# Patient Record
Sex: Female | Born: 1990
Health system: Southern US, Community
[De-identification: ages and names within clinical notes are randomized; demographics above are authoritative.]

## PROBLEM LIST (undated history)

## (undated) ENCOUNTER — Inpatient Hospital Stay (HOSPITAL_COMMUNITY): Payer: Self-pay

## (undated) DIAGNOSIS — J302 Other seasonal allergic rhinitis: Secondary | ICD-10-CM

## (undated) DIAGNOSIS — D573 Sickle-cell trait: Secondary | ICD-10-CM

## (undated) DIAGNOSIS — E669 Obesity, unspecified: Secondary | ICD-10-CM

## (undated) DIAGNOSIS — C801 Malignant (primary) neoplasm, unspecified: Secondary | ICD-10-CM

## (undated) DIAGNOSIS — E739 Lactose intolerance, unspecified: Secondary | ICD-10-CM

## (undated) DIAGNOSIS — B009 Herpesviral infection, unspecified: Secondary | ICD-10-CM

## (undated) HISTORY — DX: Lactose intolerance, unspecified: E73.9

## (undated) HISTORY — DX: Sickle-cell trait: D57.3

## (undated) HISTORY — PX: THYROIDECTOMY: SHX17

## (undated) HISTORY — PX: NO PAST SURGERIES: SHX2092

## (undated) HISTORY — DX: Herpesviral infection, unspecified: B00.9

---

## 2012-06-28 ENCOUNTER — Encounter (HOSPITAL_COMMUNITY): Payer: Self-pay | Admitting: *Deleted

## 2012-06-28 ENCOUNTER — Emergency Department (HOSPITAL_COMMUNITY)
Admission: EM | Admit: 2012-06-28 | Discharge: 2012-06-28 | Disposition: A | Discharge status: Home / Self Care | Payer: BC Managed Care – PPO | Attending: Emergency Medicine | Admitting: Emergency Medicine

## 2012-06-28 DIAGNOSIS — L509 Urticaria, unspecified: Secondary | ICD-10-CM

## 2012-06-28 HISTORY — DX: Other seasonal allergic rhinitis: J30.2

## 2012-06-28 MED ORDER — HYDROXYZINE HCL 25 MG PO TABS: 25.0000 mg | ORAL_TABLET | Freq: Three times a day (TID) | ORAL | Status: DC | PRN

## 2012-06-28 MED ORDER — HYDROXYZINE HCL 25 MG PO TABS
25.0000 mg | ORAL_TABLET | Freq: Once | ORAL | Status: AC
  Administered 2012-06-28: 25 mg via ORAL
  Filled 2012-06-28: qty 1

## 2012-06-28 NOTE — ED Provider Notes (Signed)
History   This chart was scribed for Cyndra Numbers, MD by Melba Coon. The patient was seen in room TR11C/TR11C and the patient's care was started at 4:21PM.    CSN: [id number]  Arrival date & time 06/28/12  1518   First MD Initiated Contact with Patient 06/28/12 1541      Chief Complaint  Patient presents with  . Rash    (Consider location/radiation/quality/duration/timing/severity/associated sxs/prior treatment) The history is provided by the patient. No language interpreter was used.   Barbara Moore is a 21 y.o. female who presents to the Emergency Department complaining of persistent, moderate to severe rash to the face, bilateral feet, legs, and abdomen with an onset 2 days ago. Barbara Moore states that the rash started in her face but radiated to the rest of her body. Rash is pruritic but pt has tried not to scratch the affected areas. Pt took OTC antihistamines at home which did not alleviate the symptoms. Barbara Moore states that, recently, school and work related stress have heightened around the time of onset. Reports sore throat; thought that, along her the present hives, it has been seasonal allergies that have been triggering the present symptoms. Denies nausea, vomit, SOB. Hx of chronic hives since childhood; pt states that usually stress or high emotional states will trigger the onset of hives and that usually her hives are not present in the facial area and just goes away. No known allergies to medications. No other pertinent medical symptoms.  Past Medical History  Diagnosis Date  . Seasonal allergies     History reviewed. No pertinent past surgical history.  No family history on file.  History  Substance Use Topics  . Smoking status: Never Smoker   . Smokeless tobacco: Not on file  . Alcohol Use: No    OB History    Grav Para Term Preterm Abortions TAB SAB Ect Mult Living                  Review of Systems  Skin: Positive for rash.    Allergies  Review of  patient's allergies indicates no known allergies.  Home Medications   Current Outpatient Rx  Name Route Sig Dispense Refill  . VITAMIN C PO Oral Take 1 tablet by mouth daily.    Marland Kitchen OVER THE COUNTER MEDICATION Oral Take 1 tablet by mouth every 4 (four) hours as needed. For allergies   otc allergy relief    . OVER THE COUNTER MEDICATION Oral Take 2 tablets by mouth every 6 (six) hours as needed. For allergy or sinus headache   Tylenol sinus      BP 132/77  Pulse 91  Temp 99.2 F (37.3 C) (Oral)  Resp 16  SpO2 98%  Physical Exam GEN: Well-developed, well-nourished female in no distress HEENT: Atraumatic, normocephalic. Oropharynx with erythema but no tonsillar swelling or exudates. EYES: PERRLA BL, no scleral icterus. NECK: Trachea midline, no meningismus CV: regular rate and rhythm. No murmurs, rubs, or gallops PULM: No respiratory distress.  No crackles, wheezes, or rales. GI: soft, non-tender. No guarding, rebound, or tenderness. + bowel sounds  GU: deferred Neuro: cranial nerves grossly 2-12 intact, no abnormalities of strength or sensation, A and O x 3 MSK: Patient moves all 4 extremities symmetrically, no deformity, edema, or injury noted Skin: Few scattered raised erythematous wheals at most 5 mm in diameter over the right cheek.  Patient indicates she has rash elsewhere but legs mainly appear to have evidence of excoriation Psych: no  abnormality of mood  ED Course  Procedures (including critical care time)  DIAGNOSTIC STUDIES: Oxygen Saturation is 98% on room air, normal by my interpretation.    COORDINATION OF CARE:  4:25PM - atarax will be Rx for the pt. Pt is advised to use benadryl at home for any future hive outbreaks. Barbara Moore is ready for d/c.    Labs Reviewed - No data to display No results found.   1. Urticaria       MDM  Patient with a few urticarial lesions and no findings raising concern for emergent rashes such as RMSF, SJS, TEN, or severe  allergic reaction.  Patient felt symptoms likely related to stress.  Treated with hydroxyzine and discharged with Rx for same in good condition.  I personally performed the services described in this documentation, which was scribed in my presence. The recorded information has been reviewed and considered.          Cyndra Numbers, MD 06/29/12 [phone]

## 2012-06-28 NOTE — ED Notes (Signed)
Patient states she has an outbreak of hives that started on Friday.  She has noted areas all over.  She reports she has hx of hives since childhood.

## 2013-02-16 ENCOUNTER — Emergency Department (HOSPITAL_COMMUNITY)
Admission: EM | Admit: 2013-02-16 | Discharge: 2013-02-16 | Disposition: A | Discharge status: Home / Self Care | Payer: BC Managed Care – PPO | Attending: Emergency Medicine | Admitting: Emergency Medicine

## 2013-02-16 DIAGNOSIS — I889 Nonspecific lymphadenitis, unspecified: Secondary | ICD-10-CM

## 2013-02-16 DIAGNOSIS — J302 Other seasonal allergic rhinitis: Secondary | ICD-10-CM

## 2013-02-16 DIAGNOSIS — J309 Allergic rhinitis, unspecified: Secondary | ICD-10-CM | POA: Insufficient documentation

## 2013-02-16 DIAGNOSIS — L049 Acute lymphadenitis, unspecified: Secondary | ICD-10-CM | POA: Insufficient documentation

## 2013-02-16 DIAGNOSIS — J3489 Other specified disorders of nose and nasal sinuses: Secondary | ICD-10-CM | POA: Insufficient documentation

## 2013-02-16 NOTE — ED Notes (Signed)
Pt states she noticed a bump on the R lower side of the back of her head today. States it has happened before in the same spot but has not bothered her.

## 2013-02-16 NOTE — ED Provider Notes (Signed)
History    This chart was scribed for non-physician practitioner Johnnette Gourd working with Celene Kras, MD by Quintella Reichert, ED Scribe. This patient was seen in room WTR9/WTR9 and the patient's care was started at 8:53 PM .   CSN: [id number]  Arrival date & time 02/16/13  2027      Chief Complaint  Patient presents with  . Lump on Head      The history is provided by the patient. No language interpreter was used.    HPI Comments: Barbara Moore is a 22 y.o. female who presents to the Emergency Department complaining of a bump on the right side of her neck that she noticed 7 hours ago.  Pt states that touching the bump causes pain in the area, and also causes a sensation of pressure in the right side of her face and right ear, and a feeling "like someone is pressing something through my throat."  She states that turning her head to the left also causes pain in the area of the bump.  Pt denies drainage, ear pain, fever, chills, neck stiffness, sore throat or any other associated symptoms.  Pt also notes a bump on the left side of her neck that is not painful.  She also reports rhinorrhea, eye itchiness, and mild throat itching.  Pt has seasonal allergies.  She denies h/o any other medical problems.   Past Medical History  Diagnosis Date  . Seasonal allergies     No past surgical history on file.  No family history on file.  History  Substance Use Topics  . Smoking status: Never Smoker   . Smokeless tobacco: Not on file  . Alcohol Use: No    OB History   Grav Para Term Preterm Abortions TAB SAB Ect Mult Living                  Review of Systems  Constitutional: Negative for fever and chills.  HENT: Positive for rhinorrhea. Negative for ear pain and neck stiffness.        Bump on side of neck bilaterally  Eyes: Positive for itching.  All other systems reviewed and are negative.    Allergies  Review of patient's allergies indicates no known allergies.  Home  Medications   Current Outpatient Rx  Name  Route  Sig  Dispense  Refill  . Ascorbic Acid (VITAMIN C PO)   Oral   Take 1 tablet by mouth daily.         . hydrOXYzine (ATARAX/VISTARIL) 25 MG tablet   Oral   Take 1 tablet (25 mg total) by mouth every 8 (eight) hours as needed for itching.   30 tablet   0   . OVER THE COUNTER MEDICATION   Oral   Take 1 tablet by mouth every 4 (four) hours as needed. For allergies   otc allergy relief         . OVER THE COUNTER MEDICATION   Oral   Take 2 tablets by mouth every 6 (six) hours as needed. For allergy or sinus headache   Tylenol sinus           BP 128/74  Pulse 72  Temp(Src) 98.5 F (36.9 C) (Oral)  SpO2 100%  LMP 02/14/2013  Physical Exam  Nursing note and vitals reviewed. Constitutional: She is oriented to person, place, and time. She appears well-developed and well-nourished. No distress.  HENT:  Head: Normocephalic and atraumatic.  Right Ear: External ear normal.  Left Ear: External ear normal.  Nose: Mucosal edema present.  Mouth/Throat: Oropharynx is clear and moist.  Eyes: Conjunctivae and EOM are normal.  Neck: Normal range of motion. Neck supple.  Right side: 7-mm well-circumscribed enlarged lymphnode on posterior cervical chain, tender to palpation.  Firm and mobile. Left side: 3-mm well-circumscribed lymphnode on posterior cervical chain, tender to palpation.  Firm and mobile.  Cardiovascular: Normal rate, regular rhythm and normal heart sounds.   Pulmonary/Chest: Effort normal and breath sounds normal. No respiratory distress. She has no wheezes. She has no rales.  Musculoskeletal: Normal range of motion. She exhibits no edema.  Lymphadenopathy:    She has cervical adenopathy.  Neurological: She is alert and oriented to person, place, and time. No sensory deficit.  Skin: Skin is warm and dry.  Psychiatric: She has a normal mood and affect. Her behavior is normal.    ED Course  Procedures (including  critical care time)  DIAGNOSTIC STUDIES: Oxygen Saturation is 100% on room air, normal by my interpretation.    COORDINATION OF CARE: 9:00 PM-Discussed treatment plan which includes consult with PA with pt at bedside and pt agreed to plan.      Labs Reviewed - No data to display No results found.   1. Lymphadenitis   2. Seasonal allergies       MDM  Allergies, lymphadenitis. Advised allergy pill daily, warm compresses. Afebrile, NAD. Return precautions discussed. Patient states understanding of plan and is agreeable.     I personally performed the services described in this documentation, which was scribed in my presence. The recorded information has been reviewed and is accurate.   Trevor Mace, PA-C 02/16/13 2118

## 2013-02-17 NOTE — ED Provider Notes (Signed)
Medical screening examination/treatment/procedure(s) were performed by non-physician practitioner and as supervising physician I was immediately available for consultation/collaboration.    Celene Kras, MD 02/17/13 [phone]

## 2013-10-24 ENCOUNTER — Emergency Department (HOSPITAL_COMMUNITY)
Admission: EM | Admit: 2013-10-24 | Discharge: 2013-10-24 | Disposition: A | Discharge status: Home / Self Care | Payer: BC Managed Care – PPO | Attending: Emergency Medicine | Admitting: Emergency Medicine

## 2013-10-24 ENCOUNTER — Encounter (HOSPITAL_COMMUNITY): Payer: Self-pay | Admitting: Emergency Medicine

## 2013-10-24 DIAGNOSIS — R5383 Other fatigue: Secondary | ICD-10-CM

## 2013-10-24 DIAGNOSIS — E860 Dehydration: Secondary | ICD-10-CM | POA: Insufficient documentation

## 2013-10-24 DIAGNOSIS — R5381 Other malaise: Secondary | ICD-10-CM | POA: Insufficient documentation

## 2013-10-24 DIAGNOSIS — Z8709 Personal history of other diseases of the respiratory system: Secondary | ICD-10-CM | POA: Insufficient documentation

## 2013-10-24 DIAGNOSIS — R61 Generalized hyperhidrosis: Secondary | ICD-10-CM | POA: Insufficient documentation

## 2013-10-24 DIAGNOSIS — F329 Major depressive disorder, single episode, unspecified: Secondary | ICD-10-CM | POA: Insufficient documentation

## 2013-10-24 DIAGNOSIS — R197 Diarrhea, unspecified: Secondary | ICD-10-CM | POA: Insufficient documentation

## 2013-10-24 DIAGNOSIS — Z79899 Other long term (current) drug therapy: Secondary | ICD-10-CM | POA: Insufficient documentation

## 2013-10-24 DIAGNOSIS — R109 Unspecified abdominal pain: Secondary | ICD-10-CM | POA: Insufficient documentation

## 2013-10-24 DIAGNOSIS — R002 Palpitations: Secondary | ICD-10-CM | POA: Insufficient documentation

## 2013-10-24 DIAGNOSIS — R110 Nausea: Secondary | ICD-10-CM | POA: Insufficient documentation

## 2013-10-24 DIAGNOSIS — F3289 Other specified depressive episodes: Secondary | ICD-10-CM | POA: Insufficient documentation

## 2013-10-24 DIAGNOSIS — R0602 Shortness of breath: Secondary | ICD-10-CM | POA: Insufficient documentation

## 2013-10-24 LAB — CBC WITH DIFFERENTIAL/PLATELET
BASOS ABS: 0 10*3/uL (ref 0.0–0.1)
Basophils Relative: 0 % (ref 0–1)
EOS PCT: 1 % (ref 0–5)
Eosinophils Absolute: 0.1 10*3/uL (ref 0.0–0.7)
HEMATOCRIT: 35.3 % — AB (ref 36.0–46.0)
HEMOGLOBIN: 12.2 g/dL (ref 12.0–15.0)
LYMPHS PCT: 31 % (ref 12–46)
Lymphs Abs: 2.2 10*3/uL (ref 0.7–4.0)
MCH: 27.9 pg (ref 26.0–34.0)
MCHC: 34.6 g/dL (ref 30.0–36.0)
MCV: 80.6 fL (ref 78.0–100.0)
MONO ABS: 0.3 10*3/uL (ref 0.1–1.0)
MONOS PCT: 5 % (ref 3–12)
Neutro Abs: 4.6 10*3/uL (ref 1.7–7.7)
Neutrophils Relative %: 63 % (ref 43–77)
Platelets: 363 10*3/uL (ref 150–400)
RBC: 4.38 MIL/uL (ref 3.87–5.11)
RDW: 14.7 % (ref 11.5–15.5)
WBC: 7.2 10*3/uL (ref 4.0–10.5)

## 2013-10-24 LAB — URINALYSIS, ROUTINE W REFLEX MICROSCOPIC
Bilirubin Urine: NEGATIVE
Glucose, UA: NEGATIVE mg/dL
Hgb urine dipstick: NEGATIVE
Ketones, ur: NEGATIVE mg/dL
Leukocytes, UA: NEGATIVE
NITRITE: NEGATIVE
PROTEIN: NEGATIVE mg/dL
SPECIFIC GRAVITY, URINE: 1.026 (ref 1.005–1.030)
UROBILINOGEN UA: 0.2 mg/dL (ref 0.0–1.0)
pH: 6 (ref 5.0–8.0)

## 2013-10-24 LAB — COMPREHENSIVE METABOLIC PANEL
ALT: 16 U/L (ref 0–35)
AST: 12 U/L (ref 0–37)
Albumin: 4 g/dL (ref 3.5–5.2)
Alkaline Phosphatase: 99 U/L (ref 39–117)
BILIRUBIN TOTAL: 0.3 mg/dL (ref 0.3–1.2)
BUN: 11 mg/dL (ref 6–23)
CALCIUM: 9.5 mg/dL (ref 8.4–10.5)
CO2: 26 meq/L (ref 19–32)
CREATININE: 0.71 mg/dL (ref 0.50–1.10)
Chloride: 100 mEq/L (ref 96–112)
GLUCOSE: 100 mg/dL — AB (ref 70–99)
Potassium: 3.7 mEq/L (ref 3.7–5.3)
Sodium: 140 mEq/L (ref 137–147)
Total Protein: 7.8 g/dL (ref 6.0–8.3)

## 2013-10-24 LAB — TROPONIN I

## 2013-10-24 MED ORDER — SODIUM CHLORIDE 0.9 % IV SOLN
INTRAVENOUS | Status: DC
  Administered 2013-10-24: 09:00:00 via INTRAVENOUS

## 2013-10-24 MED ORDER — SODIUM CHLORIDE 0.9 % IV BOLUS (SEPSIS)
1000.0000 mL | Freq: Once | INTRAVENOUS | Status: AC
  Administered 2013-10-24: 1000 mL via INTRAVENOUS

## 2013-10-24 MED ORDER — ONDANSETRON HCL 4 MG/2ML IJ SOLN
4.0000 mg | Freq: Once | INTRAMUSCULAR | Status: AC
  Administered 2013-10-24: 4 mg via INTRAVENOUS
  Filled 2013-10-24: qty 2

## 2013-10-24 MED ORDER — ONDANSETRON HCL 4 MG PO TABS: 4.0000 mg | ORAL_TABLET | Freq: Three times a day (TID) | ORAL | Status: DC | PRN

## 2013-10-24 NOTE — ED Provider Notes (Signed)
CSN: [id number]     Arrival date & time 10/24/13  0736 History   First MD Initiated Contact with Patient 10/24/13 0750     Chief Complaint  Patient presents with  . possible med reaction from SSRI   . Nausea  . Diarrhea   (Consider location/radiation/quality/duration/timing/severity/associated sxs/prior Treatment) HPI Patient reports she started being treated for depression at her school. She states she was started on Prozac in November. She was taking two 20 mg tablets daily in the afternoon. She reports an hour after she takes them she starts feeling better with improved mood. It does not change her physical feelings such as nausea, vomiting, dizziness. She states she stopped for about 4 weeks in December. She restarted about 10 days ago but instead of starting it to 10 mg once a day like she did when she first started in November she started taking 2 of the 20 mg tablets and yesterday took her first 20 mg tablet at about 3:30 PM. She reports an hour later she started breaking out in a sweat, she felt like her heart was racing and she felt faint. She had nausea without vomiting. She states she felt weak and tense. She states she was hallucinating however she was not hallucinating. She states she felt disoriented meaning that when she was doing her orders at work she forgot what she had done or what she did not do. She states she felt like she was going to pass out. She states that bad  feeling lasted about 2 hours. She drank espresso hoping that would help. She states she's been taking care of her mother who had surgery recently. She has not been sleeping well. She states she usually drinks one 16 ounce coffee a day that she makes herself in the morning. Yesterday however she drank her coffee and had 4 shots of espresso and drank a 16 ounce [employer]. She states she has had diarrhea about 3-4 times a day described as watery for the past 3 days. She has some lower abdominal cramping before she has  diarrhea. She states she did have some shortness of breath with palpitations. She denies cough, sore throat, fever, or chest pain. She also states her legs and arms have been itching with some small red areas on her skin. She denies feeling this way before. She denies being around anybody else that she knows is ill. She states she still has some nausea and feels weak.  Patient reports the Prozac has helped with her depression. She does not feel like she needs to talk to anybody today. She denies suicidal or homicidal ideation.   PCP none [facility]iat[facility]  Past Medical History  Diagnosis Date  . Seasonal allergies    History reviewed. No pertinent past surgical history. History reviewed. No pertinent family history. History  Substance Use Topics  . Smoking status: Never Smoker   . Smokeless tobacco: Not on file  . Alcohol Use: No  [employer] [place] [employer]  OB History   Grav Para Term Preterm Abortions TAB SAB Ect Mult Living                 Review of Systems  All other systems reviewed and are negative.    Allergies  Lactose intolerance (gi)  Home Medications   Current Outpatient Rx  Name  Route  Sig  Dispense  Refill  . FLUoxetine (PROZAC) 10 MG capsule   Oral   Take  10 mg by mouth 2 (two) times daily.          Marland Kitchen ibuprofen (ADVIL,MOTRIN) 200 MG tablet   Oral   Take 200 mg by mouth every 6 (six) hours as needed for pain.         Marland Kitchen ondansetron (ZOFRAN) 4 MG tablet   Oral   Take 1 tablet (4 mg total) by mouth every 8 (eight) hours as needed for nausea or vomiting.   4 tablet   0      ED Triage Vitals  Enc Vitals Group     BP 10/24/13 0743 116/86 mmHg     Pulse Rate 10/24/13 0743 71     Resp 10/24/13 0743 16     Temp 10/24/13 0743 98.1 F (36.7 C)     Temp src --      SpO2 10/24/13 0743 95 %     Weight 10/24/13 0743 285 lb (129.275 kg)     Height 10/24/13 0743 5\' 5"  (1.651 m)     Head Cir --      Peak Flow --       Pain Score --      Pain Loc --      Pain Edu? --      Excl. in GC? --      Vital signs normal   Physical Exam  Nursing note and vitals reviewed. Constitutional: She is oriented to person, place, and time. She appears well-developed and well-nourished.  Non-toxic appearance. She does not appear ill. No distress.  HENT:  Head: Normocephalic and atraumatic.  Right Ear: External ear normal.  Left Ear: External ear normal.  Nose: Nose normal. No mucosal edema or rhinorrhea.  Mouth/Throat: Oropharynx is clear and moist and mucous membranes are normal. No dental abscesses or uvula swelling.  Eyes: Conjunctivae and EOM are normal. Pupils are equal, round, and reactive to light.  Neck: Normal range of motion and full passive range of motion without pain. Neck supple.  Cardiovascular: Normal rate, regular rhythm and normal heart sounds.  Exam reveals no gallop and no friction rub.   No murmur heard. Pulmonary/Chest: Effort normal and breath sounds normal. No respiratory distress. She has no wheezes. She has no rhonchi. She has no rales. She exhibits no tenderness and no crepitus.  Abdominal: Soft. Normal appearance and bowel sounds are normal. She exhibits no distension. There is no tenderness. There is no rebound and no guarding.  Musculoskeletal: Normal range of motion. She exhibits no edema and no tenderness.  Moves all extremities well.   Neurological: She is alert and oriented to person, place, and time. She has normal strength. No cranial nerve deficit.  Skin: Skin is warm, dry and intact. No rash noted. No erythema. No pallor.  Psychiatric: She has a normal mood and affect. Her speech is normal and behavior is normal. Her mood appears not anxious.  Pt seems tense    ED Course  Procedures (including critical care time)  Medications  0.9 %  sodium chloride infusion ( Intravenous New Bag/Given 10/24/13 0842)  ondansetron (ZOFRAN) injection 4 mg (4 mg Intravenous Given 10/24/13 0846)   sodium chloride 0.9 % bolus 1,000 mL (1,000 mLs Intravenous New Bag/Given 10/24/13 0945)    I looked at her pill bottles. Both bottles are the original manufacturer's bottles, one with 10 mg tablets, one with 20 mg tablets. I looked on finder and the 20 mg tablets are Prozac. The instructions on the 10 mg bottles state to take  10 mg once a day for 7 days then increase to 20 mg daily. Patient did not do that taper when she restarted taking her Prozac after being off for 4 weeks. We discussed she needs to not take it today and then when she restarts [the 10 mg dose for a week. She still has nine of the 10 mg tablets in her bottle.  11:30 pt is feeling well, has had her bolus of fluids. Feels ready to go home. We discussed her Prozac. She is not to take it today and start the 10 mg dose tomorrow for a week then increase to the 20 mg dose. However if she feels bad at any point on restarting the Prozac she is to stop taking it. She should let her psychiatrist know if she's unable to tolerate the Prozac. It is unclear whether she had serotonin syndrome because she did have diarrhea for a couple days before the acute symptoms started yesterday. However a lot of her symptoms are suggestive of serotonin syndrome. She however had the symptoms yesterday. Today she does not have tachycardia, fever, dilated pupils, or diaphoresis. She denies taking any over-the-counter medications.  Labs Review Results for orders placed during the hospital encounter of 10/24/13  CBC WITH DIFFERENTIAL      Result Value Range   WBC 7.2  4.0 - 10.5 K/uL   RBC 4.38  3.87 - 5.11 MIL/uL   Hemoglobin 12.2  12.0 - 15.0 g/dL   HCT 38.7 (*) 56.4 - 33.2 %   MCV 80.6  78.0 - 100.0 fL   MCH 27.9  26.0 - 34.0 pg   MCHC 34.6  30.0 - 36.0 g/dL   RDW 95.1  88.4 - 16.6 %   Platelets 363  150 - 400 K/uL   Neutrophils Relative % 63  43 - 77 %   Neutro Abs 4.6  1.7 - 7.7 K/uL   Lymphocytes Relative 31  12 - 46 %   Lymphs Abs 2.2  0.7 - 4.0  K/uL   Monocytes Relative 5  3 - 12 %   Monocytes Absolute 0.3  0.1 - 1.0 K/uL   Eosinophils Relative 1  0 - 5 %   Eosinophils Absolute 0.1  0.0 - 0.7 K/uL   Basophils Relative 0  0 - 1 %   Basophils Absolute 0.0  0.0 - 0.1 K/uL  COMPREHENSIVE METABOLIC PANEL      Result Value Range   Sodium 140  137 - 147 mEq/L   Potassium 3.7  3.7 - 5.3 mEq/L   Chloride 100  96 - 112 mEq/L   CO2 26  19 - 32 mEq/L   Glucose, Bld 100 (*) 70 - 99 mg/dL   BUN 11  6 - 23 mg/dL   Creatinine, Ser 0.63  0.50 - 1.10 mg/dL   Calcium 9.5  8.4 - 01.6 mg/dL   Total Protein 7.8  6.0 - 8.3 g/dL   Albumin 4.0  3.5 - 5.2 g/dL   AST 12  0 - 37 U/L   ALT 16  0 - 35 U/L   Alkaline Phosphatase 99  39 - 117 U/L   Total Bilirubin 0.3  0.3 - 1.2 mg/dL   GFR calc non Af Amer >90  >90 mL/min   GFR calc Af Amer >90  >90 mL/min  URINALYSIS, ROUTINE W REFLEX MICROSCOPIC      Result Value Range   Color, Urine YELLOW  YELLOW   APPearance CLEAR  CLEAR   Specific  Gravity, Urine 1.026  1.005 - 1.030   pH 6.0  5.0 - 8.0   Glucose, UA NEGATIVE  NEGATIVE mg/dL   Hgb urine dipstick NEGATIVE  NEGATIVE   Bilirubin Urine NEGATIVE  NEGATIVE   Ketones, ur NEGATIVE  NEGATIVE mg/dL   Protein, ur NEGATIVE  NEGATIVE mg/dL   Urobilinogen, UA 0.2  0.0 - 1.0 mg/dL   Nitrite NEGATIVE  NEGATIVE   Leukocytes, UA NEGATIVE  NEGATIVE  TROPONIN I      Result Value Range   Troponin I <0.30  <0.30 ng/mL   Laboratory interpretation all normal    Imaging Review No results found.  EKG Interpretation    Date/Time:  Sunday October 24 2013 08:09:00 EST Ventricular Rate:  53 PR Interval:  145 QRS Duration: 86 QT Interval:  404 QTC Calculation: 379 R Axis:   56 Text Interpretation:  Sinus bradycardia Otherwise within normal limits No old tracing to compare Confirmed by Yadriel Kerrigan  MD-I, Tamela Elsayed ([id number]) on 10/24/2013 8:30:39 AM            MDM   1. Nausea   2. Diarrhea   3. Dehydration     New Prescriptions   ONDANSETRON (ZOFRAN) 4 MG  TABLET    Take 1 tablet (4 mg total) by mouth every 8 (eight) hours as needed for nausea or vomiting.    Plan discharge   Devoria Albe, MD, Franz Dell, MD 10/24/13 [phone]

## 2013-10-24 NOTE — ED Notes (Signed)
Pt calm and cooperative, denies SI. Pt reports she took Prozac for 1 month from Nov 2014- December 2014. Pt stopped on her own after 1 month. Pt went back to her pcp and started on prozac 10/14/13. Pt reports for last two days she has had nausea, diarrhea, upset stomach. Pt unsure if she is having side effects of medicine. Reports yesterday she had an episode of heart racing, sweating, feeling faint with nausea.

## 2013-10-24 NOTE — Discharge Instructions (Signed)
Don't take the Prozac today. Tomorrow start back at the 10 mg tab a day for 7 days then go to the 20 mg tab a day. If you feel worse at any time, stop taking the Prozac and let your psychiatrist know you can't tolerate it.  Return to the ED if you feel worse. Avoid milk products until the diarrhea is gone, you can take imodium OTC for diarrhea if needed. Use the zofran for any lingering nausea.

## 2014-02-22 ENCOUNTER — Encounter (HOSPITAL_COMMUNITY): Payer: Self-pay | Admitting: Emergency Medicine

## 2014-02-22 ENCOUNTER — Emergency Department (INDEPENDENT_AMBULATORY_CARE_PROVIDER_SITE_OTHER)
Admission: EM | Admit: 2014-02-22 | Discharge: 2014-02-22 | Disposition: A | Discharge status: Home / Self Care | Payer: 59 | Source: Home / Self Care | Attending: Emergency Medicine | Admitting: Emergency Medicine

## 2014-02-22 DIAGNOSIS — R109 Unspecified abdominal pain: Secondary | ICD-10-CM | POA: Diagnosis present

## 2014-02-22 DIAGNOSIS — R10A0 Flank pain, unspecified side: Secondary | ICD-10-CM | POA: Diagnosis present

## 2014-02-22 LAB — POCT URINALYSIS DIP (DEVICE)
Bilirubin Urine: NEGATIVE
GLUCOSE, UA: NEGATIVE mg/dL
Hgb urine dipstick: NEGATIVE
Ketones, ur: NEGATIVE mg/dL
Leukocytes, UA: NEGATIVE
NITRITE: NEGATIVE
PROTEIN: NEGATIVE mg/dL
Specific Gravity, Urine: 1.025 (ref 1.005–1.030)
UROBILINOGEN UA: 0.2 mg/dL (ref 0.0–1.0)
pH: 6.5 (ref 5.0–8.0)

## 2014-02-22 LAB — POCT PREGNANCY, URINE: PREG TEST UR: NEGATIVE

## 2014-02-22 NOTE — Discharge Instructions (Signed)
Dorene GrebeNatalie,   I think that you are doing well overall. The left sided pain is due to bruising of the fatty tissue, but I feel no other abnormalities. I did not find anything concerning on your abdominal or pelvic exam. Therefore we should continue to monitor this pain. It will likely resolve on its own. If it worsens, becomes associated with fever, or nausea and vomiting, then please return.  Sincerely,  Dr. Clinton SawyerWilliamson

## 2014-02-22 NOTE — ED Notes (Signed)
C/o Right side of back pain radiating to abd area States it hurts to move or use right side  Ice pack, ibuprofen was used as tx

## 2014-02-22 NOTE — ED Provider Notes (Signed)
CSN: [id number]     Arrival date & time 02/22/14  1713 History   First MD Initiated Contact with Patient 02/22/14 1735     Chief Complaint  Patient presents with  . Back Pain   (Consider location/radiation/quality/duration/timing/severity/associated sxs/prior Treatment) HPI  Abdominal and flank pain:  Left sided flank pain, 2 days duration, associated with nausea and indigestion, no vomiting, fever, or dysuria; radiates around right lower back; worsened with moving to the left; alleviated with rest and ibuprofen (took 600 mg in last 24 hours); no recent trauma, no history of kidney stones or pyelonephritis  Not sexually active. No vaginal discharge.   Past Medical History  Diagnosis Date  . Seasonal allergies    History reviewed. No pertinent past surgical history. History reviewed. No pertinent family history. History  Substance Use Topics  . Smoking status: Never Smoker   . Smokeless tobacco: Not on file  . Alcohol Use: No   OB History   Grav Para Term Preterm Abortions TAB SAB Ect Mult Living                 Review of Systems Constipation or diarrhea; otherwise see history of present illness Allergies  Lactose intolerance (gi)  Home Medications   Prior to Admission medications   Medication Sig Start Date End Date Taking? Authorizing Provider  FLUoxetine (PROZAC) 10 MG capsule Take 10 mg by mouth 2 (two) times daily.     Historical Provider, MD  ibuprofen (ADVIL,MOTRIN) 200 MG tablet Take 200 mg by mouth every 6 (six) hours as needed for pain.    Historical Provider, MD  ondansetron (ZOFRAN) 4 MG tablet Take 1 tablet (4 mg total) by mouth every 8 (eight) hours as needed for nausea or vomiting. 10/24/13   Ward GivensIva L Knapp, MD   BP 127/83  Pulse 81  Temp(Src) 98.1 F (36.7 C) (Oral)  Resp 14  SpO2 97%  LMP 02/08/2014 Physical Exam Gen: Obese female, not appearing, pleasant and conversant Back: no CVA tenderness Flank: tenderness of left sided panus without palpable  abnormality of adipose tissue Abd: tenderness to palpation of RLQ and LLQ; abdomen too obese to appreciated masses or liver edge  GU: > External: no lesions > Vagina: no blood in vault > Cervix: no lesion; no mucopurulent d/c; no motion tenderness > Uterus: small, mobile > Adnexa: no masses; non tender    ED Course  Procedures (including critical care time) Labs Review Labs Reviewed  POCT URINALYSIS DIP (DEVICE)  POCT PREGNANCY, URINE    Imaging Review No results found.   MDM   1. Flank pain   2. Abdominal pain    1. Flank pain - bruising of adipose tissue of unclear etiology, no indication for further eval 2. Abdominal pain - no acute abdomen, no evidence of PID, no need for further eval, cont to monitor    Garnetta BuddyEdward V Arcenio Mullaly, MD 02/22/14 2133

## 2014-02-24 NOTE — ED Provider Notes (Signed)
Medical screening examination/treatment/procedure(s) were performed by a resident physician and as supervising physician I was immediately available for consultation/collaboration.  Leslee Homeavid Arwin Bisceglia, M.D.   Reuben Likesavid C Travin Marik, MD 02/24/14 2236

## 2014-09-09 ENCOUNTER — Emergency Department (HOSPITAL_COMMUNITY)
Admission: EM | Admit: 2014-09-09 | Discharge: 2014-09-09 | Disposition: A | Discharge status: Home / Self Care | Payer: 59 | Attending: Emergency Medicine | Admitting: Emergency Medicine

## 2014-09-09 ENCOUNTER — Encounter (HOSPITAL_COMMUNITY): Payer: Self-pay | Admitting: Emergency Medicine

## 2014-09-09 DIAGNOSIS — R197 Diarrhea, unspecified: Secondary | ICD-10-CM | POA: Insufficient documentation

## 2014-09-09 DIAGNOSIS — Z79899 Other long term (current) drug therapy: Secondary | ICD-10-CM | POA: Diagnosis not present

## 2014-09-09 DIAGNOSIS — R350 Frequency of micturition: Secondary | ICD-10-CM | POA: Diagnosis not present

## 2014-09-09 DIAGNOSIS — R609 Edema, unspecified: Secondary | ICD-10-CM | POA: Diagnosis not present

## 2014-09-09 DIAGNOSIS — M7989 Other specified soft tissue disorders: Secondary | ICD-10-CM | POA: Diagnosis present

## 2014-09-09 DIAGNOSIS — Z3202 Encounter for pregnancy test, result negative: Secondary | ICD-10-CM | POA: Diagnosis not present

## 2014-09-09 DIAGNOSIS — R5381 Other malaise: Secondary | ICD-10-CM | POA: Diagnosis not present

## 2014-09-09 DIAGNOSIS — K5900 Constipation, unspecified: Secondary | ICD-10-CM | POA: Insufficient documentation

## 2014-09-09 DIAGNOSIS — L219 Seborrheic dermatitis, unspecified: Secondary | ICD-10-CM | POA: Insufficient documentation

## 2014-09-09 DIAGNOSIS — R5383 Other fatigue: Secondary | ICD-10-CM | POA: Insufficient documentation

## 2014-09-09 LAB — URINALYSIS, ROUTINE W REFLEX MICROSCOPIC
BILIRUBIN URINE: NEGATIVE
Glucose, UA: NEGATIVE mg/dL
HGB URINE DIPSTICK: NEGATIVE
KETONES UR: NEGATIVE mg/dL
Leukocytes, UA: NEGATIVE
NITRITE: NEGATIVE
PH: 7 (ref 5.0–8.0)
Protein, ur: NEGATIVE mg/dL
SPECIFIC GRAVITY, URINE: 1.027 (ref 1.005–1.030)
UROBILINOGEN UA: 1 mg/dL (ref 0.0–1.0)

## 2014-09-09 LAB — TSH: TSH: 0.751 u[IU]/mL (ref 0.350–4.500)

## 2014-09-09 LAB — BASIC METABOLIC PANEL
Anion gap: 12 (ref 5–15)
BUN: 10 mg/dL (ref 6–23)
CALCIUM: 9.7 mg/dL (ref 8.4–10.5)
CO2: 26 mEq/L (ref 19–32)
CREATININE: 0.63 mg/dL (ref 0.50–1.10)
Chloride: 103 mEq/L (ref 96–112)
GLUCOSE: 90 mg/dL (ref 70–99)
Potassium: 4.2 mEq/L (ref 3.7–5.3)
Sodium: 141 mEq/L (ref 137–147)

## 2014-09-09 LAB — CBC WITH DIFFERENTIAL/PLATELET
BASOS PCT: 0 % (ref 0–1)
Basophils Absolute: 0 10*3/uL (ref 0.0–0.1)
EOS ABS: 0.1 10*3/uL (ref 0.0–0.7)
EOS PCT: 1 % (ref 0–5)
HCT: 36 % (ref 36.0–46.0)
HEMOGLOBIN: 12.2 g/dL (ref 12.0–15.0)
LYMPHS ABS: 1.8 10*3/uL (ref 0.7–4.0)
Lymphocytes Relative: 27 % (ref 12–46)
MCH: 27.5 pg (ref 26.0–34.0)
MCHC: 33.9 g/dL (ref 30.0–36.0)
MCV: 81.1 fL (ref 78.0–100.0)
MONOS PCT: 4 % (ref 3–12)
Monocytes Absolute: 0.3 10*3/uL (ref 0.1–1.0)
NEUTROS PCT: 68 % (ref 43–77)
Neutro Abs: 4.5 10*3/uL (ref 1.7–7.7)
Platelets: 332 10*3/uL (ref 150–400)
RBC: 4.44 MIL/uL (ref 3.87–5.11)
RDW: 14.8 % (ref 11.5–15.5)
WBC: 6.7 10*3/uL (ref 4.0–10.5)

## 2014-09-09 LAB — PREGNANCY, URINE: PREG TEST UR: NEGATIVE

## 2014-09-09 NOTE — ED Notes (Addendum)
Pt c/o bilat lower extremity swelling that has been going on for 3-4 months. Pt also c/o weight gain over the past several months.  Pt states that she hasnt had a period in 2 months, "but i know im not pregnant".  Pt also c/o mood swings that have been going on for past 2 months.   Pt's other c/o is dry scalp that has been going on for about 3 months.  Pt states that she has tried different shampoos but havent helped.

## 2014-09-09 NOTE — Discharge Instructions (Signed)
The cause of your symptoms was not identified today in the Emergency Department.  Your thyroid studies were ordered but will not be available right away.  You can follow up with your family doctor, or request your medical records for official results.    Edema Edema is an abnormal buildup of fluids in your bodytissues. Edema is somewhatdependent on gravity to pull the fluid to the lowest place in your body. That makes the condition more common in the legs and thighs (lower extremities). Painless swelling of the feet and ankles is common and becomes more likely as you get older. It is also common in looser tissues, like around your eyes.  When the affected area is squeezed, the fluid may move out of that spot and leave a dent for a few moments. This dent is called pitting.  CAUSES  There are many possible causes of edema. Eating too much salt and being on your feet or sitting for a long time can cause edema in your legs and ankles. Hot weather may make edema worse. Common medical causes of edema include:  Heart failure.  Liver disease.  Kidney disease.  Weak blood vessels in your legs.  Cancer.  An injury.  Pregnancy.  Some medications.  Obesity. SYMPTOMS  Edema is usually painless.Your skin may look swollen or shiny.  DIAGNOSIS  Your health care provider may be able to diagnose edema by asking about your medical history and doing a physical exam. You may need to have tests such as X-rays, an electrocardiogram, or blood tests to check for medical conditions that may cause edema.  TREATMENT  Edema treatment depends on the cause. If you have heart, liver, or kidney disease, you need the treatment appropriate for these conditions. General treatment may include:  Elevation of the affected body part above the level of your heart.  Compression of the affected body part. Pressure from elastic bandages or support stockings squeezes the tissues and forces fluid back into the blood  vessels. This keeps fluid from entering the tissues.  Restriction of fluid and salt intake.  Use of a water pill (diuretic). These medications are appropriate only for some types of edema. They pull fluid out of your body and make you urinate more often. This gets rid of fluid and reduces swelling, but diuretics can have side effects. Only use diuretics as directed by your health care provider. HOME CARE INSTRUCTIONS   Keep the affected body part above the level of your heart when you are lying down.   Do not sit still or stand for prolonged periods.   Do not put anything directly under your knees when lying down.  Do not wear constricting clothing or garters on your upper legs.   Exercise your legs to work the fluid back into your blood vessels. This may help the swelling go down.   Wear elastic bandages or support stockings to reduce ankle swelling as directed by your health care provider.   Eat a low-salt diet to reduce fluid if your health care provider recommends it.   Only take medicines as directed by your health care provider. SEEK MEDICAL CARE IF:   Your edema is not responding to treatment.  You have heart, liver, or kidney disease and notice symptoms of edema.  You have edema in your legs that does not improve after elevating them.   You have sudden and unexplained weight gain. SEEK IMMEDIATE MEDICAL CARE IF:   You develop shortness of breath or chest pain.  You cannot breathe when you lie down.  You develop pain, redness, or warmth in the swollen areas.   You have heart, liver, or kidney disease and suddenly get edema.  You have a fever and your symptoms suddenly get worse. MAKE SURE YOU:   Understand these instructions.  Will watch your condition.  Will get help right away if you are not doing well or get worse. Document Released: 09/23/2005 Document Revised: 02/07/2014 Document Reviewed: 07/16/2013 [facility] Patient Information 2015  [employer] [place]. This information is not intended to replace advice given to you by your health care provider. Make sure you discuss any questions you have with your health care provider. .res

## 2014-09-09 NOTE — ED Notes (Signed)
Pt alert, nad, resp even unlabored, skin pwd, denies needs, ambulates to discharge 

## 2014-09-09 NOTE — ED Provider Notes (Signed)
CSN: [id number]     Arrival date & time 09/09/14  1230 History   First MD Initiated Contact with Patient 09/09/14 1417     Chief Complaint  Patient presents with  . mood swings   . Leg Swelling  . dry scalp      The history is provided by the patient.   [name] presents for 2 months of progressive weight gain, fatigue, swelling in her legs at the end of the day, and dry scalp and dandruff. She reports she often feels bloated. She denies chance of pregnancy as she is not sexually active. She has not had her cycle for last 2 months. She reports intermittent diarrhea and constipation. She reports urinary frequency. Her symptoms are mild to moderate, progressive and worsening. He denies any chest pain, shortness of breath, fevers, vomiting, abdominal pain. She has no known medical problems.  Past Medical History  Diagnosis Date  . Seasonal allergies    History reviewed. No pertinent past surgical history. No family history on file. History  Substance Use Topics  . Smoking status: Never Smoker   . Smokeless tobacco: Not on file  . Alcohol Use: Yes     Comment: social    OB History    No data available     Review of Systems  All other systems reviewed and are negative.     Allergies  Lactose intolerance (gi)  Home Medications   Prior to Admission medications   Medication Sig Start Date End Date Taking? Authorizing Provider  ibuprofen (ADVIL,MOTRIN) 200 MG tablet Take 600 mg by mouth every 6 (six) hours as needed for moderate pain (pain).    Yes Historical Provider, MD  FLUoxetine (PROZAC) 10 MG capsule Take 10 mg by mouth 2 (two) times daily.     Historical Provider, MD  ondansetron (ZOFRAN) 4 MG tablet Take 1 tablet (4 mg total) by mouth every 8 (eight) hours as needed for nausea or vomiting. 10/24/13   Ward GivensIva L Knapp, MD   BP 111/67 mmHg  Pulse 81  Temp(Src) 98.2 F (36.8 C) (Oral)  Resp 19  Wt 318 lb 8 oz (144.471 kg)  SpO2 96%  LMP 07/10/2014 Physical Exam   Constitutional: She is oriented to person, place, and time. She appears well-developed and well-nourished.  HENT:  Head: Normocephalic and atraumatic.  Seborrhea of scalp  Cardiovascular: Normal rate and regular rhythm.   No murmur heard. Pulmonary/Chest: Effort normal and breath sounds normal. No respiratory distress.  Abdominal: Soft. There is no tenderness. There is no rebound and no guarding.  Musculoskeletal: She exhibits no tenderness.  Trace nonpitting edema in BLE  Neurological: She is alert and oriented to person, place, and time.  Skin: Skin is warm and dry.  Psychiatric: She has a normal mood and affect. Her behavior is normal.  Nursing note and vitals reviewed.   ED Course  Procedures (including critical care time) Labs Review Labs Reviewed  PREGNANCY, URINE  URINALYSIS, ROUTINE W REFLEX MICROSCOPIC  CBC WITH DIFFERENTIAL  BASIC METABOLIC PANEL  TSH    Imaging Review No results found.   EKG Interpretation None      MDM   Final diagnoses:  Peripheral edema  Malaise and fatigue    Patient here with fatigue and peripheral edema. Patient is nontoxic on exam. Clinical picture is not consistent with myxedema coma, acute renal failure, congestive heart failure, DVT. Discussed with patient that she needs to follow up with her primary care physician, providing resources for  outpatient follow-up.    Tilden FossaElizabeth Corsica Franson, MD 09/09/14 1556

## 2014-10-18 ENCOUNTER — Emergency Department (HOSPITAL_COMMUNITY)
Admission: EM | Admit: 2014-10-18 | Discharge: 2014-10-19 | Disposition: A | Discharge status: Home / Self Care | Payer: 59 | Attending: Emergency Medicine | Admitting: Emergency Medicine

## 2014-10-18 ENCOUNTER — Encounter (HOSPITAL_COMMUNITY): Payer: Self-pay | Admitting: *Deleted

## 2014-10-18 ENCOUNTER — Emergency Department (HOSPITAL_COMMUNITY): Payer: 59

## 2014-10-18 DIAGNOSIS — Z3202 Encounter for pregnancy test, result negative: Secondary | ICD-10-CM | POA: Insufficient documentation

## 2014-10-18 DIAGNOSIS — R52 Pain, unspecified: Secondary | ICD-10-CM

## 2014-10-18 DIAGNOSIS — Z79899 Other long term (current) drug therapy: Secondary | ICD-10-CM | POA: Insufficient documentation

## 2014-10-18 DIAGNOSIS — M545 Low back pain: Secondary | ICD-10-CM | POA: Insufficient documentation

## 2014-10-18 NOTE — ED Provider Notes (Signed)
CSN: [id number]     Arrival date & time 10/18/14  2100 History  This chart was scribed for non-physician practitioner, Arman FilterGail K Everado Pillsbury, NP, working with Purvis SheffieldForrest Harrison, MD, by [name], ED Scribe. This patient was seen in room WTR7/WTR7 and the patient's care was started at 10:44 PM.   Chief Complaint  Patient presents with  . Leg Pain   The history is provided by the patient. No language interpreter was used.   HPI Comments: Barbara Lazaratalie Darko is a 24 y.o. female who presents to the Emergency Department complaining of constant moderate left sided groin pain that started last night. She states that her pain started last night while she was going to bed and worsened today at work. She denies any falls or trauma. She reports that the twisting pain radiates to her left buttock and lower back. She denies any possible pregnancy.   Past Medical History  Diagnosis Date  . Seasonal allergies    History reviewed. No pertinent past surgical history. No family history on file. History  Substance Use Topics  . Smoking status: Never Smoker   . Smokeless tobacco: Not on file  . Alcohol Use: Yes     Comment: social    OB History    No data available     Review of Systems  Musculoskeletal: Positive for myalgias and back pain. Negative for joint swelling.  Neurological: Negative for weakness and numbness.  All other systems reviewed and are negative.   Allergies  Lactose intolerance (gi)  Home Medications   Prior to Admission medications   Medication Sig Start Date End Date Taking? Authorizing Provider  acetaminophen (TYLENOL) 500 MG tablet Take 1,000 mg by mouth every 6 (six) hours as needed for moderate pain.   Yes Historical Provider, MD  FLUoxetine (PROZAC) 20 MG capsule Take 20 mg by mouth daily.   Yes Historical Provider, MD  ibuprofen (ADVIL,MOTRIN) 200 MG tablet Take 600 mg by mouth every 6 (six) hours as needed for moderate pain (pain).    Yes Historical Provider, MD  Multiple  Vitamin (MULTIVITAMIN WITH MINERALS) TABS tablet Take 1 tablet by mouth daily.   Yes Historical Provider, MD  ondansetron (ZOFRAN) 4 MG tablet Take 1 tablet (4 mg total) by mouth every 8 (eight) hours as needed for nausea or vomiting. Patient not taking: Reported on 10/18/2014 10/24/13   Ward GivensIva L Knapp, MD   LMP 07/18/2014 Physical Exam  Constitutional: She is oriented to person, place, and time. She appears well-developed and well-nourished. No distress.  Morbidly obese  HENT:  Head: Normocephalic and atraumatic.  Neck: Neck supple. No tracheal deviation present.  Cardiovascular: Normal rate.   Pulmonary/Chest: Effort normal. No respiratory distress.  Musculoskeletal: Normal range of motion. She exhibits tenderness.  TTP from theLumbar spine to groin. TTP in the left mid gluteal area.  Exam limited by body habitus  Neurological: She is alert and oriented to person, place, and time.  Skin: Skin is warm and dry.  Psychiatric: She has a normal mood and affect. Her behavior is normal.  Nursing note and vitals reviewed.   ED Course  Procedures (including critical care time) DIAGNOSTIC STUDIES:   COORDINATION OF CARE: 10:48 PM- Pt advised of plan for treatment and pt agrees.  Labs Review Labs Reviewed - No data to display  Imaging Review No results found.   EKG Interpretation None      MDM   Final diagnoses:  None    I personally performed the services described  in this documentation, which was scribed in my presence. The recorded information has been reviewed and is accurate.    Arman Filter, NP 10/18/14 2350  Purvis Sheffield, MD 10/19/14 1134

## 2014-10-18 NOTE — ED Notes (Signed)
Pt reports L thigh and hip pain since last night.  Denies injury at this time.  Pt reports pain is worse with weight bearing and sitting down.  Pt states when she stands up her leg feel like it's "twisted in."

## 2014-10-19 LAB — POC URINE PREG, ED: Preg Test, Ur: NEGATIVE

## 2014-10-19 MED ORDER — OXYCODONE-ACETAMINOPHEN 5-325 MG PO TABS
2.0000 | ORAL_TABLET | Freq: Once | ORAL | Status: AC
  Administered 2014-10-19: 2 via ORAL
  Filled 2014-10-19: qty 2

## 2014-10-19 MED ORDER — KETOROLAC TROMETHAMINE 30 MG/ML IJ SOLN
30.0000 mg | Freq: Once | INTRAMUSCULAR | Status: AC
  Administered 2014-10-19: 30 mg via INTRAMUSCULAR
  Filled 2014-10-19: qty 1

## 2014-10-20 ENCOUNTER — Emergency Department (HOSPITAL_COMMUNITY): Payer: 59

## 2014-10-20 ENCOUNTER — Emergency Department (HOSPITAL_COMMUNITY)
Admission: EM | Admit: 2014-10-20 | Discharge: 2014-10-20 | Disposition: A | Discharge status: Home / Self Care | Payer: 59 | Attending: Emergency Medicine | Admitting: Emergency Medicine

## 2014-10-20 ENCOUNTER — Encounter (HOSPITAL_COMMUNITY): Payer: Self-pay | Admitting: Emergency Medicine

## 2014-10-20 DIAGNOSIS — M1990 Unspecified osteoarthritis, unspecified site: Secondary | ICD-10-CM | POA: Insufficient documentation

## 2014-10-20 DIAGNOSIS — B349 Viral infection, unspecified: Secondary | ICD-10-CM | POA: Diagnosis not present

## 2014-10-20 DIAGNOSIS — Z79899 Other long term (current) drug therapy: Secondary | ICD-10-CM | POA: Diagnosis not present

## 2014-10-20 DIAGNOSIS — M25552 Pain in left hip: Secondary | ICD-10-CM

## 2014-10-20 DIAGNOSIS — M79605 Pain in left leg: Secondary | ICD-10-CM

## 2014-10-20 DIAGNOSIS — R109 Unspecified abdominal pain: Secondary | ICD-10-CM

## 2014-10-20 LAB — I-STAT CG4 LACTIC ACID, ED: LACTIC ACID, VENOUS: 0.69 mmol/L (ref 0.5–2.2)

## 2014-10-20 LAB — URINALYSIS, ROUTINE W REFLEX MICROSCOPIC
BILIRUBIN URINE: NEGATIVE
Glucose, UA: NEGATIVE mg/dL
Hgb urine dipstick: NEGATIVE
Ketones, ur: NEGATIVE mg/dL
Leukocytes, UA: NEGATIVE
Nitrite: NEGATIVE
PH: 6 (ref 5.0–8.0)
Protein, ur: NEGATIVE mg/dL
Specific Gravity, Urine: 1.022 (ref 1.005–1.030)
Urobilinogen, UA: 1 mg/dL (ref 0.0–1.0)

## 2014-10-20 LAB — COMPREHENSIVE METABOLIC PANEL
ALBUMIN: 3.7 g/dL (ref 3.5–5.2)
ALT: 18 U/L (ref 0–35)
AST: 15 U/L (ref 0–37)
Alkaline Phosphatase: 101 U/L (ref 39–117)
Anion gap: 11 (ref 5–15)
BUN: 11 mg/dL (ref 6–23)
CALCIUM: 9.4 mg/dL (ref 8.4–10.5)
CHLORIDE: 105 meq/L (ref 96–112)
CO2: 23 mmol/L (ref 19–32)
CREATININE: 0.75 mg/dL (ref 0.50–1.10)
GFR calc Af Amer: >90 mL/min (ref >90)
GLUCOSE: 89 mg/dL (ref 70–99)
POTASSIUM: 4 mmol/L (ref 3.5–5.1)
Sodium: 139 mmol/L (ref 135–145)
TOTAL PROTEIN: 7.4 g/dL (ref 6.0–8.3)
Total Bilirubin: 0.4 mg/dL (ref 0.3–1.2)

## 2014-10-20 LAB — CBC WITH DIFFERENTIAL/PLATELET
BASOS PCT: 0 % (ref 0–1)
Basophils Absolute: 0 10*3/uL (ref 0.0–0.1)
Eosinophils Absolute: 0.1 10*3/uL (ref 0.0–0.7)
Eosinophils Relative: 1 % (ref 0–5)
HEMATOCRIT: 35.2 % — AB (ref 36.0–46.0)
Hemoglobin: 12.1 g/dL (ref 12.0–15.0)
LYMPHS ABS: 1.8 10*3/uL (ref 0.7–4.0)
Lymphocytes Relative: 25 % (ref 12–46)
MCH: 27.8 pg (ref 26.0–34.0)
MCHC: 34.4 g/dL (ref 30.0–36.0)
MCV: 80.7 fL (ref 78.0–100.0)
MONO ABS: 0.3 10*3/uL (ref 0.1–1.0)
MONOS PCT: 4 % (ref 3–12)
Neutro Abs: 5.2 10*3/uL (ref 1.7–7.7)
Neutrophils Relative %: 70 % (ref 43–77)
PLATELETS: 373 10*3/uL (ref 150–400)
RBC: 4.36 MIL/uL (ref 3.87–5.11)
RDW: 15.1 % (ref 11.5–15.5)
WBC: 7.4 10*3/uL (ref 4.0–10.5)

## 2014-10-20 LAB — LIPASE, BLOOD: Lipase: 22 U/L (ref 11–59)

## 2014-10-20 LAB — RAPID STREP SCREEN (MED CTR MEBANE ONLY): Streptococcus, Group A Screen (Direct): NEGATIVE

## 2014-10-20 MED ORDER — TRAMADOL HCL 50 MG PO TABS
50.0000 mg | ORAL_TABLET | Freq: Once | ORAL | Status: AC
  Administered 2014-10-20: 50 mg via ORAL
  Filled 2014-10-20: qty 1

## 2014-10-20 MED ORDER — ACETAMINOPHEN 325 MG PO TABS
650.0000 mg | ORAL_TABLET | Freq: Once | ORAL | Status: AC
  Administered 2014-10-20: 650 mg via ORAL
  Filled 2014-10-20: qty 2

## 2014-10-20 MED ORDER — ACETAMINOPHEN 325 MG PO TABS
650.0000 mg | ORAL_TABLET | Freq: Once | ORAL | Status: DC
  Filled 2014-10-20: qty 2

## 2014-10-20 MED ORDER — TRAMADOL HCL 50 MG PO TABS: 50.0000 mg | ORAL_TABLET | Freq: Two times a day (BID) | ORAL | Status: DC | PRN

## 2014-10-20 MED ORDER — SODIUM CHLORIDE 0.9 % IV BOLUS (SEPSIS)
500.0000 mL | Freq: Once | INTRAVENOUS | Status: AC
  Administered 2014-10-20: 500 mL via INTRAVENOUS

## 2014-10-20 NOTE — Progress Notes (Signed)
*  PRELIMINARY RESULTS* Vascular Ultrasound Left lower extremity venous duplex has been completed.  Preliminary findings: no evidence of DVT in visualized veins.  Farrel DemarkJill Eunice, RDMS, RVT  10/20/2014, 5:27 PM

## 2014-10-20 NOTE — ED Provider Notes (Signed)
CSN: [id number]     Arrival date & time 10/20/14  1331 History  This chart was scribed for non-physician practitioner, Fayrene HelperBowie Lyana Asbill, PA-C, working with Vida RollerBrian D Miller, MD by Charline BillsEssence Howell, ED Scribe. This patient was seen in room TR05C/TR05C and the patient's care was started at 4:00 PM.   Chief Complaint  Patient presents with  . Hip Pain   The history is provided by the patient. No language interpreter was used.   HPI Comments: Barbara Moore is a 24 y.o. female who presents to the Emergency Department complaining of constant L hip pain that radiates down L leg onset 3 days ago. Pt denies injury. [facility]. XRs were obtained that were normal; pt was discharged with Percocet and Flexeril without relief. She denies SOB, fever. Pt denies recent travel or surgeries. No oral BC. No prior hx of PE/DVT, hemoptysis, calf pain or leg swelling.  Pt does not know why her pain persisted.  No numbness.  Does admit to having a job which requires prolong sitting.  No worsening back pain aside from her usual back pain.  No rash.  Past Medical History  Diagnosis Date  . Seasonal allergies    History reviewed. No pertinent past surgical history. History reviewed. No pertinent family history. History  Substance Use Topics  . Smoking status: Never Smoker   . Smokeless tobacco: Not on file  . Alcohol Use: Yes     Comment: social    OB History    No data available     Review of Systems  Constitutional: Negative for fever.  Respiratory: Negative for shortness of breath.   Musculoskeletal: Positive for arthralgias. Negative for joint swelling.   Allergies  Lactose intolerance (gi)  Home Medications   Prior to Admission medications   Medication Sig Start Date End Date Taking? Authorizing Provider  acetaminophen (TYLENOL) 500 MG tablet Take 1,000 mg by mouth every 6 (six) hours as needed for moderate pain.    Historical Provider, MD  FLUoxetine (PROZAC) 20 MG  capsule Take 20 mg by mouth daily.    Historical Provider, MD  ibuprofen (ADVIL,MOTRIN) 200 MG tablet Take 600 mg by mouth every 6 (six) hours as needed for moderate pain (pain).     Historical Provider, MD  Multiple Vitamin (MULTIVITAMIN WITH MINERALS) TABS tablet Take 1 tablet by mouth daily.    Historical Provider, MD  ondansetron (ZOFRAN) 4 MG tablet Take 1 tablet (4 mg total) by mouth every 8 (eight) hours as needed for nausea or vomiting. Patient not taking: Reported on 10/18/2014 10/24/13   Ward GivensIva L Knapp, MD   Triage Vitals: BP 131/69 mmHg  Pulse 115  Temp(Src) 99 F (37.2 C) (Oral)  Resp 18  Ht 5\' 6"  (1.676 m)  Wt 300 lb (136.079 kg)  BMI 48.44 kg/m2  SpO2 100%  LMP 07/18/2014 Physical Exam  Constitutional: She is oriented to person, place, and time. She appears well-developed and well-nourished. No distress.  HENT:  Head: Normocephalic and atraumatic.  Eyes: Conjunctivae and EOM are normal.  Neck: Neck supple.  Cardiovascular: Normal rate.   Pulmonary/Chest: Effort normal.  Musculoskeletal: Normal range of motion.  Intact patellar reflexes. No foot drop. L knee pain with ambulation. Tenderness to palpation down whole L leg. Full ROM on L knee and L ankle. L leg is not warm to touch. No redness noted.  Neurological: She is alert and oriented to person, place, and time.  Skin: Skin  is warm and dry.  Psychiatric: She has a normal mood and affect. Her behavior is normal.  Nursing note and vitals reviewed.  ED Course  Procedures (including critical care time) DIAGNOSTIC STUDIES: Oxygen Saturation is 100% on RA, normal by my interpretation.    COORDINATION OF CARE: 4:04 PM-pt with persistent L leg/thigh pain without specific injury.  She is NVI.  She is morbidly obese, thus difficult to fully evaluate.  She does not have any risk factors for DVT, however with the persistent sxs, not resolved despite pain medication and a prior negative xray i discussed performing DVT study and pt  agrees.  Discussed treatment plan with pt at bedside and pt agreed to plan.   4:31 PM Care discussed with oncoming provider who will f/u on DVT study.  Pt is tachycardic, however no signs of infection and no c/o cp or sob.  Will monitor.   Labs Review Labs Reviewed - No data to display  Imaging Review Dg Lumbar Spine Complete  10/19/2014   CLINICAL DATA:  Low back pain. Pain radiating down left hip and buttock. No known injury.  EXAM: Lumbar complete.  4+ views  COMPARISON:  None.  FINDINGS: The alignment is maintained. Vertebral body heights are normal. There is no listhesis. The posterior elements are intact. Disc spaces are preserved. No fracture. Sacroiliac joints are symmetric and normal.  IMPRESSION: Unremarkable radiographs of the lumbar spine. No acute bony abnormality.   Electronically Signed   By: Rubye Oaks M.D.   On: 10/19/2014 01:07     EKG Interpretation None      MDM   Final diagnoses:  None    BP 131/69 mmHg  Pulse 115  Temp(Src) 99 F (37.2 C) (Oral)  Resp 18  Ht  (1.676 m)  Wt 300 lb (136.079 kg)  BMI 48.44 kg/m2  SpO2 100%  LMP 07/18/2014   I personally performed the services described in this documentation, which was scribed in my presence. The recorded information has been reviewed and is accurate.    Fayrene Helper, PA-C 10/20/14 1610  Vida Roller, MD 10/21/14 [phone]

## 2014-10-20 NOTE — Discharge Instructions (Signed)
Please call your doctor for a followup appointment within 24-48 hours. When you talk to your doctor please let them know that you were seen in the emergency department and have them acquire all of your records so that they can discuss the findings with you and formulate a treatment plan to fully care for your new and ongoing problems. Please follow-up with orthopedics Please take medications as prescribed and on a full stomach Please discontinue Percocet dispose of them in a proper manner This take medications as prescribed, tramadol-while taking medications there is to be no drinking alcohol, driving, operating any heavy machinery for this is a controlled substance. Please massage as a hot ointment apply warm compressions Please continue to monitor symptoms closely and if symptoms are to worsen or change (fever greater than 101, chills, sweating, nausea, vomiting, chest pain, shortness of breathe, difficulty breathing, weakness, numbness, tingling, worsening or changes to pain pattern, fall, injury, loss of sensation, weakness, redness to the joint, red streaks, skin changes, hot to touch) please report back to the Emergency Department immediately.   Arthritis, Nonspecific Arthritis is inflammation of a joint. This usually means pain, redness, warmth or swelling are present. One or more joints may be involved. There are a number of types of arthritis. Your caregiver may not be able to tell what type of arthritis you have right away. CAUSES  The most common cause of arthritis is the wear and tear on the joint (osteoarthritis). This causes damage to the cartilage, which can break down over time. The knees, hips, back and neck are most often affected by this type of arthritis. Other types of arthritis and common causes of joint pain include:  Sprains and other injuries near the joint. Sometimes minor sprains and injuries cause pain and swelling that develop hours later.  Rheumatoid arthritis. This  affects hands, feet and knees. It usually affects both sides of your body at the same time. It is often associated with chronic ailments, fever, weight loss and general weakness.  Crystal arthritis. Gout and pseudo gout can cause occasional acute severe pain, redness and swelling in the foot, ankle, or knee.  Infectious arthritis. Bacteria can get into a joint through a break in overlying skin. This can cause infection of the joint. Bacteria and viruses can also spread through the blood and affect your joints.  Drug, infectious and allergy reactions. Sometimes joints can become mildly painful and slightly swollen with these types of illnesses. SYMPTOMS   Pain is the main symptom.  Your joint or joints can also be red, swollen and warm or hot to the touch.  You may have a fever with certain types of arthritis, or even feel overall ill.  The joint with arthritis will hurt with movement. Stiffness is present with some types of arthritis. DIAGNOSIS  Your caregiver will suspect arthritis based on your description of your symptoms and on your exam. Testing may be needed to find the type of arthritis:  Blood and sometimes urine tests.  X-ray tests and sometimes CT or MRI scans.  Removal of fluid from the joint (arthrocentesis) is done to check for bacteria, crystals or other causes. Your caregiver (or a specialist) will numb the area over the joint with a local anesthetic, and use a needle to remove joint fluid for examination. This procedure is only minimally uncomfortable.  Even with these tests, your caregiver may not be able to tell what kind of arthritis you have. Consultation with a specialist (rheumatologist) may be helpful. TREATMENT  Your caregiver will discuss with you treatment specific to your type of arthritis. If the specific type cannot be determined, then the following general recommendations may apply. Treatment of severe joint pain  includes:  Rest.  Elevation.  Anti-inflammatory medication (for example, ibuprofen) may be prescribed. Avoiding activities that cause increased pain.  Only take over-the-counter or prescription medicines for pain and discomfort as recommended by your caregiver.  Cold packs over an inflamed joint may be used for 10 to 15 minutes every hour. Hot packs sometimes feel better, but do not use overnight. Do not use hot packs if you are diabetic without your caregiver's permission.  A cortisone shot into arthritic joints may help reduce pain and swelling.  Any acute arthritis that gets worse over the next 1 to 2 days needs to be looked at to be sure there is no joint infection. Long-term arthritis treatment involves modifying activities and lifestyle to reduce joint stress jarring. This can include weight loss. Also, exercise is needed to nourish the joint cartilage and remove waste. This helps keep the muscles around the joint strong. HOME CARE INSTRUCTIONS   Do not take aspirin to relieve pain if gout is suspected. This elevates uric acid levels.  Only take over-the-counter or prescription medicines for pain, discomfort or fever as directed by your caregiver.  Rest the joint as much as possible.  If your joint is swollen, keep it elevated.  Use crutches if the painful joint is in your leg.  Drinking plenty of fluids may help for certain types of arthritis.  Follow your caregiver's dietary instructions.  Try low-impact exercise such as:  Swimming.  Water aerobics.  Biking.  Walking.  Morning stiffness is often relieved by a warm shower.  Put your joints through regular range-of-motion. SEEK MEDICAL CARE IF:   You do not feel better in 24 hours or are getting worse.  You have side effects to medications, or are not getting better with treatment. SEEK IMMEDIATE MEDICAL CARE IF:   You have a fever.  You develop severe joint pain, swelling or redness.  Many joints are  involved and become painful and swollen.  There is severe back pain and/or leg weakness.  You have loss of bowel or bladder control. Document Released: 10/31/2004 Document Revised: 12/16/2011 Document Reviewed: 11/16/2008 [facility] Patient Information 2015 [place], [place]. This information is not intended to replace advice given to you by your health care provider. Make sure you discuss any questions you have with your health care provider.   Emergency Department Resource Guide 1) Find a Doctor and Pay Out of Pocket Although you won't have to find out who is covered by your insurance plan, it is a good idea to ask around and get recommendations. You will then need to call the office and see if the doctor you have chosen will accept you as a new patient and what types of options they offer for patients who are self-pay. Some doctors offer discounts or will set up payment plans for their patients who do not have insurance, but you will need to ask so you aren't surprised when you get to your appointment.  2) Contact Your Local Health Department Not all health departments have doctors that can see patients for sick visits, but many do, so it is worth a call to see if yours does. If you don't know where your local health department is, you can check in your phone book. The CDC also has a tool to help you locate your  state's health department, and many state websites also have listings of all of their local health departments.  3) Find a Walk-in Clinic If your illness is not likely to be very severe or complicated, you may want to try a walk in clinic. These are popping up all over the country in pharmacies, drugstores, and shopping centers. They're usually staffed by nurse practitioners or physician assistants that have been trained to treat common illnesses and complaints. They're usually fairly quick and inexpensive. However, if you have serious medical issues or chronic medical problems, these are  probably not your best option.  No Primary Care Doctor: - Call Health Connect at  [phone] - they can help you locate a primary care doctor that  accepts your insurance, provides certain services, etc. - Physician Referral Service- [phone]  Chronic Pain Problems: Organization         Address  Phone   Notes  [facility]  [phone] Patients need to be referred by their primary care doctor.   Medication Assistance: Organization         Address  Phone   Notes  [facility] [place] Medication [facility] [address] [place], [place] [address] [phone] --Must be a resident of [facility] [place] -- Must have NO insurance coverage whatsoever (no Medicaid/ Medicare, etc.) -- The pt. MUST have a primary care doctor that directs their care regularly and follows them in the community   MedAssist  [phone]   [employer]  [phone]) [phone]    Agencies that provide inexpensive medical care: Organization         Address  Phone   Notes  [facility]  [phone]   [facility]    [phone]   [facility] [address] [place], [place] [address] [phone]   [facility] [address], [place] [phone]   Planned Parenthood    [phone]   [facility]    [phone]   [facility]  [address], [place] Phone:  [phone], Fax:  [phone]) [phone] Hours of Operation:  9 am - 6 pm, M-F.  Also accepts Medicaid/Medicare and self-pay.  [facility]  [address], [place] Phone: [phone], Fax: [phone]. Hours of Operation:  8:30 am - 5:30 pm, M-F.  Also accepts Medicaid and self-pay.  [facility] [place] [address], [place] Phone: [phone]) [phone]   [facility] [address], [place], [place] [phone], Ext. [phone] Mondays & Thursdays:  7-9 AM.  First 15 patients are seen on a first come, first serve basis.    Medicaid-accepting [facility] Providers:  Organization         Address  Phone   Notes  [facility] [address], [place] [phone] Also accepts self-pay patients.  [facility] [address], [place]  [phone]) [phone]   [facility] [address], [place] [phone]   [facility] [address] [place] [address], [place] [phone]   Renaye RakersVeita Bland [address], [place]   [phone] Only accepts [place] Access [place] patients after they have their name applied to their card.   Self-Pay (no insurance) in [facility]:  Organization  Address  Phone   Notes  Sickle Cell Patients, [facility] Yoe [phone]   [facility] [place] [phone]   [facility] [address]  [place], [address], [place] [phone]   [facility]/Dr. Osei-Bonsu  [address], [place] or [address], [place] [phone] Phone number for both [place] and [place] locations is the same.  [facility] [address], [place] [phone]   [facility] [address], [place] or [address] [phone]   [facility] [address] [place] [phone], phone; [phone], fax Sees patients 1st and 3rd Saturday of every month.  Must not qualify for public or private insurance (i.e. Medicaid, Medicare, [place] Health Choice, Veterans' Benefits)  Household income should be no more than 200% of the poverty level The clinic cannot treat you if you are pregnant or think you are pregnant  Sexually transmitted diseases are not treated at the clinic.     Dental Care: Organization         Address  Phone  Notes  [facility] Department of [facility] [phone] Accepts children up to age 1 who are enrolled in [place] or [place]; pregnant women with a Medicaid card; and children who have applied for Medicaid or [place] Health Choice, but were declined, whose parents can pay a reduced fee at time of service.  [facility] Department of [facility]  [address], [place] [phone] Accepts children up to age 2 who are enrolled in [place] or [place]; pregnant women with a Medicaid card; and children who have applied for Medicaid or [place] Health Choice, but were declined, whose parents can pay a reduced fee at time of service.  [facility] Access PROGRAM  [place] [phone] Patients are seen by appointment only. Walk-ins are not accepted. [place] will see patients 86 years of age and older. Monday - Tuesday (8am-5pm) Most Wednesdays (8:30-5pm) $30 per visit, cash only  [facility] Adult Dental Access PROGRAM  [address], [facility] [phone] Patients are seen by appointment only. Walk-ins are not accepted. [place] will see patients 56 years of age and older. One Wednesday Evening (Monthly: Volunteer Based).  $30 per visit, cash only  [place]  [phone] for adults; Children under age 94, call Graduate Pediatric Dentistry at [phone]. Children aged 26-14, please call [phone] to request a pediatric application.  Dental services are provided in all areas of dental care including fillings, crowns and bridges, complete and partial dentures, implants, gum treatment, root canals, and extractions. Preventive care is also provided. Treatment is provided to both adults and children. Patients are selected via a lottery and there is often a waiting  list.   [facility] [address], [place]  [phone] [email or web address]   [facility] [address] [place], [place] [phone], Ext. [phone] Second and Fourth Thursday of each month, opens at 6:30 AM; Clinic ends at 9 AM.  Patients are seen on a first-come first-served basis, and a limited number are seen during each clinic.   [facility]  [address] [place], [place]  [place], [place] [phone]   Eligibility Requirements You must have lived in [place], [place], or [place] counties for at least the last three months.   You cannot be eligible for state or federal sponsored [employer], including [employer], [place], or [employer].   You generally cannot be eligible for healthcare insurance through your employer.    How to apply: Eligibility screenings are held every Tuesday and Wednesday afternoon from 1:00 pm until 4:00 pm. You do not need an appointment for the interview!  [facility] [address], [place], [place]   [place]  [place] Department  [place]  [phone]    Behavioral Health Resources in the Community: Intensive Outpatient Programs Organization         Address  Phone  Notes  Raemon Elkins. [address], [place], [place] [phone]   [facility] Outpatient [address], [place], [place]   ADS: Alcohol & Drug Svcs [address], [place], [place]   [place] [address],  [place], [place] or [phone]   Substance Abuse Resources Organization         Address  Phone  Notes  Alcohol and Drug Services  [phone]   [place]  4697359287   The [place]   Chinita Pester  [phone]   Residential & Outpatient Substance Abuse Program   [phone]   Psychological Services Organization         Address  Phone  Notes  [facility] [place]  [place]  [phone]   [place] [address] [place] or [phone]    Mobile Crisis Teams Organization         Address  Phone  Notes  [facility], Mobile Crisis Care Unit  [phone]   [facility]  [address] [place], [place]   [place] [address] [place] [phone]    Self-Help/Support Groups Organization         Address  Phone             Notes  [place]. of [place] - variety of support groups  [place] Call for more information  Narcotics Anonymous (NA), [facility] [address], [employer] [place]  2 meetings at this location   Special educational needs teacher         Address  Phone  Notes  ASAP Residential Treatment [place],    [place]  [phone]   [facility]  [address] [place] 177939, [place], [place]   [place]hr [place], [place] [phone] Admissions: 8am-3pm M-F  Incentives Substance [place] [address],    [place], [place] [phone]   [facility] [address] Jadene Pierini [place], [place]   [facility] [address],  [place], [place]   [facility]ight Programs - Intensive Outpatient [address], [place], [place]   [facility] ([place].,  [place], [place] or [phone]   [facility] Services (RTS) [address] [place], [place] Accepts Medicaid  [facility] [place] [address],  [place] 1-8626420987 Substance Abuse/Addiction Treatment   [facility] Resources Organization  Address  Phone  Notes  [facility]  [phone]) [phone]   Angie FavaJulie Brannon, PhD [address], [address] [place], [place]   [phone] or [phone]   [facility] [place]   [address] [place], [place] [phone]) [phone]   [facility] [address], [place], [place] [phone] Insurance/Medicaid/sponsorship through [facility]  [facility] Families [address]                                    [place], [place] [phone] Therapy/tele-psych/case  [facility] [address].   [place], [place] [phone]    Dr. Lolly MustacheArfeen  [phone]   [facility] [place]  [employer] [facility] 1) [address], [place] 2) [address], [place] 3)  [address], [place] [phone]) [phone]  [phone]) [phone]   [facility] Child Abuse Hotline [phone]) [phone] or [phone]) [phone] (After Hours)

## 2014-10-20 NOTE — ED Provider Notes (Signed)
Discussed case with Fayrene Helper, PA-C. Transfer of care to this provider at change in shift.  Barbara Moore is a 24 y/o F with no known significant PMHx presenting to the ED with left hip pain that started abruptly approximately 3-4 days ago. No traumatic factors noted.  Patient was seen in the ED setting on 10/18/2014 for the same with negative plain films and negative urine pregnancy test - discharged with Percocets and flexeril. Patient reported that these medications do not aid in relief.   Plan: Doppler of lower extremity. If negative patient can be discharged.   *PRELIMINARY RESULTS* Vascular Ultrasound Left lower extremity venous duplex has been completed. Preliminary findings: no evidence of DVT in visualized veins.  Farrel Demark, RDMS, RVT 10/20/2014, 5:27 PM  Doppler of left lower extremity negative for acute DVT.  Medications  sodium chloride 0.9 % bolus 500 mL (not administered)  traMADol (ULTRAM) tablet 50 mg (50 mg Oral Given 10/20/14 1708)  acetaminophen (TYLENOL) tablet 650 mg (650 mg Oral Given 10/20/14 1903)    Filed Vitals:   10/20/14 1353 10/20/14 1751 10/20/14 1815  BP: 131/69 125/76   Pulse: 115 107 104  Temp: 99 F (37.2 C)  101.4 F (38.6 C)  TempSrc: Oral    Resp: 18 18   Height:  (1.676 m)    Weight: 300 lb (136.079 kg)    SpO2: 100% 100%    6:04 PM This provider spoke with patient. Discussed images in great detail. Patient reported that she's been having this pain for approximately 3-4 days localized left hip described as an aching sensation. Reported that she did start a new job where she is sitting approximate 10 hours per day. Patient is an obese individual. Mild discomfort upon palpation to the left acetabulum and left buttock. Full range of motion to left hip identified. Negative swelling noted to the lower extremities bilaterally. Negative pitting edema. Pulses palpable and strong. Strength intact. Negative focal neurological deficits. Negative  signs of ischemia. Discussed with patient tachycardia upon arrival to the ED with a heart rate of 115 bpm-has decreased while in ED setting to 107 bpm. Patient denied chest pain, shortness of breath, difficulty breathing, weakness, numbness, tingling, birth control, long travels, leg swelling. Suspicion to be possible beginnings of arthritis in the left hip. Doubt septic joint. Doubt PE. Patient stable, afebrile. Patient not septic appearing. Discharged patient. Discharged patient with small dose of tramadol, patient requesting tramadol secondary for this medication working best-discussed course, precautions, disposal technique. Referred [facility] to [facility] and orthopedics. Discussed with patient to rest and stay hydrated. Discussed with patient to avoid any physical strenuous activity. Discussed with patient to perform oral therapy. Discussed with patient to closely monitor symptoms and if symptoms are to worsen or change to report back to the ED - strict return instructions given.  Patient agreed to plan of care, understood, all questions answered.   6:48 PM upon discharge, vital showed the patient had a fever of 101.14F. This provider reassess the patient. Patient starts explaining that she has been having chills since last night. Reported that she's been having some mild throat/neck discomfort that started yesterday. Reported that she's been having some mild abdominal pain. Stated that she has been expressing some mild nasal congestion. Denied chest pain, shortness of breath, difficulty breathing, cough, ear pain, blurred vision, sudden loss of vision, eye complaints, sore throat, difficulty swallowing, nausea, vomiting, diarrhea, melena, and he is here, dysuria, vaginal discharge or abnormal vaginal bleeding.  Heart rate and rhythm normal. Lungs good auscultation to upper lower lobes bilaterally. Neck supple full range of motion-negative cervical lymphadenopathy meningeal signs identified.  Negative trismus. Uvula midline with symmetrical elevation-negative uvula swelling. Negative findings of erythema, inflammation lesions sores, exudate identified to the posterior oropharynx and tonsils bilaterally. Obese. Bowel sounds normoactive in all 4 quadrants. Abdomen soft upon palpation with tenderness upon palpation to abdomen all over - generalized discomfort. Negative swelling, erythema, inflammation, lesions, sores, warmth upon palpation to the left hip. Mild discomfort upon palpation to the lumbar spine.  CBC negative elevated leukocytosis. Hemoglobin 12.1, hematocrit 35.2. CMP unremarkable. Lipase negative elevation. Lactic acid negative elevation. Rapid strep negative. Urinalysis negative for hemoglobin, nitrites, leukocytes-negative findings of infection. Chest x-ray no acute cardiopulmonary disease identified-no evidence of pneumonia. Doubt pneumonia. Doubt UTI. Doubt pyelonephritis. Doubt streptococcal pharyngitis. Doubt retropharyngeal abscess. Doubt peritonsillar abscess. Doubt Ludwig's angina. Abdomen soft with negative focal tenderness-cannot rule out possible gastritis. Patient has been having nasal congestion. Patient given IV fluids and Tylenol in ED setting with decrease in temperature from 101.38F to 99.65F. Heart rate has decreased from 115 to 104 bpm. Suspicion to be viral in nature. Patient stable, afebrile. Patient not septic appearing. Discharged patient. Referred patient to [facility]. Discussed with patient to rest and stay hydrated. Discussed with patient to avoid any physical or strenuous activity. Discussed with patient to closely monitor symptoms and if symptoms are to worsen or change to report back to the ED - strict return instructions given.  Patient agreed to plan of care, understood, all questions answered.   Raymon MuttonMarissa Herbie Lehrmann, PA-C 10/20/14 2143  Vida RollerBrian D Miller, MD 10/21/14 [phone]

## 2014-10-20 NOTE — ED Notes (Signed)
Pt c/o left hip pain with radiation down leg; [facility] and given meds that are not helping

## 2014-10-22 LAB — CULTURE, GROUP A STREP

## 2015-05-26 ENCOUNTER — Emergency Department (INDEPENDENT_AMBULATORY_CARE_PROVIDER_SITE_OTHER)
Admission: EM | Admit: 2015-05-26 | Discharge: 2015-05-26 | Disposition: A | Discharge status: Home / Self Care | Payer: 59 | Source: Home / Self Care | Attending: Emergency Medicine | Admitting: Emergency Medicine

## 2015-05-26 ENCOUNTER — Encounter (HOSPITAL_COMMUNITY): Payer: Self-pay

## 2015-05-26 DIAGNOSIS — J0140 Acute pansinusitis, unspecified: Secondary | ICD-10-CM

## 2015-05-26 DIAGNOSIS — H6591 Unspecified nonsuppurative otitis media, right ear: Secondary | ICD-10-CM | POA: Diagnosis not present

## 2015-05-26 MED ORDER — CETIRIZINE HCL 10 MG PO TABS: 10.0000 mg | ORAL_TABLET | Freq: Every day | ORAL | Status: DC

## 2015-05-26 MED ORDER — IPRATROPIUM BROMIDE 0.06 % NA SOLN: 2.0000 | Freq: Four times a day (QID) | NASAL | Status: DC

## 2015-05-26 MED ORDER — FLUTICASONE PROPIONATE 50 MCG/ACT NA SUSP: 2.0000 | Freq: Every day | NASAL | Status: DC

## 2015-05-26 MED ORDER — AZITHROMYCIN 250 MG PO TABS: ORAL_TABLET | ORAL | Status: DC

## 2015-05-26 NOTE — Discharge Instructions (Signed)
Your sinuses are inflamed. I do not see any signs of bacterial infection at this time. Take Zyrtec daily for the next 2 weeks. Use Atrovent nasal spray 4 times a day for the next 2 weeks. Use Flonase nasal spray daily for the next 2 weeks. Use nasal saline spray as often as you can. If you develop fevers or are not improving by Tuesday, please take the azithromycin. Follow-up as needed.

## 2015-05-26 NOTE — ED Provider Notes (Signed)
CSN: [id number]     Arrival date & time 05/26/15  1628 History   First MD Initiated Contact with Patient 05/26/15 1725     Chief Complaint  Patient presents with  . Facial Pain   (Consider location/radiation/quality/duration/timing/severity/associated sxs/prior Treatment) HPI She is a 24 year old Moore here for evaluation of sinus pressure. She states her symptoms started about 5 days ago. She reports clear nasal discharge, sinus pressure, postnasal drip, sore throat, and ear pain. No fevers or chills. No nausea or vomiting. She reports an intermittent cough. No shortness of breath. She has been taking Advil cold and sinus and Mucinex with minimal improvement.  Past Medical History  Diagnosis Date  . Seasonal allergies    History reviewed. No pertinent past surgical history. History reviewed. No pertinent family history. Social History  Substance Use Topics  . Smoking status: Never Smoker   . Smokeless tobacco: None  . Alcohol Use: Yes     Comment: social    OB History    No data available     Review of Systems As in history of present illness Allergies  Lactose intolerance (gi)  Home Medications   Prior to Admission medications   Medication Sig Start Date End Date Taking? Authorizing Provider  acetaminophen (TYLENOL) 500 MG tablet Take 1,000 mg by mouth every 6 (six) hours as needed for moderate pain.    Historical Provider, MD  azithromycin (ZITHROMAX Z-PAK) 250 MG tablet Take 2 pills today, then 1 pill daily until gone. 05/26/15   Charm Rings, MD  cetirizine (ZYRTEC) 10 MG tablet Take 1 tablet (10 mg total) by mouth daily. 05/26/15   Charm Rings, MD  FLUoxetine (PROZAC) 20 MG capsule Take 20 mg by mouth daily.    Historical Provider, MD  fluticasone (FLONASE) Barbara MCG/ACT nasal spray Place 2 sprays into both nostrils daily. 05/26/15   Charm Rings, MD  ibuprofen (ADVIL,MOTRIN) 200 MG tablet Take 600 mg by mouth every 6 (six) hours as needed for moderate pain (pain).      Historical Provider, MD  ipratropium (ATROVENT) 0.06 % nasal spray Place 2 sprays into both nostrils 4 (four) times daily. 05/26/15   Charm Rings, MD  Multiple Vitamin (MULTIVITAMIN WITH MINERALS) TABS tablet Take 1 tablet by mouth daily.    Historical Provider, MD  traMADol (ULTRAM) Barbara MG tablet Take 1 tablet (Barbara mg total) by mouth every 12 (twelve) hours as needed. 10/20/14   Marissa Sciacca, PA-C   BP 96/63 mmHg  Pulse 80  Temp(Src) 98.7 F (37.1 C) (Oral)  Resp 18  SpO2 99% Physical Exam  Constitutional: She is oriented to person, place, and time. She appears well-developed and well-nourished. No distress.  HENT:  Mouth/Throat: No oropharyngeal exudate.  Mild cobblestoning present. Left TM is normal. Right TM has a clear effusion. Nasal mucosa is inflamed.  Eyes: Conjunctivae are normal.  Neck: Neck supple.  Cardiovascular: Normal rate, regular rhythm and normal heart sounds.   No murmur heard. Pulmonary/Chest: Effort normal and breath sounds normal. No respiratory distress. She has no wheezes. She has no rales.  Lymphadenopathy:    She has no cervical adenopathy.  Neurological: She is alert and oriented to person, place, and time.    ED Course  Procedures (including critical care time) Labs Review Labs Reviewed - No data to display  Imaging Review No results found.   MDM   1. Acute pansinusitis, recurrence not specified   2. Middle ear effusion, right  No signs of bacterial infection at this time. Treat with Zyrtec, Flonase, Atrovent, and nasal saline spray. Prescription for azithromycin given to be filled if no improvement by Tuesday or she develops fevers.    Charm Rings, MD 05/26/15 1754

## 2015-05-26 NOTE — ED Notes (Signed)
C/o facial area pain and pressure since 8-14, increased nasal drainage

## 2015-10-31 ENCOUNTER — Other Ambulatory Visit (HOSPITAL_COMMUNITY)
Admission: RE | Admit: 2015-10-31 | Discharge: 2015-10-31 | Disposition: A | Discharge status: Home / Self Care | Payer: 59 | Source: Ambulatory Visit | Attending: Emergency Medicine | Admitting: Emergency Medicine

## 2015-10-31 ENCOUNTER — Encounter (HOSPITAL_COMMUNITY): Payer: Self-pay | Admitting: Emergency Medicine

## 2015-10-31 ENCOUNTER — Emergency Department (INDEPENDENT_AMBULATORY_CARE_PROVIDER_SITE_OTHER)
Admission: EM | Admit: 2015-10-31 | Discharge: 2015-10-31 | Disposition: A | Discharge status: Home / Self Care | Payer: 59 | Source: Home / Self Care | Attending: Emergency Medicine | Admitting: Emergency Medicine

## 2015-10-31 DIAGNOSIS — H6692 Otitis media, unspecified, left ear: Secondary | ICD-10-CM | POA: Diagnosis present

## 2015-10-31 DIAGNOSIS — I889 Nonspecific lymphadenitis, unspecified: Secondary | ICD-10-CM | POA: Insufficient documentation

## 2015-10-31 DIAGNOSIS — R0982 Postnasal drip: Secondary | ICD-10-CM | POA: Insufficient documentation

## 2015-10-31 DIAGNOSIS — J029 Acute pharyngitis, unspecified: Secondary | ICD-10-CM | POA: Insufficient documentation

## 2015-10-31 LAB — POCT RAPID STREP A: Streptococcus, Group A Screen (Direct): NEGATIVE

## 2015-10-31 MED ORDER — AMOXICILLIN 500 MG PO CAPS: 1000.0000 mg | ORAL_CAPSULE | Freq: Two times a day (BID) | ORAL | Status: DC

## 2015-10-31 NOTE — ED Notes (Signed)
Ear and throat pain, onset Wednesday 10/25/15.  Patient works in a call center, headset particularly painful

## 2015-10-31 NOTE — ED Provider Notes (Signed)
CSN: [id number]     Arrival date & time 10/31/15  1421 History   First MD Initiated Contact with Patient 10/31/15 1541     No chief complaint on file.  (Consider location/radiation/quality/duration/timing/severity/associated sxs/prior Treatment) HPI Comments: 25 year old obese female complaining of some swelling to the left side of the throat and left ear ache. This started approximately 5-6 days ago. She also has PND. Denies fevers. She points to the left side of her neck with there is a small area of tenderness.   Past Medical History  Diagnosis Date  . Seasonal allergies    No past surgical history on file. No family history on file. Social History  Substance Use Topics  . Smoking status: Never Smoker   . Smokeless tobacco: Not on file  . Alcohol Use: Yes     Comment: social    OB History    No data available     Review of Systems  Constitutional: Negative for fever, activity change and fatigue.  HENT: Positive for ear pain, postnasal drip and sore throat. Negative for congestion, ear discharge and sinus pressure.   Respiratory: Negative for cough, chest tightness and shortness of breath.   Cardiovascular: Negative.   Gastrointestinal: Negative.   Musculoskeletal: Negative.   Skin: Negative.   Neurological: Negative.   All other systems reviewed and are negative.   Allergies  Lactose intolerance (gi)  Home Medications   Prior to Admission medications   Medication Sig Start Date End Date Taking? Authorizing Provider  acetaminophen (TYLENOL) 500 MG tablet Take 1,000 mg by mouth every 6 (six) hours as needed for moderate pain.    Historical Provider, MD  amoxicillin (AMOXIL) 500 MG capsule Take 2 capsules (1,000 mg total) by mouth 2 (two) times daily. 10/31/15   Hayden Rasmussen, NP  azithromycin (ZITHROMAX Z-PAK) 250 MG tablet Take 2 pills today, then 1 pill daily until gone. 05/26/15   Charm Rings, MD  cetirizine (ZYRTEC) 10 MG tablet Take 1 tablet (10 mg total) by mouth  daily. 05/26/15   Charm Rings, MD  FLUoxetine (PROZAC) 20 MG capsule Take 20 mg by mouth daily.    Historical Provider, MD  fluticasone (FLONASE) 50 MCG/ACT nasal spray Place 2 sprays into both nostrils daily. 05/26/15   Charm Rings, MD  ibuprofen (ADVIL,MOTRIN) 200 MG tablet Take 600 mg by mouth every 6 (six) hours as needed for moderate pain (pain).     Historical Provider, MD  ipratropium (ATROVENT) 0.06 % nasal spray Place 2 sprays into both nostrils 4 (four) times daily. 05/26/15   Charm Rings, MD  Multiple Vitamin (MULTIVITAMIN WITH MINERALS) TABS tablet Take 1 tablet by mouth daily.    Historical Provider, MD  traMADol (ULTRAM) 50 MG tablet Take 1 tablet (50 mg total) by mouth every 12 (twelve) hours as needed. 10/20/14   Marissa Sciacca, PA-C   Meds Ordered and Administered this Visit  Medications - No data to display  BP 128/74 mmHg  Pulse 87  Temp(Src) 98.3 F (36.8 C) (Oral)  Resp 18  SpO2 100% No data found.   Physical Exam  Constitutional: She appears well-developed and well-nourished. No distress.  HENT:  Right TM is normal. Left TM with erythema and bulging to the superior aspect of the drum. The inferior portion with yellow discoloration. Absence of light reflex. Oropharynx with erythema and clear PND.  Eyes: Conjunctivae and EOM are normal.  Neck: Normal range of motion. Neck supple.  There is a solitary left  anterior cervical lymph node tender to palpation.  Cardiovascular: Normal rate, regular rhythm and normal heart sounds.   Pulmonary/Chest: Effort normal and breath sounds normal.  Neurological: She is alert. She exhibits normal muscle tone.  Skin: Skin is warm and dry.  Psychiatric: She has a normal mood and affect.  Nursing note and vitals reviewed.   ED Course  Procedures (including critical care time)  Labs Review Labs Reviewed  POCT RAPID STREP A   Results for orders placed or performed during the hospital encounter of 10/31/15  POCT rapid strep A  [facility])  Result Value Ref Range   Streptococcus, Group A Screen (Direct) NEGATIVE NEGATIVE     Imaging Review No results found.   Visual Acuity Review  Right Eye Distance:   Left Eye Distance:   Bilateral Distance:    Right Eye Near:   Left Eye Near:    Bilateral Near:         MDM   1. Acute left otitis media, recurrence not specified, unspecified otitis media type   2. Pharyngitis   3. PND (post-nasal drip)   4. Cervical lymphadenitis    Recommend taking antihistamines to limit drainage such as Allegra, Claritin or Zyrtec. Cepacol lozenges to assist with sore throat pain Ibuprofen 600 mg every 6 hours as needed for discomfort or fever. Meds ordered this encounter  Medications  . amoxicillin (AMOXIL) 500 MG capsule    Sig: Take 2 capsules (1,000 mg total) by mouth 2 (two) times daily.    Dispense:  32 capsule    Refill:  0    Order Specific Question:  Supervising Provider    Answer:  Charm Rings [[id number]]       Hayden Rasmussen, NP 10/31/15 1640

## 2015-10-31 NOTE — Discharge Instructions (Signed)
Lymphadenopathy Lymphadenopathy refers to swollen or enlarged lymph glands, also called lymph nodes. Lymph glands are part of your body's defense (immune) system, which protects the body from infections, germs, and diseases. Lymph glands are found in many locations in your body, including the neck, underarm, and groin.  Many things can cause lymph glands to become enlarged. When your immune system responds to germs, such as viruses or bacteria, infection-fighting cells and fluid build up. This causes the glands to grow in size. Usually, this is not something to worry about. The swelling and any soreness often go away without treatment. However, swollen lymph glands can also be caused by a number of diseases. Your health care provider may do various tests to help determine the cause. If the cause of your swollen lymph glands cannot be found, it is important to monitor your condition to make sure the swelling goes away. HOME CARE INSTRUCTIONS Watch your condition for any changes. The following actions may help to lessen any discomfort you are feeling:  Get plenty of rest.  Take medicines only as directed by your health care provider. Your health care provider may recommend over-the-counter medicines for pain.  Apply moist heat compresses to the site of swollen lymph nodes as directed by your health care provider. This can help reduce any pain.  Check your lymph nodes daily for any changes.  Keep all follow-up visits as directed by your health care provider. This is important. SEEK MEDICAL CARE IF:  Your lymph nodes are still swollen after 2 weeks.  Your swelling increases or spreads to other areas.  Your lymph nodes are hard, seem fixed to the skin, or are growing rapidly.  Your skin over the lymph nodes is red and inflamed.  You have a fever.  You have chills.  You have fatigue.  You develop a sore throat.  You have abdominal pain.  You have weight loss.  You have night  sweats. SEEK IMMEDIATE MEDICAL CARE IF:  You notice fluid leaking from the area of the enlarged lymph node.  You have severe pain in any area of your body.  You have chest pain.  You have shortness of breath.   This information is not intended to replace advice given to you by your health care provider. Make sure you discuss any questions you have with your health care provider.   Document Released: 07/02/2008 Document Revised: 10/14/2014 Document Reviewed: 04/28/2014 Elsevier Interactive Patient Education 2016 Else[employer] Inc.  Pharyngitis Recommend taking antihistamines to limit drainage such as Allegra, Claritin or Zyrtec. Cepacol lozenges to assist with sore throat pain Ibuprofen 600 mg every 6 hours as needed for discomfort or fever. Pharyngitis is redness, pain, and swelling (inflammation) of your pharynx.  CAUSES  Pharyngitis is usually caused by infection. Most of the time, these infections are from viruses (viral) and are part of a cold. However, sometimes pharyngitis is caused by bacteria (bacterial). Pharyngitis can also be caused by allergies. Viral pharyngitis may be spread from person to person by coughing, sneezing, and personal items or utensils (cups, forks, spoons, toothbrushes). Bacterial pharyngitis may be spread from person to person by more intimate contact, such as kissing.  SIGNS AND SYMPTOMS  Symptoms of pharyngitis include:   Sore throat.   Tiredness (fatigue).   Low-grade fever.   Headache.  Joint pain and muscle aches.  Skin rashes.  Swollen lymph nodes.  Plaque-like film on throat or tonsils (often seen with bacterial pharyngitis). DIAGNOSIS  Your health care provider will ask  you questions about your illness and your symptoms. Your medical history, along with a physical exam, is often all that is needed to diagnose pharyngitis. Sometimes, a rapid strep test is done. Other lab tests may also be done, depending on the suspected cause.   TREATMENT  Viral pharyngitis will usually get better in 3-4 days without the use of medicine. Bacterial pharyngitis is treated with medicines that kill germs (antibiotics).  HOME CARE INSTRUCTIONS   Drink enough water and fluids to keep your urine clear or pale yellow.   Only take over-the-counter or prescription medicines as directed by your health care provider:   If you are prescribed antibiotics, make sure you finish them even if you start to feel better.   Do not take aspirin.   Get lots of rest.   Gargle with 8 oz of salt water ( tsp of salt per 1 qt of water) as often as every 1-2 hours to soothe your throat.   Throat lozenges (if you are not at risk for choking) or sprays may be used to soothe your throat. SEEK MEDICAL CARE IF:   You have large, tender lumps in your neck.  You have a rash.  You cough up green, yellow-brown, or bloody spit. SEEK IMMEDIATE MEDICAL CARE IF:   Your neck becomes stiff.  You drool or are unable to swallow liquids.  You vomit or are unable to keep medicines or liquids down.  You have severe pain that does not go away with the use of recommended medicines.  You have trouble breathing (not caused by a stuffy nose). MAKE SURE YOU:   Understand these instructions.  Will watch your condition.  Will get help right away if you are not doing well or get worse.   This information is not intended to replace advice given to you by your health care provider. Make sure you discuss any questions you have with your health care provider.   Document Released: 09/23/2005 Document Revised: 07/14/2013 Document Reviewed: 05/31/2013 Elsevier Interactive Patient Education 2016 Elsevier Inc.  Otitis Media, Adult Otitis media is redness, soreness, and inflammation of the middle ear. Otitis media may be caused by allergies or, most commonly, by infection. Often it occurs as a complication of the common cold. SIGNS AND SYMPTOMS Symptoms of otitis  media may include:  Earache.  Fever.  Ringing in your ear.  Headache.  Leakage of fluid from the ear. DIAGNOSIS To diagnose otitis media, your health care provider will examine your ear with an otoscope. This is an instrument that allows your health care provider to see into your ear in order to examine your eardrum. Your health care provider also will ask you questions about your symptoms. TREATMENT  Typically, otitis media resolves on its own within 3-5 days. Your health care provider may prescribe medicine to ease your symptoms of pain. If otitis media does not resolve within 5 days or is recurrent, your health care provider may prescribe antibiotic medicines if he or she suspects that a bacterial infection is the cause. HOME CARE INSTRUCTIONS   If you were prescribed an antibiotic medicine, finish it all even if you start to feel better.  Take medicines only as directed by your health care provider.  Keep all follow-up visits as directed by your health care provider. SEEK MEDICAL CARE IF:  You have otitis media only in one ear, or bleeding from your nose, or both.  You notice a lump on your neck.  You are not getting better  in 3-5 days.  You feel worse instead of better. SEEK IMMEDIATE MEDICAL CARE IF:   You have pain that is not controlled with medicine.  You have swelling, redness, or pain around your ear or stiffness in your neck.  You notice that part of your face is paralyzed.  You notice that the bone behind your ear (mastoid) is tender when you touch it. MAKE SURE YOU:   Understand these instructions.  Will watch your condition.  Will get help right away if you are not doing well or get worse.   This information is not intended to replace advice given to you by your health care provider. Make sure you discuss any questions you have with your health care provider.   Document Released: 06/28/2004 Document Revised: 10/14/2014 Document Reviewed:  04/20/2013 Elsevier Interactive Patient Education 2016 Else[employer] Inc.  Sore Throat A sore throat is a painful, burning, sore, or scratchy feeling of the throat. There may be pain or tenderness when swallowing or talking. You may have other symptoms with a sore throat. These include coughing, sneezing, fever, or a swollen neck. A sore throat is often the first sign of another sickness. These sicknesses may include a cold, flu, strep throat, or an infection called mono. Most sore throats go away without medical treatment.  HOME CARE   Only take medicine as told by your doctor.  Drink enough fluids to keep your pee (urine) clear or pale yellow.  Rest as needed.  Try using throat sprays, lozenges, or suck on hard candy (if older than 4 years or as told).  Sip warm liquids, such as broth, herbal tea, or warm water with honey. Try sucking on frozen ice pops or drinking cold liquids.  Rinse the mouth (gargle) with salt water. Mix 1 teaspoon salt with 8 ounces of water.  Do not smoke. Avoid being around others when they are smoking.  Put a humidifier in your bedroom at night to moisten the air. You can also turn on a hot shower and sit in the bathroom for 5-10 minutes. Be sure the bathroom door is closed. GET HELP RIGHT AWAY IF:   You have trouble breathing.  You cannot swallow fluids, soft foods, or your spit (saliva).  You have more puffiness (swelling) in the throat.  Your sore throat does not get better in 7 days.  You feel sick to your stomach (nauseous) and throw up (vomit).  You have a fever or lasting symptoms for more than 2-3 days.  You have a fever and your symptoms suddenly get worse. MAKE SURE YOU:   Understand these instructions.  Will watch your condition.  Will get help right away if you are not doing well or get worse.   This information is not intended to replace advice given to you by your health care provider. Make sure you discuss any questions you have with  your health care provider.   Document Released: 07/02/2008 Document Revised: 06/17/2012 Document Reviewed: 05/31/2012 Elsevier Interactive Patient Education [employer]

## 2015-11-03 LAB — CULTURE, GROUP A STREP (THRC)

## 2016-01-09 ENCOUNTER — Encounter (HOSPITAL_COMMUNITY): Payer: Self-pay | Admitting: Emergency Medicine

## 2016-01-09 ENCOUNTER — Ambulatory Visit (INDEPENDENT_AMBULATORY_CARE_PROVIDER_SITE_OTHER)
Admission: EM | Admit: 2016-01-09 | Discharge: 2016-01-09 | Disposition: A | Discharge status: Home / Self Care | Payer: 59 | Source: Home / Self Care | Attending: Family Medicine | Admitting: Family Medicine

## 2016-01-09 DIAGNOSIS — L509 Urticaria, unspecified: Secondary | ICD-10-CM | POA: Diagnosis not present

## 2016-01-09 MED ORDER — METHYLPREDNISOLONE ACETATE 40 MG/ML IJ SUSP
80.0000 mg | Freq: Once | INTRAMUSCULAR | Status: AC
  Administered 2016-01-09: 80 mg via INTRAMUSCULAR

## 2016-01-09 MED ORDER — TRIAMCINOLONE ACETONIDE 40 MG/ML IJ SUSP
40.0000 mg | Freq: Once | INTRAMUSCULAR | Status: AC
  Administered 2016-01-09: 40 mg via INTRAMUSCULAR

## 2016-01-09 MED ORDER — METHYLPREDNISOLONE ACETATE 80 MG/ML IJ SUSP
INTRAMUSCULAR | Status: AC
  Filled 2016-01-09: qty 1

## 2016-01-09 MED ORDER — HYDROXYZINE HCL 25 MG PO TABS: 25.0000 mg | ORAL_TABLET | Freq: Three times a day (TID) | ORAL | Status: DC

## 2016-01-09 MED ORDER — TRIAMCINOLONE ACETONIDE 40 MG/ML IJ SUSP
INTRAMUSCULAR | Status: AC
  Filled 2016-01-09: qty 1

## 2016-01-09 NOTE — ED Provider Notes (Signed)
CSN: [id number]     Arrival date & time 01/09/16  1335 History   First MD Initiated Contact with Patient 01/09/16 1535     Chief Complaint  Patient presents with  . Rash   (Consider location/radiation/quality/duration/timing/severity/associated sxs/prior Treatment) Patient is a 25 y.o. female presenting with rash. The history is provided by the patient.  Rash Location:  Full body Quality: itchiness   Severity:  Mild Onset quality:  Gradual Duration:  3 days Progression:  Waxing and waning Chronicity:  Recurrent Context comment:  H/o hives, recurrent seasonal sx. Ineffective treatments:  Antihistamines Associated symptoms: no fever, no shortness of breath and not wheezing     Past Medical History  Diagnosis Date  . Seasonal allergies    History reviewed. No pertinent past surgical history. No family history on file. Social History  Substance Use Topics  . Smoking status: Never Smoker   . Smokeless tobacco: None  . Alcohol Use: Yes     Comment: social    OB History    No data available     Review of Systems  Constitutional: Negative.  Negative for fever.  HENT: Negative.   Respiratory: Negative.  Negative for shortness of breath and wheezing.   Skin: Positive for rash.  All other systems reviewed and are negative.   Allergies  Lactose intolerance (gi)  Home Medications   Prior to Admission medications   Medication Sig Start Date End Date Taking? Authorizing Provider  acetaminophen (TYLENOL) 500 MG tablet Take 1,000 mg by mouth every 6 (six) hours as needed for moderate pain.    Historical Provider, MD  amoxicillin (AMOXIL) 500 MG capsule Take 2 capsules (1,000 mg total) by mouth 2 (two) times daily. 10/31/15   Hayden Rasmussen, NP  azithromycin (ZITHROMAX Z-PAK) 250 MG tablet Take 2 pills today, then 1 pill daily until gone. 05/26/15   Charm Rings, MD  cetirizine (ZYRTEC) 10 MG tablet Take 1 tablet (10 mg total) by mouth daily. 05/26/15   Charm Rings, MD  FLUoxetine  (PROZAC) 20 MG capsule Take 20 mg by mouth daily.    Historical Provider, MD  fluticasone (FLONASE) 50 MCG/ACT nasal spray Place 2 sprays into both nostrils daily. 05/26/15   Charm Rings, MD  hydrOXYzine (ATARAX/VISTARIL) 25 MG tablet Take 1 tablet (25 mg total) by mouth 3 (three) times daily. Prn hives 01/09/16   Linna Hoff, MD  ibuprofen (ADVIL,MOTRIN) 200 MG tablet Take 600 mg by mouth every 6 (six) hours as needed for moderate pain (pain).     Historical Provider, MD  ipratropium (ATROVENT) 0.06 % nasal spray Place 2 sprays into both nostrils 4 (four) times daily. 05/26/15   Charm Rings, MD  Multiple Vitamin (MULTIVITAMIN WITH MINERALS) TABS tablet Take 1 tablet by mouth daily.    Historical Provider, MD  traMADol (ULTRAM) 50 MG tablet Take 1 tablet (50 mg total) by mouth every 12 (twelve) hours as needed. 10/20/14   Marissa Sciacca, PA-C   Meds Ordered and Administered this Visit   Medications  triamcinolone acetonide (KENALOG-40) injection 40 mg (40 mg Intramuscular Given 01/09/16 1600)  methylPREDNISolone acetate (DEPO-MEDROL) injection 80 mg (80 mg Intramuscular Given 01/09/16 1600)    BP 123/78 mmHg  Pulse 69  Temp(Src) 98.1 F (36.7 C) (Oral)  Resp 16  SpO2 95% No data found.   Physical Exam  Constitutional: She appears well-developed and well-nourished.  HENT:  Mouth/Throat: Oropharynx is clear and moist.  Neck: Normal range of motion. Neck  supple.  Pulmonary/Chest: Effort normal and breath sounds normal.  Lymphadenopathy:    She has no cervical adenopathy.  Skin: Skin is warm and dry. Rash noted.  Scattered hives, gen pruritis.  Nursing note and vitals reviewed.   ED Course  Procedures (including critical care time)  Labs Review Labs Reviewed - No data to display  Imaging Review No results found.   Visual Acuity Review  Right Eye Distance:   Left Eye Distance:   Bilateral Distance:    Right Eye Near:   Left Eye Near:    Bilateral Near:         MDM    1. Urticaria of unknown origin        Linna HoffJames D Josephus Harriger, MD 01/09/16 [phone]

## 2016-01-26 ENCOUNTER — Encounter (HOSPITAL_COMMUNITY): Payer: Self-pay | Admitting: Emergency Medicine

## 2016-01-26 ENCOUNTER — Ambulatory Visit (HOSPITAL_COMMUNITY)
Admission: EM | Admit: 2016-01-26 | Discharge: 2016-01-26 | Disposition: A | Discharge status: Home / Self Care | Payer: 59 | Attending: Emergency Medicine | Admitting: Emergency Medicine

## 2016-01-26 DIAGNOSIS — Z79899 Other long term (current) drug therapy: Secondary | ICD-10-CM | POA: Diagnosis not present

## 2016-01-26 DIAGNOSIS — R109 Unspecified abdominal pain: Secondary | ICD-10-CM | POA: Diagnosis not present

## 2016-01-26 DIAGNOSIS — J302 Other seasonal allergic rhinitis: Secondary | ICD-10-CM | POA: Insufficient documentation

## 2016-01-26 DIAGNOSIS — N939 Abnormal uterine and vaginal bleeding, unspecified: Secondary | ICD-10-CM

## 2016-01-26 DIAGNOSIS — N923 Ovulation bleeding: Secondary | ICD-10-CM

## 2016-01-26 DIAGNOSIS — Z888 Allergy status to other drugs, medicaments and biological substances status: Secondary | ICD-10-CM | POA: Diagnosis not present

## 2016-01-26 LAB — POCT I-STAT, CHEM 8
BUN: 13 mg/dL (ref 6–20)
CALCIUM ION: 1.19 mmol/L (ref 1.12–1.23)
CHLORIDE: 103 mmol/L (ref 101–111)
Creatinine, Ser: 0.7 mg/dL (ref 0.44–1.00)
Glucose, Bld: 104 mg/dL — ABNORMAL HIGH (ref 65–99)
HCT: 40 % (ref 36.0–46.0)
Hemoglobin: 13.6 g/dL (ref 12.0–15.0)
POTASSIUM: 3.4 mmol/L — AB (ref 3.5–5.1)
SODIUM: 142 mmol/L (ref 135–145)
TCO2: 26 mmol/L (ref 0–100)

## 2016-01-26 LAB — POCT URINALYSIS DIP (DEVICE)
GLUCOSE, UA: NEGATIVE mg/dL
KETONES UR: 15 mg/dL — AB
Leukocytes, UA: NEGATIVE
Nitrite: NEGATIVE
PH: 5.5 (ref 5.0–8.0)
PROTEIN: 30 mg/dL — AB
Urobilinogen, UA: 0.2 mg/dL (ref 0.0–1.0)

## 2016-01-26 LAB — POCT PREGNANCY, URINE: PREG TEST UR: NEGATIVE

## 2016-01-26 MED ORDER — MEDROXYPROGESTERONE ACETATE 10 MG PO TABS: 10.0000 mg | ORAL_TABLET | Freq: Every day | ORAL | Status: DC

## 2016-01-26 NOTE — ED Notes (Signed)
Patient reports starting period on 4/8.  Reports bleeding since onset 4/8.  Patient reports generalized abdominal pain, but greater in right.  Patient reports she is passing clots.

## 2016-01-26 NOTE — ED Provider Notes (Signed)
CSN: [id number]     Arrival date & time 01/26/16  1859 History   First MD Initiated Contact with Patient 01/26/16 2019     Chief Complaint  Patient presents with  . Vaginal Bleeding   (Consider location/radiation/quality/duration/timing/severity/associated sxs/prior Treatment) HPI Comments: Patient presents with continued vaginal bleeding x 3 weeks. No prior history of this. Some clots are noted. Using 1-2 super night pads every 3-4 hours. Mild abdominal cramping and some fatigue. No nausea or vomiting. No abdominal frank pain. No urinary symptoms. Would like STD check as well. No ORC at this time.   Patient is a 25 y.o. female presenting with vaginal bleeding. The history is provided by the patient.  Vaginal Bleeding Associated symptoms: abdominal pain   Associated symptoms: no back pain, no fever, no nausea and no vaginal discharge     Past Medical History  Diagnosis Date  . Seasonal allergies    History reviewed. No pertinent past surgical history. No family history on file. Social History  Substance Use Topics  . Smoking status: Never Smoker   . Smokeless tobacco: None  . Alcohol Use: Yes     Comment: social    OB History    No data available     Review of Systems  Constitutional: Negative for fever.  Gastrointestinal: Positive for abdominal pain. Negative for nausea, diarrhea and abdominal distention.  Genitourinary: Positive for vaginal bleeding. Negative for urgency, hematuria, flank pain, vaginal discharge, vaginal pain and pelvic pain.  Musculoskeletal: Negative for back pain.  Skin: Negative.     Allergies  Lactose intolerance (gi)  Home Medications   Prior to Admission medications   Medication Sig Start Date End Date Taking? Authorizing Provider  acetaminophen (TYLENOL) 500 MG tablet Take 1,000 mg by mouth every 6 (six) hours as needed for moderate pain.    Historical Provider, MD  amoxicillin (AMOXIL) 500 MG capsule Take 2 capsules (1,000 mg total) by  mouth 2 (two) times daily. Patient not taking: Reported on 01/26/2016 10/31/15   Hayden Rasmussenavid Mabe, NP  azithromycin (ZITHROMAX Z-PAK) 250 MG tablet Take 2 pills today, then 1 pill daily until gone. 05/26/15   Charm RingsErin J Honig, MD  cetirizine (ZYRTEC) 10 MG tablet Take 1 tablet (10 mg total) by mouth daily. 05/26/15   Charm RingsErin J Honig, MD  FLUoxetine (PROZAC) 20 MG capsule Take 20 mg by mouth daily.    Historical Provider, MD  fluticasone (FLONASE) 50 MCG/ACT nasal spray Place 2 sprays into both nostrils daily. 05/26/15   Charm RingsErin J Honig, MD  hydrOXYzine (ATARAX/VISTARIL) 25 MG tablet Take 1 tablet (25 mg total) by mouth 3 (three) times daily. Prn hives 01/09/16   Linna HoffJames D Kindl, MD  ibuprofen (ADVIL,MOTRIN) 200 MG tablet Take 600 mg by mouth every 6 (six) hours as needed for moderate pain (pain).     Historical Provider, MD  ipratropium (ATROVENT) 0.06 % nasal spray Place 2 sprays into both nostrils 4 (four) times daily. 05/26/15   Charm RingsErin J Honig, MD  medroxyPROGESTERone (PROVERA) 10 MG tablet Take 1 tablet (10 mg total) by mouth daily. 01/26/16   Riki SheerMichelle G Zari Cly, PA-C  Multiple Vitamin (MULTIVITAMIN WITH MINERALS) TABS tablet Take 1 tablet by mouth daily.    Historical Provider, MD  traMADol (ULTRAM) 50 MG tablet Take 1 tablet (50 mg total) by mouth every 12 (twelve) hours as needed. 10/20/14   Marissa Sciacca, PA-C   Meds Ordered and Administered this Visit  Medications - No data to display  BP 138/72 mmHg  Pulse 90  Temp(Src) 99.3 F (37.4 C) (Oral)  Resp 16  SpO2 100%  LMP 01/26/2016 No data found.   Physical Exam  Constitutional: She is oriented to person, place, and time. She appears well-developed and well-nourished. No distress.  HENT:  Head: Normocephalic and atraumatic.  Abdominal: Soft. Bowel sounds are normal. She exhibits no distension and no mass. There is no tenderness. There is no rebound and no guarding.  Genitourinary: Vagina normal and uterus normal. No vaginal discharge found.  Blood in  vaginal vault without masses or foreign bodies. No lesions  Neurological: She is alert and oriented to person, place, and time.  Skin: Skin is warm and dry. She is not diaphoretic.  Psychiatric: Her behavior is normal.  Nursing note and vitals reviewed.   ED Course  Procedures (including critical care time)  Labs Review Labs Reviewed  POCT URINALYSIS DIP (DEVICE) - Abnormal; Notable for the following:    Bilirubin Urine SMALL (*)    Ketones, ur 15 (*)    Hgb urine dipstick MODERATE (*)    Protein, ur 30 (*)    All other components within normal limits  POCT I-STAT, CHEM 8 - Abnormal; Notable for the following:    Potassium 3.4 (*)    Glucose, Bld 104 (*)    All other components within normal limits  POCT PREGNANCY, URINE  CERVICOVAGINAL ANCILLARY ONLY    Imaging Review No results found.   Visual Acuity Review  Right Eye Distance:   Left Eye Distance:   Bilateral Distance:    Right Eye Near:   Left Eye Near:    Bilateral Near:         MDM   1. Vaginal bleeding between periods    No masses, abnormal exam or anemia. STD check as requested but asymptomatic. Treat with provera x 2 weeks the education. F/U with GYN if this continues to occur for further evaluation     Riki Sheer, PA-C 01/26/16 2102

## 2016-01-26 NOTE — Discharge Instructions (Signed)
Everything looks good on both exam and labs (no signs of anemia). Going to treat you with once a day medication for 2 weeks which should help. You may then have another period following this, but hopefully to resume to regular. If not please f/u with GYN for further evaluation.   Abnormal Uterine Bleeding Abnormal uterine bleeding can affect women at various stages in life, including teenagers, women in their reproductive years, pregnant women, and women who have reached menopause. Several kinds of uterine bleeding are considered abnormal, including:  Bleeding or spotting between periods.   Bleeding after sexual intercourse.   Bleeding that is heavier or more than normal.   Periods that last longer than usual.  Bleeding after menopause.  Many cases of abnormal uterine bleeding are minor and simple to treat, while others are more serious. Any type of abnormal bleeding should be evaluated by your health care provider. Treatment will depend on the cause of the bleeding. HOME CARE INSTRUCTIONS Monitor your condition for any changes. The following actions may help to alleviate any discomfort you are experiencing:  Avoid the use of tampons and douches as directed by your health care provider.  Change your pads frequently. You should get regular pelvic exams and Pap tests. Keep all follow-up appointments for diagnostic tests as directed by your health care provider.  SEEK MEDICAL CARE IF:   Your bleeding lasts more than 1 week.   You feel dizzy at times.  SEEK IMMEDIATE MEDICAL CARE IF:   You pass out.   You are changing pads every 15 to 30 minutes.   You have abdominal pain.  You have a fever.   You become sweaty or weak.   You are passing large blood clots from the vagina.   You start to feel nauseous and vomit. MAKE SURE YOU:   Understand these instructions.  Will watch your condition.  Will get help right away if you are not doing well or get worse.   This  information is not intended to replace advice given to you by your health care provider. Make sure you discuss any questions you have with your health care provider.   Document Released: 09/23/2005 Document Revised: 09/28/2013 Document Reviewed: 04/22/2013 Elsevier Interactive Patient Education [employer]

## 2016-01-29 LAB — CERVICOVAGINAL ANCILLARY ONLY
Chlamydia: NEGATIVE
Neisseria Gonorrhea: NEGATIVE

## 2016-01-30 ENCOUNTER — Telehealth (HOSPITAL_COMMUNITY): Payer: Self-pay | Admitting: Internal Medicine

## 2016-01-30 DIAGNOSIS — B9689 Other specified bacterial agents as the cause of diseases classified elsewhere: Secondary | ICD-10-CM

## 2016-01-30 DIAGNOSIS — N760 Acute vaginitis: Principal | ICD-10-CM

## 2016-01-30 LAB — CERVICOVAGINAL ANCILLARY ONLY: WET PREP (BD AFFIRM): POSITIVE — AB

## 2016-01-30 MED ORDER — METRONIDAZOLE 500 MG PO TABS: 500.0000 mg | ORAL_TABLET | Freq: Two times a day (BID) | ORAL | Status: AC

## 2016-01-30 NOTE — ED Notes (Signed)
Clinical staff, please let patient know that test for gardnerella (bacterial vaginosis) is positive.  Rx metronidazole sent to the pharmacy of record, [facility] on [address].  Recheck for persistent sx's.  LM  Eustace MooreLaura W Gissela Bloch, MD 01/30/16 [phone]

## 2016-01-31 ENCOUNTER — Telehealth (HOSPITAL_COMMUNITY): Payer: Self-pay | Admitting: Emergency Medicine

## 2016-01-31 NOTE — ED Notes (Signed)
Pt called back... notified of recent lab results from visit 4/21 Pt ID'd properly... Reports feeling better and sx have subsided  Per Dr. Dayton ScrapeMurray,  Please let patient know that test for gardnerella (bacterial vaginosis) was positive. Rx metronidazole was sent to the pharmacy of record, [facility] on [address]. Recheck for further evaluation if symptoms persist. LM  Adv pt if sx are not getting better to return  Pt verb understanding Education on safe sex given

## 2016-01-31 NOTE — ED Notes (Signed)
LM on pt's VM [phone] Need to give lab results from recent visit on 4/21 Also let pt know labs can be obtained from MyChart  Per Dr. Dayton ScrapeMurray,  Please let patient know that test for gardnerella (bacterial vaginosis) was positive. Rx metronidazole was sent to the pharmacy of record, [facility] on [address]. Recheck for further evaluation if symptoms persist. LM

## 2016-02-22 ENCOUNTER — Encounter (HOSPITAL_COMMUNITY): Payer: Self-pay | Admitting: Emergency Medicine

## 2016-02-22 ENCOUNTER — Ambulatory Visit (HOSPITAL_COMMUNITY)
Admission: EM | Admit: 2016-02-22 | Discharge: 2016-02-22 | Disposition: A | Discharge status: Home / Self Care | Payer: 59 | Attending: Emergency Medicine | Admitting: Emergency Medicine

## 2016-02-22 DIAGNOSIS — N898 Other specified noninflammatory disorders of vagina: Secondary | ICD-10-CM | POA: Diagnosis present

## 2016-02-22 DIAGNOSIS — N760 Acute vaginitis: Secondary | ICD-10-CM

## 2016-02-22 MED ORDER — BENZOCAINE-RESORCINOL 5-2 % VA CREA: TOPICAL_CREAM | Freq: Every day | VAGINAL | Status: DC

## 2016-02-22 NOTE — ED Provider Notes (Signed)
CSN: [id number]     Arrival date & time 02/22/16  1832 History   First MD Initiated Contact with Patient 02/22/16 1916     Chief Complaint  Patient presents with  . vaginal irritation    (Consider location/radiation/quality/duration/timing/severity/associated sxs/prior Treatment) HPI History obtained from patient:  Pt presents with the cc of: Vaginal irritation Duration of symptoms: One week Treatment prior to arrival: Washing the area Context: Recent change in sexual partner Other symptoms include: Some discharge Pain score: None FAMILY HISTORY: History of cancer in family SOCIAL HISTORY: Nonsmoker  Past Medical History  Diagnosis Date  . Seasonal allergies    History reviewed. No pertinent past surgical history. History reviewed. No pertinent family history. Social History  Substance Use Topics  . Smoking status: Never Smoker   . Smokeless tobacco: None  . Alcohol Use: Yes     Comment: social    OB History    No data available     Review of Systems  Denies: HEADACHE, NAUSEA, ABDOMINAL PAIN, CHEST PAIN, CONGESTION, DYSURIA, SHORTNESS OF BREATH  Allergies  Lactose intolerance (gi)  Home Medications   Prior to Admission medications   Medication Sig Start Date End Date Taking? Authorizing Provider  acetaminophen (TYLENOL) 500 MG tablet Take 1,000 mg by mouth every 6 (six) hours as needed for moderate pain.    Historical Provider, MD  amoxicillin (AMOXIL) 500 MG capsule Take 2 capsules (1,000 mg total) by mouth 2 (two) times daily. Patient not taking: Reported on 01/26/2016 10/31/15   Hayden Rasmussenavid Mabe, NP  azithromycin (ZITHROMAX Z-PAK) 250 MG tablet Take 2 pills today, then 1 pill daily until gone. 05/26/15   Charm RingsErin J Honig, MD  benzocaine-resorcinol (VAGISIL) 5-2 % vaginal cream Place vaginally at bedtime. 02/22/16   Tharon AquasFrank C Tehya Leath, PA  cetirizine (ZYRTEC) 10 MG tablet Take 1 tablet (10 mg total) by mouth daily. 05/26/15   Charm RingsErin J Honig, MD  FLUoxetine (PROZAC) 20 MG capsule  Take 20 mg by mouth daily.    Historical Provider, MD  fluticasone (FLONASE) 50 MCG/ACT nasal spray Place 2 sprays into both nostrils daily. 05/26/15   Charm RingsErin J Honig, MD  hydrOXYzine (ATARAX/VISTARIL) 25 MG tablet Take 1 tablet (25 mg total) by mouth 3 (three) times daily. Prn hives 01/09/16   Linna HoffJames D Kindl, MD  ibuprofen (ADVIL,MOTRIN) 200 MG tablet Take 600 mg by mouth every 6 (six) hours as needed for moderate pain (pain).     Historical Provider, MD  ipratropium (ATROVENT) 0.06 % nasal spray Place 2 sprays into both nostrils 4 (four) times daily. 05/26/15   Charm RingsErin J Honig, MD  medroxyPROGESTERone (PROVERA) 10 MG tablet Take 1 tablet (10 mg total) by mouth daily. 01/26/16   Riki SheerMichelle G Young, PA-C  Multiple Vitamin (MULTIVITAMIN WITH MINERALS) TABS tablet Take 1 tablet by mouth daily.    Historical Provider, MD  traMADol (ULTRAM) 50 MG tablet Take 1 tablet (50 mg total) by mouth every 12 (twelve) hours as needed. 10/20/14   Marissa Sciacca, PA-C   Meds Ordered and Administered this Visit  Medications - No data to display  BP 157/97 mmHg  Pulse 89  Temp(Src) 99.1 F (37.3 C) (Oral)  Resp 18  SpO2 98%  LMP 01/26/2016 No data found.   Physical Exam NURSES NOTES AND VITAL SIGNS REVIEWED. CONSTITUTIONAL: Well developed, well nourished, no acute distress HEENT: normocephalic, atraumatic EYES: Conjunctiva normal NECK:normal ROM, supple, no adenopathy PULMONARY:No respiratory distress, normal effort ABDOMINAL: Soft, ND, NT BS+, No CVAT MUSCULOSKELETAL: Normal ROM  of all extremities,  SKIN: warm and dry without rash PSYCHIATRIC: Mood and affect, behavior are normal NURSES NOTES AND VITAL SIGNS REVIEWED. CONSTITUTIONAL: Well developed, well nourished, no acute distress HEENT: normocephalic, atraumatic, right and left TM's are normal EYES: Conjunctiva normal NECK:normal ROM, supple, no adenopathy PULMONARY:No respiratory distress, normal effort Exam is performed with patient's permission Female  nursing staff present to chaperone.  Perineum: clean, dry without lesions, no groin or inguinal Lymphadenopathy; urethra . No caruncle or prolapse noted.  Vaginal canal: moderate amount thick white adherent discharge noted in canal with loss of rugae. Scant amount thin homogenous white-yellow discharge in fornix.  Cervix is pink and friable.  Small amount of discharge from cervix.    ED Course  Procedures (including critical care time)  Labs Review Labs Reviewed  CERVICOVAGINAL ANCILLARY ONLY   Labs are pending Imaging Review No results found.   Visual Acuity Review  Right Eye Distance:   Left Eye Distance:   Bilateral Distance:    Right Eye Near:   Left Eye Near:    Bilateral Near:      Prescription for   Vagisil   MDM   1. Vaginitis     Patient is reassured that there are no issues that require transfer to higher level of care at this time or additional tests. Patient is advised to continue home symptomatic treatment. Patient is advised that if there are new or worsening symptoms to attend the emergency department, contact primary care provider, or return to UC. Instructions of care provided discharged home in stable condition.    THIS NOTE WAS GENERATED USING A VOICE RECOGNITION SOFTWARE PROGRAM. ALL REASONABLE EFFORTS  WERE MADE TO PROOFREAD THIS DOCUMENT FOR ACCURACY.  I have verbally reviewed the discharge instructions with the patient. A printed AVS was given to the patient.  All questions were answered prior to discharge.     Tharon Aquas, PA 02/22/16 1943

## 2016-02-22 NOTE — ED Notes (Signed)
Pt complains of redness and irritation around her vagina.  She has no urinary issues, no d/c, no odor.  The irritation only hurts to touch.  She admits to some aggressive intercourse about one week ago and it has been irritated since.

## 2016-02-22 NOTE — Discharge Instructions (Signed)
Vaginitis Vaginitis is an inflammation of the vagina. It can happen when the normal bacteria and yeast in the vagina grow too much. There are different types. Treatment will depend on the type you have. HOME CARE  Take all medicines as told by your doctor.  Keep your vagina area clean and dry. Avoid soap. Rinse the area with water.  Avoid washing and cleaning out the vagina (douching).  Do not use tampons or have sex (intercourse) until your treatment is done.  Wipe from front to back after going to the restroom.  Wear cotton underwear.  Avoid wearing underwear while you sleep until your vaginitis is gone.  Avoid tight pants. Avoid underwear or nylons without a cotton panel.  Take off wet clothing (such as a bathing suit) as soon as you can.  Use mild, unscented products. Avoid fabric softeners and scented:  Feminine sprays.  Laundry detergents.  Tampons.  Soaps or bubble baths.  Practice safe sex and use condoms. GET HELP RIGHT AWAY IF:   You have belly (abdominal) pain.  You have a fever or lasting symptoms for more than 2-3 days.  You have a fever and your symptoms suddenly get worse. MAKE SURE YOU:   Understand these instructions.  Will watch this condition.  Will get help right away if you are not doing well or get worse.   This information is not intended to replace advice given to you by your health care provider. Make sure you discuss any questions you have with your health care provider.   Document Released: 12/20/2008 Document Revised: 06/17/2012 Document Reviewed: 03/05/2012 Elsevier Interactive Patient Education 2016 Elsevier Inc.  

## 2016-02-23 LAB — CERVICOVAGINAL ANCILLARY ONLY
CHLAMYDIA, DNA PROBE: NEGATIVE
NEISSERIA GONORRHEA: NEGATIVE

## 2016-02-26 LAB — CERVICOVAGINAL ANCILLARY ONLY: WET PREP (BD AFFIRM): NEGATIVE

## 2016-02-27 ENCOUNTER — Telehealth (HOSPITAL_COMMUNITY): Payer: Self-pay | Admitting: Emergency Medicine

## 2016-02-27 NOTE — ED Notes (Signed)
Called pt and notified of recent lab results from visit 5/18 Pt ID'd properly... Reports she is still having vag itching and irritation Adv pt to wait 3-5 days and if sx are not getting any better, to return or f/u w/GYN  Per Dr. Dayton ScrapeMurray,  Please let patient know that tests for gonorrhea/chlamydia were negative. Recheck as needed. LM  Pt verb understanding Education on safe sex given

## 2016-02-29 ENCOUNTER — Encounter (HOSPITAL_COMMUNITY): Payer: Self-pay

## 2016-02-29 ENCOUNTER — Inpatient Hospital Stay (HOSPITAL_COMMUNITY)
Admission: AD | Admit: 2016-02-29 | Discharge: 2016-02-29 | Disposition: A | Discharge status: Home / Self Care | Payer: 59 | Source: Ambulatory Visit | Attending: Obstetrics & Gynecology | Admitting: Obstetrics & Gynecology

## 2016-02-29 DIAGNOSIS — N760 Acute vaginitis: Secondary | ICD-10-CM | POA: Diagnosis not present

## 2016-02-29 DIAGNOSIS — Z3202 Encounter for pregnancy test, result negative: Secondary | ICD-10-CM | POA: Insufficient documentation

## 2016-02-29 DIAGNOSIS — N898 Other specified noninflammatory disorders of vagina: Secondary | ICD-10-CM | POA: Diagnosis present

## 2016-02-29 DIAGNOSIS — B9689 Other specified bacterial agents as the cause of diseases classified elsewhere: Secondary | ICD-10-CM

## 2016-02-29 LAB — URINALYSIS, ROUTINE W REFLEX MICROSCOPIC
GLUCOSE, UA: 250 mg/dL — AB
Ketones, ur: NEGATIVE mg/dL
NITRITE: POSITIVE — AB
PROTEIN: 100 mg/dL — AB
Specific Gravity, Urine: >1.03 — ABNORMAL HIGH (ref 1.005–1.030)
pH: 5 (ref 5.0–8.0)

## 2016-02-29 LAB — WET PREP, GENITAL
Sperm: NONE SEEN
TRICH WET PREP: NONE SEEN
Yeast Wet Prep HPF POC: NONE SEEN

## 2016-02-29 LAB — URINE MICROSCOPIC-ADD ON

## 2016-02-29 LAB — POCT PREGNANCY, URINE: Preg Test, Ur: NEGATIVE

## 2016-02-29 MED ORDER — VALACYCLOVIR HCL 500 MG PO TABS
1000.0000 mg | ORAL_TABLET | Freq: Once | ORAL | Status: AC
  Administered 2016-02-29: 1000 mg via ORAL
  Filled 2016-02-29: qty 2

## 2016-02-29 MED ORDER — VALACYCLOVIR HCL 1 G PO TABS: 1000.0000 mg | ORAL_TABLET | Freq: Two times a day (BID) | ORAL | Status: DC

## 2016-02-29 MED ORDER — METRONIDAZOLE 0.75 % VA GEL: 1.0000 | Freq: Two times a day (BID) | VAGINAL | Status: DC

## 2016-02-29 MED ORDER — LIDOCAINE HCL 2 % EX GEL
1.0000 "application " | Freq: Once | CUTANEOUS | Status: AC
  Administered 2016-02-29: 1 via TOPICAL
  Filled 2016-02-29: qty 5
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
## 2016-02-29 NOTE — Discharge Instructions (Signed)
Genital Herpes Genital herpes is a common sexually transmitted infection (STI) that is caused by a virus. The virus is spread from person to person through sexual contact. Infection can cause itching, blisters, and sores in the genital area or rectal area. This is called an outbreak. It affects both men and women. Genital herpes is particularly concerning for pregnant women because the virus can be passed to the baby during delivery and cause serious problems. Genital herpes is also a concern for people with a weakened defense (immune) system. Symptoms of genital herpes may last several days and then go away. However, the virus remains in your body, so you may have more outbreaks of symptoms in the future. The time between outbreaks varies and can be months or years. CAUSES Genital herpes is caused by a virus called herpes simplex virus (HSV) type 2 or HSV type 1. These viruses are contagious and are most often spread through sexual contact with an infected person. Sexual contact includes vaginal, anal, and oral sex. RISK FACTORS Risk factors for genital herpes include:  Being sexually active with multiple partners.  Having unprotected sex. SIGNS AND SYMPTOMS Symptoms may include:  Pain and itching in the genital area or rectal area.  Small red bumps that turn into blisters and then turn into sores.  Flu-like symptoms, including:  Fever.  Body aches.  Painful urination.  Vaginal discharge. DIAGNOSIS Genital herpes may be diagnosed by:  Physical exam.  Blood test.  Fluid culture test from an open sore. TREATMENT There is no cure for genital herpes. Oral antiviral medicines may be used to speed up healing and to help prevent the return of symptoms. These medicines can also help to reduce the spread of the virus to sexual partners. HOME CARE INSTRUCTIONS  Keep the affected areas dry and clean.  Take medicines only as directed by your health care provider.  Do not have sexual  contact during active infections. Genital herpes is contagious.  Practice safe sex. Latex condoms and female condoms may help to prevent the spread of the herpes virus.  Avoid rubbing or touching the blisters and sores. If you do touch the blister or sores:  Wash your hands thoroughly.  Do not touch your eyes afterward.  If you become pregnant, tell your health care provider if you have had genital herpes.  Keep all follow-up visits as directed by your health care provider. This is important. PREVENTION  Use condoms. Although anyone can contract genital herpes during sexual contact even with the use of a condom, a condom can provide some protection.  Avoid having multiple sexual partners.  Talk to your sexual partner about any symptoms and past history that either of you may have.  Get tested before you have sex. Ask your partner to do the same.  Recognize the symptoms of genital herpes. Do not have sexual contact if you notice these symptoms. SEEK MEDICAL CARE IF:  Your symptoms are not improving with medicine.  Your symptoms return.  You have new symptoms.  You have a fever.  You have abdominal pain.  You have redness, swelling, or pain in your eye. MAKE SURE YOU:  Understand these instructions.  Will watch your condition.  Will get help right away if you are not doing well or get worse.   This information is not intended to replace advice given to you by your health care provider. Make sure you discuss any questions you have with your health care provider.   Document Released: 09/20/2000   Document Revised: 10/14/2014 Document Reviewed: 02/08/2014 Elsevier Interactive Patient Education 2016 Elsevier Inc. Bacterial Vaginosis Bacterial vaginosis is a vaginal infection that occurs when the normal balance of bacteria in the vagina is disrupted. It results from an overgrowth of certain bacteria. This is the most common vaginal infection in women of childbearing age.  Treatment is important to prevent complications, especially in pregnant women, as it can cause a premature delivery. CAUSES  Bacterial vaginosis is caused by an increase in harmful bacteria that are normally present in smaller amounts in the vagina. Several different kinds of bacteria can cause bacterial vaginosis. However, the reason that the condition develops is not fully understood. RISK FACTORS Certain activities or behaviors can put you at an increased risk of developing bacterial vaginosis, including:  Having a new sex partner or multiple sex partners.  Douching.  Using an intrauterine device (IUD) for contraception. Women do not get bacterial vaginosis from toilet seats, bedding, swimming pools, or contact with objects around them. SIGNS AND SYMPTOMS  Some women with bacterial vaginosis have no signs or symptoms. Common symptoms include:  Grey vaginal discharge.  A fishlike odor with discharge, especially after sexual intercourse.  Itching or burning of the vagina and vulva.  Burning or pain with urination. DIAGNOSIS  Your health care provider will take a medical history and examine the vagina for signs of bacterial vaginosis. A sample of vaginal fluid may be taken. Your health care provider will look at this sample under a microscope to check for bacteria and abnormal cells. A vaginal pH test may also be done.  TREATMENT  Bacterial vaginosis may be treated with antibiotic medicines. These may be given in the form of a pill or a vaginal cream. A second round of antibiotics may be prescribed if the condition comes back after treatment. Because bacterial vaginosis increases your risk for sexually transmitted diseases, getting treated can help reduce your risk for chlamydia, gonorrhea, HIV, and herpes. HOME CARE INSTRUCTIONS   Only take over-the-counter or prescription medicines as directed by your health care provider.  If antibiotic medicine was prescribed, take it as directed.  Make sure you finish it even if you start to feel better.  Tell all sexual partners that you have a vaginal infection. They should see their health care provider and be treated if they have problems, such as a mild rash or itching.  During treatment, it is important that you follow these instructions:  Avoid sexual activity or use condoms correctly.  Do not douche.  Avoid alcohol as directed by your health care provider.  Avoid breastfeeding as directed by your health care provider. SEEK MEDICAL CARE IF:   Your symptoms are not improving after 3 days of treatment.  You have increased discharge or pain.  You have a fever. MAKE SURE YOU:   Understand these instructions.  Will watch your condition.  Will get help right away if you are not doing well or get worse. FOR MORE INFORMATION  Centers for Disease Control and Prevention, Division of STD Prevention: www.cdc.gov/std American Sexual Health Association (ASHA): www.ashastd.org    This information is not intended to replace advice given to you by your health care provider. Make sure you discuss any questions you have with your health care provider.   Document Released: 09/23/2005 Document Revised: 10/14/2014 Document Reviewed: 05/05/2013 Elsevier Interactive Patient Education 2016 Elsevier Inc.  

## 2016-02-29 NOTE — MAU Note (Signed)
Pt presents to MAU with complaints of a yellow discharge. PT states that her clitoris is swollen. Has been evaluated at urgent care and was told she had a UTI

## 2016-03-01 ENCOUNTER — Other Ambulatory Visit: Payer: Self-pay | Admitting: Student

## 2016-03-01 DIAGNOSIS — N760 Acute vaginitis: Principal | ICD-10-CM

## 2016-03-01 DIAGNOSIS — B9689 Other specified bacterial agents as the cause of diseases classified elsewhere: Secondary | ICD-10-CM

## 2016-03-01 MED ORDER — METRONIDAZOLE 500 MG PO TABS: 500.0000 mg | ORAL_TABLET | Freq: Two times a day (BID) | ORAL | Status: DC

## 2016-03-02 LAB — HERPES SIMPLEX VIRUS(HSV) DNA BY PCR
HSV 1 DNA: NEGATIVE
HSV 2 DNA: POSITIVE — AB

## 2016-03-05 LAB — HSV(HERPES SMPLX)ABS-I+II(IGG+IGM)-BLD
HSV 2 Glycoprotein G Ab, IgG: 1.49 index — ABNORMAL HIGH (ref 0.00–0.90)
HSVI/II COMB AB IGM: 4.9 ratio — AB (ref 0.00–0.90)

## 2016-03-08 ENCOUNTER — Telehealth: Payer: Self-pay | Admitting: Advanced Practice Midwife

## 2016-03-08 NOTE — Telephone Encounter (Signed)
Disc

## 2016-03-09 NOTE — Telephone Encounter (Signed)
Discussed HSV-2 positive on PCR and serology. Recommend patient take Valtrex as instructed and reiterated safe sex practices.

## 2016-04-15 ENCOUNTER — Encounter (HOSPITAL_COMMUNITY): Payer: Self-pay

## 2016-04-15 ENCOUNTER — Emergency Department (HOSPITAL_COMMUNITY)
Admission: EM | Admit: 2016-04-15 | Discharge: 2016-04-16 | Disposition: A | Discharge status: Home / Self Care | Payer: 59 | Attending: Emergency Medicine | Admitting: Emergency Medicine

## 2016-04-15 DIAGNOSIS — H5712 Ocular pain, left eye: Secondary | ICD-10-CM | POA: Diagnosis present

## 2016-04-15 DIAGNOSIS — H578 Other specified disorders of eye and adnexa: Secondary | ICD-10-CM | POA: Diagnosis not present

## 2016-04-15 DIAGNOSIS — Z7952 Long term (current) use of systemic steroids: Secondary | ICD-10-CM | POA: Diagnosis not present

## 2016-04-15 DIAGNOSIS — H5789 Other specified disorders of eye and adnexa: Secondary | ICD-10-CM

## 2016-04-15 NOTE — ED Notes (Signed)
Pt complains of left irritation, she has been given prednisone but she continues to have eye swelling and her eye is crusting at night

## 2016-04-15 NOTE — ED Provider Notes (Signed)
CSN: [id number]     Arrival date & time 04/15/16  2128 History  By signing my name below, I, Freida Busman, attest that this documentation has been prepared under the direction and in the presence of non-physician practitioner, Dorthula Matas, PA-C. Electronically Signed: Freida Busman, Scribe. 04/15/2016. 1:03 AM.    Chief Complaint  Patient presents with  . Eye Pain   The history is provided by the patient. No language interpreter was used.     HPI Comments:  Barbara Moore is a 25 y.o. female who presents to the Emergency Department complaining of mild-moderate pain to the left eye x ~4 days. Pt states the symptom began after she touched mold and then proceeded to put in her contacts after. She reports associated puffiness to the left eye and mild watering from the eye; notes symptoms seem worse when she wakes in the AM. Pt went to [facility] 3 days ago.  She was given prednisone tabs which she has been taking with mild temporary relief. She denies vision change. No alleviating factors noted.   Past Medical History  Diagnosis Date  . Seasonal allergies    History reviewed. No pertinent past surgical history. History reviewed. No pertinent family history. Social History  Substance Use Topics  . Smoking status: Never Smoker   . Smokeless tobacco: None  . Alcohol Use: Yes     Comment: social    OB History    No data available     Review of Systems  Constitutional: Negative for fever.  HENT: Positive for facial swelling (eye).   Eyes: Positive for pain and discharge. Negative for visual disturbance.  All other systems reviewed and are negative.  Allergies  Lactose intolerance (gi)  Home Medications   Prior to Admission medications   Medication Sig Start Date End Date Taking? Authorizing Provider  predniSONE (DELTASONE) 20 MG tablet Take 40 mg by mouth daily.    Yes Historical Provider, MD  valACYclovir (VALTREX) 1000 MG tablet Take 1 tablet (1,000 mg  total) by mouth 2 (two) times daily. 02/29/16  Yes Dorathy Kinsman, CNM  metroNIDAZOLE (FLAGYL) 500 MG tablet Take 1 tablet (500 mg total) by mouth 2 (two) times daily. Patient not taking: Reported on 04/15/2016 03/01/16   Judeth Horn, NP   BP 141/75 mmHg  Pulse 88  Temp(Src) 99 F (37.2 C) (Oral)  Resp 18  Ht  (1.651 m)  Wt 136.079 kg  BMI 49.92 kg/m2  SpO2 100% Physical Exam  Constitutional: She is oriented to person, place, and time. She appears well-developed and well-nourished. No distress.  HENT:  Head: Normocephalic and atraumatic.  Eyes: Conjunctivae, EOM and lids are normal. Pupils are equal, round, and reactive to light. Right conjunctiva is not injected. Right conjunctiva has no hemorrhage. Left conjunctiva is not injected. Left conjunctiva has no hemorrhage.  No abnormality on physical exam.  Cardiovascular: Normal rate.   Pulmonary/Chest: Effort normal.  Abdominal: She exhibits no distension.  Neurological: She is alert and oriented to person, place, and time.  Skin: Skin is warm and dry.  Psychiatric: She has a normal mood and affect.  Nursing note and vitals reviewed.   ED Course  Procedures  DIAGNOSTIC STUDIES:  Oxygen Saturation is 100% on RA, normal by my interpretation.    COORDINATION OF CARE:  1:02 AM Will discharge with antibiotic ointment. Discussed treatment plan with pt at bedside and pt agreed to plan.   MDM   Final diagnoses:  Irritation of  left eye    Pt endorses irritation of the eye, no pain. The symptoms are worse first thing in the morning when her eye is crusted shut. The prednisone is not alleviating her symptoms sufficiently. Will have her continue the prednisone and start her on Erythromycin ointment for the eye. Referred to Dr. Edrick Ohing, recommend she call and arrange to be seen tomorrow.  Medications  erythromycin ophthalmic ointment (not administered)    I discussed results, diagnoses and plan with Barbara Moore. They voice  there understanding and questions were answered. We discussed follow-up recommendations and return precautions.   Marlon Peliffany Galina Haddox, PA-C 04/16/16 0109  Dione Boozeavid Glick, MD 04/16/16 [phone]

## 2016-04-16 MED ORDER — ERYTHROMYCIN 5 MG/GM OP OINT
TOPICAL_OINTMENT | Freq: Two times a day (BID) | OPHTHALMIC | Status: DC
  Administered 2016-04-16: 01:00:00 via OPHTHALMIC
  Filled 2016-04-16: qty 3.5

## 2016-04-16 NOTE — Discharge Instructions (Signed)

## 2016-10-05 ENCOUNTER — Emergency Department (HOSPITAL_COMMUNITY)
Admission: EM | Admit: 2016-10-05 | Discharge: 2016-10-06 | Disposition: A | Discharge status: Home / Self Care | Payer: 59 | Attending: Emergency Medicine | Admitting: Emergency Medicine

## 2016-10-05 ENCOUNTER — Encounter (HOSPITAL_COMMUNITY): Payer: Self-pay | Admitting: Emergency Medicine

## 2016-10-05 ENCOUNTER — Emergency Department (HOSPITAL_COMMUNITY): Payer: 59

## 2016-10-05 DIAGNOSIS — S8001XA Contusion of right knee, initial encounter: Secondary | ICD-10-CM | POA: Diagnosis not present

## 2016-10-05 DIAGNOSIS — Y939 Activity, unspecified: Secondary | ICD-10-CM | POA: Diagnosis not present

## 2016-10-05 DIAGNOSIS — Y92410 Unspecified street and highway as the place of occurrence of the external cause: Secondary | ICD-10-CM | POA: Diagnosis not present

## 2016-10-05 DIAGNOSIS — S20211A Contusion of right front wall of thorax, initial encounter: Secondary | ICD-10-CM | POA: Diagnosis not present

## 2016-10-05 DIAGNOSIS — Y999 Unspecified external cause status: Secondary | ICD-10-CM | POA: Diagnosis not present

## 2016-10-05 DIAGNOSIS — S8991XA Unspecified injury of right lower leg, initial encounter: Secondary | ICD-10-CM | POA: Diagnosis present

## 2016-10-05 HISTORY — DX: Obesity, unspecified: E66.9

## 2016-10-05 NOTE — ED Notes (Signed)
Pt came out of room and states she does not want to have xrays done and she is ready to be discharged
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
## 2016-10-06 MED ORDER — CYCLOBENZAPRINE HCL 10 MG PO TABS: 10.0000 mg | ORAL_TABLET | Freq: Two times a day (BID) | ORAL | 0 refills | Status: DC | PRN

## 2016-10-06 MED ORDER — DICLOFENAC SODIUM 50 MG PO TBEC
50.0000 mg | DELAYED_RELEASE_TABLET | Freq: Two times a day (BID) | ORAL | 0 refills | Status: DC
Start: 2016-10-06 — End: 2017-01-11

## 2017-01-11 ENCOUNTER — Ambulatory Visit (HOSPITAL_COMMUNITY)
Admission: EM | Admit: 2017-01-11 | Discharge: 2017-01-11 | Disposition: A | Discharge status: Home / Self Care | Payer: 59 | Attending: Internal Medicine | Admitting: Internal Medicine

## 2017-01-11 ENCOUNTER — Encounter (HOSPITAL_COMMUNITY): Payer: Self-pay | Admitting: Emergency Medicine

## 2017-01-11 DIAGNOSIS — J069 Acute upper respiratory infection, unspecified: Secondary | ICD-10-CM | POA: Diagnosis not present

## 2017-01-11 NOTE — ED Triage Notes (Signed)
PT reports low grade fever and sinus symptoms for 3 days. PT reports she has taken benadryl, zyrtec, and ibuprofen without relief.

## 2017-01-11 NOTE — ED Provider Notes (Signed)
[facility]    CSN: [id number] Arrival date & time: 01/11/17  1211     History   Chief Complaint Chief Complaint  Patient presents with  . Facial Pain    HPI Barbara Moore is a 26 y.o. female. She presents today with 3 day history of low-grade fever, up to 100, improving. Nasal congestion and postnasal drip. Not much coughing, some throat clearing. Migratory ear discomfort. Not much sore throat. Not vomiting. Little bit of loose stool several days ago, but has taken 2 plan Bs in the last couple weeks. Puffiness of the right eyelids, after spending the night with a friend who has a dog. Worried about the flu. Works at [employer] from home during the week, and at [employer] on the weekends. Called out today.    HPI  Past Medical History:  Diagnosis Date  . Obesity   . Seasonal allergies     Patient Active Problem List   Diagnosis Date Noted  . Flank pain 02/22/2014  . Abdominal pain 02/22/2014    History reviewed. No pertinent surgical history.    Home Medications    Prior to Admission medications   Medication Sig Start Date End Date Taking? Authorizing Provider  valACYclovir (VALTREX) 1000 MG tablet Take 1 tablet (1,000 mg total) by mouth 2 (two) times daily. 02/29/16  Yes Dorathy Kinsman, CNM    Family History No family history on file.  Social History Social History  Substance Use Topics  . Smoking status: Never Smoker  . Smokeless tobacco: Never Used  . Alcohol use No     Comment: social      Allergies   Lactose intolerance (gi)   Review of Systems Review of Systems  All other systems reviewed and are negative.    Physical Exam Triage Vital Signs ED Triage Vitals  Enc Vitals Group     BP 01/11/17 1251 119/86     Pulse Rate 01/11/17 1251 88     Resp 01/11/17 1251 16     Temp 01/11/17 1251 98.7 F (37.1 C)     Temp Source 01/11/17 1251 Oral     SpO2 01/11/17 1251 97 %     Weight 01/11/17 1249 300 lb (136.1 kg)     Height  01/11/17 1249  (1.702 m)     Pain Score 01/11/17 1250 5     Pain Loc --    Updated Vital Signs BP 119/86   Pulse 88   Temp 98.7 F (37.1 C) (Oral)   Resp 16   Ht  (1.702 m)   Wt 300 lb (136.1 kg)   LMP 12/11/2016   SpO2 97%   BMI 46.99 kg/m   Physical Exam  Constitutional: She is oriented to person, place, and time. No distress.  HENT:  Head: Atraumatic.  Bilateral TMs mildly dull, no erythema Marked nasal congestion bilaterally with clear/mucousy material present Throat slightly injected  Eyes:  Conjugate gaze observed, no eye redness/discharge  Neck: Neck supple.  Cardiovascular: Normal rate and regular rhythm.   Pulmonary/Chest: No respiratory distress. She has no wheezes. She has no rales.  Lungs clear, symmetric breath sounds  Abdominal: She exhibits no distension.  Musculoskeletal: Normal range of motion.  Neurological: She is alert and oriented to person, place, and time.  Skin: Skin is warm and dry.  Nursing note and vitals reviewed.    UC Treatments / Results   Procedures Procedures (including critical care time) None today   Final Clinical Impressions(s) /  UC Diagnoses   Final diagnoses:  Upper respiratory infection, acute   Anticipate gradual improvement over the next several days.  Recheck if increasing phlegm production/nasal discharge, or new fever >100.5.  Push fluids, rest.  Take ibuprofen for discomfort, fever. Nasal steroid spray may help with ear congestion/sinus congestion.  Note for work, anticipate you will be well enough to return on Monday 4/9.    Eustace Moore, MD 01/12/17 [phone]

## 2017-01-11 NOTE — Discharge Instructions (Addendum)
Anticipate gradual improvement over the next several days.  Recheck if increasing phlegm production/nasal discharge, or new fever >100.5.  Push fluids, rest.  Take ibuprofen for discomfort, fever. Nasal steroid spray may help with ear congestion/sinus congestion.  Note for work, anticipate you will be well enough to return on Monday 4/9.

## 2017-03-08 ENCOUNTER — Other Ambulatory Visit: Payer: Self-pay | Admitting: Obstetrics & Gynecology

## 2017-04-09 ENCOUNTER — Emergency Department (HOSPITAL_COMMUNITY)
Admission: EM | Admit: 2017-04-09 | Discharge: 2017-04-09 | Disposition: A | Discharge status: Home / Self Care | Payer: 59 | Attending: Emergency Medicine | Admitting: Emergency Medicine

## 2017-04-09 ENCOUNTER — Encounter (HOSPITAL_COMMUNITY): Payer: Self-pay

## 2017-04-09 DIAGNOSIS — N898 Other specified noninflammatory disorders of vagina: Secondary | ICD-10-CM | POA: Diagnosis present

## 2017-04-09 DIAGNOSIS — A599 Trichomoniasis, unspecified: Secondary | ICD-10-CM

## 2017-04-09 LAB — URINALYSIS, ROUTINE W REFLEX MICROSCOPIC
BILIRUBIN URINE: NEGATIVE
Bacteria, UA: NONE SEEN
GLUCOSE, UA: NEGATIVE mg/dL
HGB URINE DIPSTICK: NEGATIVE
Ketones, ur: 5 mg/dL — AB
NITRITE: NEGATIVE
Protein, ur: NEGATIVE mg/dL
SPECIFIC GRAVITY, URINE: 1.023 (ref 1.005–1.030)
pH: 5 (ref 5.0–8.0)

## 2017-04-09 LAB — WET PREP, GENITAL
CLUE CELLS WET PREP: NONE SEEN
SPERM: NONE SEEN
Yeast Wet Prep HPF POC: NONE SEEN

## 2017-04-09 LAB — PREGNANCY, URINE: Preg Test, Ur: NEGATIVE

## 2017-04-09 MED ORDER — METRONIDAZOLE 500 MG PO TABS
2000.0000 mg | ORAL_TABLET | Freq: Once | ORAL | Status: AC
  Administered 2017-04-09: 2000 mg via ORAL
  Filled 2017-04-09: qty 4

## 2017-04-09 MED ORDER — LIDOCAINE HCL 1 % IJ SOLN
INTRAMUSCULAR | Status: AC
  Administered 2017-04-09: 2.5 mL
  Filled 2017-04-09: qty 20

## 2017-04-09 MED ORDER — VALACYCLOVIR HCL 1 G PO TABS: 1000.0000 mg | ORAL_TABLET | Freq: Every day | ORAL | 1 refills | Status: AC

## 2017-04-09 MED ORDER — AZITHROMYCIN 250 MG PO TABS
1000.0000 mg | ORAL_TABLET | Freq: Once | ORAL | Status: AC
  Administered 2017-04-09: 1000 mg via ORAL
  Filled 2017-04-09: qty 4

## 2017-04-09 MED ORDER — CEFTRIAXONE SODIUM 250 MG IJ SOLR
250.0000 mg | Freq: Once | INTRAMUSCULAR | Status: AC
  Administered 2017-04-09: 250 mg via INTRAMUSCULAR
  Filled 2017-04-09: qty 250

## 2017-04-09 NOTE — ED Triage Notes (Signed)
Pt complains of vaginal swelling since Thursday, at the beginning she thought it may be a genital herpes outbreak, she took the generic of valtrex and the itching burning sx went away but she says her vagina is more swollen

## 2017-04-09 NOTE — Discharge Instructions (Signed)
Your test for trichomoniasis was positive;.your gonorrhea and chlamydia results are pending. If either of the results are positive, the lab will call you, but you have already been treated. Please do not drink any alcohol after getting treated with metronidazole for trichomoniasis because it can cause profuse vomiting. Trichomoniasis is transmitted through sexual intercourse so it is important to use condoms when having sex. Please let all sexual partners know that you tested positive for trichomoniasis so that they can seek treatment. Even if you have been treated, if you have intercourse with an individual who has trichomoniasis, you can get reinfected. Please call [facility] to get established with an OBGYN for follow-up if you have additional questions or concerns. If you develop new or worsening symptoms including fever, chills, severe pelvic pain, please return to the emergency department for reevaluation. I am also refilling year valacyclovir here today in case you have a flare of herpes.

## 2017-04-09 NOTE — ED Notes (Signed)
EDPA Provider at bedside. 

## 2017-04-09 NOTE — ED Provider Notes (Signed)
[facility] Provider Note   CSN: [id number] Arrival date & time: 04/09/17  0321     History   Chief Complaint Chief Complaint  Patient presents with  . vaginal irritation    HPI Barbara Moore is a 26 y.o. female herpes genitalis who presents to the emergency department with 1 week of vaginal swelling with associated itching. She reports that she was concerned that she was having a flare up of her genital herpes, which her last flare up was approximately one year ago, so she's been taking acyclovir 800 mg 3 times a day. She reports that she has since resolved since applying witch hazel and peroxide to the area up to 5 times today. She reports intermittent dysuria, which may be associated to lesions. She denies fever, chills, vaginal discharge, dyspareunia, back pain, flank pain, abdominal pain, pelvic pain, N/V/D.   LMP was June 19-26. She is currently sexually active with 2 female partners with intermittent protection use. Patient reports that she began using tampons for the first time during this last period. She reports that this is her first period since April after having completed 2 separate courses of Plan B. No h/o of other STIs or DM.   The history is provided by the patient. No language interpreter was used.    Past Medical History:  Diagnosis Date  . Obesity   . Seasonal allergies     Patient Active Problem List   Diagnosis Date Noted  . Flank pain 02/22/2014  . Abdominal pain 02/22/2014    History reviewed. No pertinent surgical history.  OB History    No data available       Home Medications    Prior to Admission medications   Medication Sig Start Date End Date Taking? Authorizing Provider  valACYclovir (VALTREX) 1000 MG tablet Take 1 tablet (1,000 mg total) by mouth daily. 04/09/17 04/14/17  Louann Hopson, Coral ElseMia A, PA-C    Family History History reviewed. No pertinent family history.  Social History Social History  Substance Use Topics  . Smoking  status: Never Smoker  . Smokeless tobacco: Never Used  . Alcohol use No     Comment: social      Allergies   Lactose intolerance (gi)   Review of Systems Review of Systems  Constitutional: Negative for activity change, chills and fever.  Respiratory: Negative for shortness of breath.   Cardiovascular: Negative for chest pain.  Gastrointestinal: Negative for abdominal pain, diarrhea, nausea and vomiting.  Genitourinary: Positive for dysuria and vaginal pain (and swelling). Negative for dyspareunia, flank pain, frequency, genital sores, hematuria, menstrual problem, pelvic pain, urgency, vaginal bleeding and vaginal discharge.  Musculoskeletal: Negative for back pain.  Skin: Negative for rash.  Allergic/Immunologic: Negative for immunocompromised state.     Physical Exam Updated Vital Signs BP 134/79 (BP Location: Left Arm)   Pulse 91   Temp 98.7 F (37.1 C) (Oral)   Resp 16   Ht 5\' 6"  (1.676 m)   Wt 136.1 kg (300 lb)   LMP 03/30/2017   SpO2 98%   BMI 48.42 kg/m   Physical Exam  Constitutional: No distress.  HENT:  Head: Normocephalic.  Eyes: Conjunctivae are normal.  Neck: Neck supple.  Cardiovascular: Normal rate and regular rhythm.  Exam reveals no gallop and no friction rub.   No murmur heard. Pulmonary/Chest: Effort normal. No respiratory distress.  Abdominal: Soft. She exhibits no distension.  Genitourinary: Uterus normal. There is no tenderness or lesion on the right labia. There is  no tenderness or lesion on the left labia. Cervix exhibits no motion tenderness, no discharge and no friability. Right adnexum displays no tenderness and no fullness. Left adnexum displays no tenderness. No erythema, tenderness or bleeding in the vagina. No vaginal discharge found.  Genitourinary Comments: Chaperoned exam. Odor noted. No obvious discharge or bleeding. No CMT.   Lymphadenopathy:       Right: No inguinal adenopathy present.       Left: No inguinal adenopathy present.   Neurological: She is alert.  Skin: Skin is warm. No rash noted.  Psychiatric: Her behavior is normal.  Nursing note and vitals reviewed.  ED Treatments / Results  Labs (all labs ordered are listed, but only abnormal results are displayed) Labs Reviewed  WET PREP, GENITAL - Abnormal; Notable for the following:       Result Value   Trich, Wet Prep PRESENT (*)    WBC, Wet Prep HPF POC FEW (*)    All other components within normal limits  URINALYSIS, ROUTINE W REFLEX MICROSCOPIC - Abnormal; Notable for the following:    Ketones, ur 5 (*)    Leukocytes, UA TRACE (*)    Squamous Epithelial / LPF 0-5 (*)    All other components within normal limits  PREGNANCY, URINE  GC/CHLAMYDIA PROBE AMP ([place]) NOT AT [facility]    EKG  EKG Interpretation None       Radiology No results found.  Procedures Procedures (including critical care time)  Medications Ordered in ED Medications  cefTRIAXone (ROCEPHIN) injection 250 mg (250 mg Intramuscular Given 04/09/17 0835)  azithromycin (ZITHROMAX) tablet 1,000 mg (1,000 mg Oral Given 04/09/17 0834)  metroNIDAZOLE (FLAGYL) tablet 2,000 mg (2,000 mg Oral Given 04/09/17 0834)  lidocaine (XYLOCAINE) 1 % (with pres) injection (2.5 mLs  Given 04/09/17 0835)     Initial Impression / Assessment and Plan / ED Course  I have reviewed the triage vital signs and the nursing notes.  Pertinent labs & imaging results that were available during my care of the patient were reviewed by me and considered in my medical decision making (see chart for details).     Patient with trichomoniasis. Discussed the patient with Dr. Read Drivers, attending physician. Patient to be discharged with instructions to follow up with OBGYN. Discussed importance of using protection when sexually active. Pt understands that they have GC/Chlamydia cultures pending and that they will need to inform all sexual partners if results return positive as well as telling all sexual partners that she  tested positive for trich. Pt has been treated prophylacticly with azithromycin and rocephin due to patient's history. Pt not concerning for PID because hemodynamically stable and no cervical motion tenderness on pelvic exam. Pt has also been treated with flagyl for Bacterial Vaginosis. Pt has been advised to not drink alcohol while on this medication. Strict return precautions given. No acute distress. The patient is stable for discharge at this time.  Final Clinical Impressions(s) / ED Diagnoses   Final diagnoses:  Trichimoniasis    New Prescriptions Discharge Medication List as of 04/09/2017  8:25 AM       Frederik Pear A, PA-C 04/09/17 0900    Molpus, Jonny Ruiz, MD 04/09/17 [phone]

## 2017-04-10 LAB — GC/CHLAMYDIA PROBE AMP (~~LOC~~) NOT AT ARMC
Chlamydia: NEGATIVE
NEISSERIA GONORRHEA: NEGATIVE

## 2017-05-18 ENCOUNTER — Telehealth (HOSPITAL_COMMUNITY): Payer: Self-pay | Admitting: *Deleted

## 2017-05-18 ENCOUNTER — Encounter (HOSPITAL_COMMUNITY): Payer: Self-pay | Admitting: *Deleted

## 2017-05-18 ENCOUNTER — Ambulatory Visit (HOSPITAL_COMMUNITY)
Admission: EM | Admit: 2017-05-18 | Discharge: 2017-05-18 | Disposition: A | Discharge status: Home / Self Care | Payer: 59 | Attending: Family Medicine | Admitting: Family Medicine

## 2017-05-18 DIAGNOSIS — H6505 Acute serous otitis media, recurrent, left ear: Secondary | ICD-10-CM | POA: Diagnosis not present

## 2017-05-18 MED ORDER — AMOXICILLIN 875 MG PO TABS: 875.0000 mg | ORAL_TABLET | Freq: Two times a day (BID) | ORAL | 0 refills | Status: DC

## 2017-05-18 NOTE — ED Triage Notes (Signed)
Patient reports left ear pain x 2 weeks. Low grade fevers at home.

## 2017-05-18 NOTE — ED Provider Notes (Signed)
[facility]    CSN: [id number] Arrival date & time: 05/18/17  1210     History   Chief Complaint Chief Complaint  Patient presents with  . Otalgia    HPI Barbara Moore is a 26 y.o. female.   Patient reports left ear pain x 2 weeks. Low grade fevers at home. She's had no loss of hearing or dizziness.  Patient works from home for [employer]  Patient has a history of recurrent sinus infections maybe twice a year.      Past Medical History:  Diagnosis Date  . Obesity   . Seasonal allergies     Patient Active Problem List   Diagnosis Date Noted  . Flank pain 02/22/2014  . Abdominal pain 02/22/2014    History reviewed. No pertinent surgical history.  OB History    No data available       Home Medications    Prior to Admission medications   Medication Sig Start Date End Date Taking? Authorizing Provider  amoxicillin (AMOXIL) 875 MG tablet Take 1 tablet (875 mg total) by mouth 2 (two) times daily. 05/18/17   Elvina Sidle, MD    Family History History reviewed. No pertinent family history.  Social History Social History  Substance Use Topics  . Smoking status: Never Smoker  . Smokeless tobacco: Never Used  . Alcohol use No     Comment: social      Allergies   Lactose intolerance (gi)   Review of Systems Review of Systems  Constitutional: Positive for fatigue.  HENT: Positive for ear pain.   Musculoskeletal: Positive for myalgias.  All other systems reviewed and are negative.    Physical Exam Triage Vital Signs ED Triage Vitals  Enc Vitals Group     BP 05/18/17 1258 112/72     Pulse Rate 05/18/17 1258 (!) 105     Resp 05/18/17 1258 16     Temp 05/18/17 1258 99 F (37.2 C)     Temp Source 05/18/17 1258 Oral     SpO2 05/18/17 1258 100 %     Weight --      Height --      Head Circumference --      Peak Flow --      Pain Score 05/18/17 1259 7     Pain Loc --      Pain Edu? --      Excl. in GC? --     No data found.   Updated Vital Signs BP 112/72 (BP Location: Left Arm)   Pulse (!) 105   Temp 99 F (37.2 C) (Oral)   Resp 16   SpO2 100%    Physical Exam  Constitutional: She is oriented to person, place, and time. She appears well-developed and well-nourished.  HENT:  Right Ear: External ear normal.  Left Ear: External ear normal.  Mouth/Throat: Oropharynx is clear and moist.  Left TM is retracted and erythematous  Eyes: Pupils are equal, round, and reactive to light. Conjunctivae are normal.  Neck: Normal range of motion. Neck supple.  Mild left cervical adenopathy  Pulmonary/Chest: Effort normal.  Musculoskeletal: Normal range of motion.  Lymphadenopathy:    She has cervical adenopathy.  Neurological: She is alert and oriented to person, place, and time.  Skin: Skin is warm and dry.  Nursing note and vitals reviewed.    UC Treatments / Results  Labs (all labs ordered are listed, but only abnormal results are displayed) Labs Reviewed - No  data to display  EKG  EKG Interpretation None       Radiology No results found.  Procedures Procedures (including critical care time)  Medications Ordered in UC Medications - No data to display   Initial Impression / Assessment and Plan / UC Course  I have reviewed the triage vital signs and the nursing notes.  Pertinent labs & imaging results that were available during my care of the patient were reviewed by me and considered in my medical decision making (see chart for details).     Final Clinical Impressions(s) / UC Diagnoses   Final diagnoses:  Recurrent acute serous otitis media of left ear    New Prescriptions New Prescriptions   AMOXICILLIN (AMOXIL) 875 MG TABLET    Take 1 tablet (875 mg total) by mouth 2 (two) times daily.     Controlled Substance Prescriptions [place] Controlled Substance Registry consulted? Not Applicable   Elvina SidleLauenstein, Khadim Lundberg, MD 05/18/17 1306

## 2017-06-17 ENCOUNTER — Ambulatory Visit (HOSPITAL_COMMUNITY)
Admission: EM | Admit: 2017-06-17 | Discharge: 2017-06-17 | Disposition: A | Discharge status: Home / Self Care | Payer: 59 | Attending: Family Medicine | Admitting: Family Medicine

## 2017-06-17 ENCOUNTER — Encounter (HOSPITAL_COMMUNITY): Payer: Self-pay | Admitting: Emergency Medicine

## 2017-06-17 DIAGNOSIS — E669 Obesity, unspecified: Secondary | ICD-10-CM | POA: Diagnosis not present

## 2017-06-17 DIAGNOSIS — N760 Acute vaginitis: Secondary | ICD-10-CM | POA: Diagnosis present

## 2017-06-17 DIAGNOSIS — Z6841 Body Mass Index (BMI) 40.0 and over, adult: Secondary | ICD-10-CM | POA: Diagnosis not present

## 2017-06-17 LAB — POCT URINALYSIS DIP (DEVICE)
BILIRUBIN URINE: NEGATIVE
Glucose, UA: NEGATIVE mg/dL
Hgb urine dipstick: NEGATIVE
Ketones, ur: NEGATIVE mg/dL
LEUKOCYTES UA: NEGATIVE
NITRITE: NEGATIVE
Protein, ur: NEGATIVE mg/dL
Specific Gravity, Urine: >=1.03 (ref 1.005–1.030)
Urobilinogen, UA: 0.2 mg/dL (ref 0.0–1.0)
pH: 5.5 (ref 5.0–8.0)

## 2017-06-17 LAB — POCT PREGNANCY, URINE: PREG TEST UR: NEGATIVE

## 2017-06-17 MED ORDER — METRONIDAZOLE 500 MG PO TABS: 500.0000 mg | ORAL_TABLET | Freq: Two times a day (BID) | ORAL | 0 refills | Status: DC

## 2017-06-17 NOTE — Discharge Instructions (Signed)
Take the medicine with food, avoid alcohol. Confirmatory test takes around two days.

## 2017-06-17 NOTE — ED Provider Notes (Signed)
[facility]    CSN: [id number] Arrival date & time: 06/17/17  1813     History   Chief Complaint Chief Complaint  Patient presents with  . Vaginal Itching    HPI Barbara Moore is a 26 y.o. female.   26 yo woman presents with vaginal discomfort and recent exposure to trichamonas.  No discharge, fever or abd pain  Patient works at home for [employer]      Past Medical History:  Diagnosis Date  . Obesity   . Seasonal allergies     Patient Active Problem List   Diagnosis Date Noted  . Flank pain 02/22/2014  . Abdominal pain 02/22/2014    History reviewed. No pertinent surgical history.  OB History    No data available       Home Medications    Prior to Admission medications   Medication Sig Start Date End Date Taking? Authorizing Provider  metroNIDAZOLE (FLAGYL) 500 MG tablet Take 1 tablet (500 mg total) by mouth 2 (two) times daily. 06/17/17   Elvina SidleLauenstein, Abi Shoults, MD    Family History No family history on file.  Social History Social History  Substance Use Topics  . Smoking status: Never Smoker  . Smokeless tobacco: Never Used  . Alcohol use No     Comment: social      Allergies   Lactose intolerance (gi)   Review of Systems Review of Systems  Genitourinary: Positive for vaginal pain.  All other systems reviewed and are negative.    Physical Exam Triage Vital Signs ED Triage Vitals  Enc Vitals Group     BP 06/17/17 1901 112/78     Pulse Rate 06/17/17 1901 98     Resp 06/17/17 1901 16     Temp 06/17/17 1901 99.9 F (37.7 C)     Temp Source 06/17/17 1901 Oral     SpO2 06/17/17 1901 100 %     Weight 06/17/17 1859 300 lb (136.1 kg)     Height 06/17/17 1859 5\' 7"  (1.702 m)     Head Circumference --      Peak Flow --      Pain Score 06/17/17 1859 0     Pain Loc --      Pain Edu? --      Excl. in GC? --    No data found.   Updated Vital Signs BP 112/78   Pulse 98   Temp 99.9 F (37.7 C) (Oral)   Resp 16    Ht 5\' 7"  (1.702 m)   Wt 300 lb (136.1 kg)   LMP 05/22/2017   SpO2 100%   BMI 46.99 kg/m    Physical Exam  Constitutional: She is oriented to person, place, and time. She appears well-developed and well-nourished.  HENT:  Right Ear: External ear normal.  Left Ear: External ear normal.  Mouth/Throat: Oropharynx is clear and moist.  Eyes: Pupils are equal, round, and reactive to light. Conjunctivae are normal.  Neck: Normal range of motion.  Pulmonary/Chest: Effort normal.  Musculoskeletal: Normal range of motion.  Neurological: She is alert and oriented to person, place, and time.  Skin: Skin is warm and dry.  Nursing note and vitals reviewed.    UC Treatments / Results  Labs (all labs ordered are listed, but only abnormal results are displayed) Labs Reviewed  URINE CYTOLOGY ANCILLARY ONLY    EKG  EKG Interpretation None       Radiology No results found.  Procedures Procedures (  including critical care time)  Medications Ordered in UC Medications - No data to display   Initial Impression / Assessment and Plan / UC Course  I have reviewed the triage vital signs and the nursing notes.  Pertinent labs & imaging results that were available during my care of the patient were reviewed by me and considered in my medical decision making (see chart for details).     Final Clinical Impressions(s) / UC Diagnoses   Final diagnoses:  Vaginitis and vulvovaginitis    New Prescriptions New Prescriptions   METRONIDAZOLE (FLAGYL) 500 MG TABLET    Take 1 tablet (500 mg total) by mouth 2 (two) times daily.     Controlled Substance Prescriptions [place] Controlled Substance Registry consulted? Not Applicable   Elvina Sidle, MD 06/17/17 Ernestina [place]

## 2017-06-17 NOTE — ED Triage Notes (Signed)
PT reports vaginal itching for 3-4 days. No discharge. PT's partner tested positive for trich.

## 2017-06-19 LAB — URINE CYTOLOGY ANCILLARY ONLY
Chlamydia: NEGATIVE
Neisseria Gonorrhea: NEGATIVE
Trichomonas: NEGATIVE

## 2017-12-27 ENCOUNTER — Other Ambulatory Visit: Payer: Self-pay

## 2017-12-27 ENCOUNTER — Emergency Department (HOSPITAL_COMMUNITY)
Admission: EM | Admit: 2017-12-27 | Discharge: 2017-12-27 | Disposition: A | Discharge status: Home / Self Care | Payer: 59 | Attending: Emergency Medicine | Admitting: Emergency Medicine

## 2017-12-27 ENCOUNTER — Emergency Department (HOSPITAL_COMMUNITY): Payer: 59

## 2017-12-27 ENCOUNTER — Encounter (HOSPITAL_COMMUNITY): Payer: Self-pay

## 2017-12-27 DIAGNOSIS — Y939 Activity, unspecified: Secondary | ICD-10-CM | POA: Insufficient documentation

## 2017-12-27 DIAGNOSIS — Y929 Unspecified place or not applicable: Secondary | ICD-10-CM | POA: Insufficient documentation

## 2017-12-27 DIAGNOSIS — Y999 Unspecified external cause status: Secondary | ICD-10-CM | POA: Diagnosis not present

## 2017-12-27 DIAGNOSIS — Z041 Encounter for examination and observation following transport accident: Secondary | ICD-10-CM | POA: Diagnosis present

## 2017-12-27 DIAGNOSIS — S39012A Strain of muscle, fascia and tendon of lower back, initial encounter: Secondary | ICD-10-CM | POA: Diagnosis not present

## 2017-12-27 LAB — POC URINE PREG, ED: PREG TEST UR: NEGATIVE

## 2017-12-27 MED ORDER — METHOCARBAMOL 500 MG PO TABS: 500.0000 mg | ORAL_TABLET | Freq: Two times a day (BID) | ORAL | 0 refills | Status: DC

## 2017-12-27 MED ORDER — METHOCARBAMOL 500 MG PO TABS
500.0000 mg | ORAL_TABLET | Freq: Once | ORAL | Status: AC
  Administered 2017-12-27: 500 mg via ORAL
  Filled 2017-12-27: qty 1

## 2017-12-27 MED ORDER — NAPROXEN 375 MG PO TABS: 375.0000 mg | ORAL_TABLET | Freq: Two times a day (BID) | ORAL | 0 refills | Status: DC
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
## 2018-03-27 ENCOUNTER — Encounter (HOSPITAL_COMMUNITY): Payer: Self-pay | Admitting: Emergency Medicine

## 2018-03-27 ENCOUNTER — Ambulatory Visit (HOSPITAL_COMMUNITY)
Admission: EM | Admit: 2018-03-27 | Discharge: 2018-03-27 | Disposition: A | Discharge status: Home / Self Care | Payer: Managed Care, Other (non HMO) | Attending: Family Medicine | Admitting: Family Medicine

## 2018-03-27 DIAGNOSIS — B349 Viral infection, unspecified: Secondary | ICD-10-CM

## 2018-03-27 MED ORDER — BENZONATATE 100 MG PO CAPS: 100.0000 mg | ORAL_CAPSULE | Freq: Three times a day (TID) | ORAL | 0 refills | Status: DC

## 2018-03-27 MED ORDER — FLUTICASONE PROPIONATE 50 MCG/ACT NA SUSP: 2.0000 | Freq: Every day | NASAL | 0 refills | Status: DC

## 2018-03-27 MED ORDER — IPRATROPIUM BROMIDE 0.06 % NA SOLN: 2.0000 | Freq: Four times a day (QID) | NASAL | 0 refills | Status: DC

## 2018-03-27 NOTE — Discharge Instructions (Signed)
Tessalon for cough. Start flonase, atrovent nasal spray, zyrtec for nasal congestion/drainage. You can use over the counter nasal saline rinse such as neti pot for nasal congestion. Keep hydrated, your urine should be clear to pale yellow in color. Tylenol/motrin for fever and pain. Monitor for any worsening of symptoms, chest pain, shortness of breath, wheezing, swelling of the throat, follow up for reevaluation.  ° °For sore throat/cough try using a honey-based tea. Use 3 teaspoons of honey with juice squeezed from half lemon. Place shaved pieces of ginger into 1/2-1 cup of water and warm over stove top. Then mix the ingredients and repeat every 4 hours as needed. ° °

## 2018-03-27 NOTE — ED Triage Notes (Signed)
Pt c/o sinus problems for the last 3-4 days, states shes lost her voice and has a lot of facial pain.

## 2018-03-27 NOTE — ED Provider Notes (Signed)
[facility]    CSN: [id number] Arrival date & time: 03/27/18  1935     History   Chief Complaint No chief complaint on file.   HPI Barbara Moore is a 27 y.o. female.   [id number] year old female comes in for 3 to 4-day history of URI symptoms.  Has had nasal congestion, rhinorrhea, cough, sinus pressure, ear pain.  Has had a mild sore throat with hoarseness to the voice.  Has had chills without known fevers.  Has been taking OTC cold medication, Afrin, Zyrtec with little relief.  Never smoker.  No known sick contact.     Past Medical History:  Diagnosis Date  . Obesity   . Seasonal allergies     Patient Active Problem List   Diagnosis Date Noted  . Flank pain 02/22/2014  . Abdominal pain 02/22/2014    History reviewed. No pertinent surgical history.  OB History   None      Home Medications    Prior to Admission medications   Medication Sig Start Date End Date Taking? Authorizing Provider  benzonatate (TESSALON) 100 MG capsule Take 1 capsule (100 mg total) by mouth every 8 (eight) hours. 03/27/18   Cathie HoopsYu, Donasia Wimes V, PA-C  fluticasone (FLONASE) 50 MCG/ACT nasal spray Place 2 sprays into both nostrils daily. 03/27/18   Cathie HoopsYu, Roark Rufo V, PA-C  ipratropium (ATROVENT) 0.06 % nasal spray Place 2 sprays into both nostrils 4 (four) times daily. 03/27/18   Cathie HoopsYu, Shandra Szymborski V, PA-C  methocarbamol (ROBAXIN) 500 MG tablet Take 1 tablet (500 mg total) by mouth 2 (two) times daily. Patient not taking: Reported on 03/27/2018 12/27/17   Maczis, Elmer SowMichael M, PA-C  metroNIDAZOLE (FLAGYL) 500 MG tablet Take 1 tablet (500 mg total) by mouth 2 (two) times daily. Patient not taking: Reported on 03/27/2018 06/17/17   Elvina SidleLauenstein, Kurt, MD  naproxen (NAPROSYN) 375 MG tablet Take 1 tablet (375 mg total) by mouth 2 (two) times daily. Patient not taking: Reported on 03/27/2018 12/27/17   Jacinto HalimMaczis, Michael M, PA-C    Family History No family history on file.  Social History Social History   Tobacco Use  . Smoking  status: Never Smoker  . Smokeless tobacco: Never Used  Substance Use Topics  . Alcohol use: No    Comment: social   . Drug use: No     Allergies   Lactose intolerance (gi)   Review of Systems Review of Systems  Reason unable to perform ROS: See HPI as above.     Physical Exam Triage Vital Signs ED Triage Vitals  Enc Vitals Group     BP 03/27/18 1942 127/74     Pulse Rate 03/27/18 1941 87     Resp 03/27/18 1941 16     Temp 03/27/18 1941 98.3 F (36.8 C)     Temp src --      SpO2 03/27/18 1941 100 %     Weight --      Height --      Head Circumference --      Peak Flow --      Pain Score --      Pain Loc --      Pain Edu? --      Excl. in GC? --    No data found.  Updated Vital Signs BP 127/74   Pulse 87   Temp 98.3 F (36.8 C)   Resp 16   SpO2 100%   Physical Exam  Constitutional: She is oriented to person, place,  and time. She appears well-developed and well-nourished. No distress.  HENT:  Head: Normocephalic and atraumatic.  Right Ear: Tympanic membrane, external ear and ear canal normal. Tympanic membrane is not erythematous and not bulging.  Left Ear: Tympanic membrane, external ear and ear canal normal. Tympanic membrane is not erythematous and not bulging.  Nose: Mucosal edema and rhinorrhea present. Right sinus exhibits maxillary sinus tenderness and frontal sinus tenderness. Left sinus exhibits maxillary sinus tenderness and frontal sinus tenderness.  Mouth/Throat: Uvula is midline, oropharynx is clear and moist and mucous membranes are normal.  Eyes: Pupils are equal, round, and reactive to light. Conjunctivae are normal.  Neck: Normal range of motion. Neck supple.  Cardiovascular: Normal rate, regular rhythm and normal heart sounds. Exam reveals no gallop and no friction rub.  No murmur heard. Pulmonary/Chest: Effort normal and breath sounds normal. She has no decreased breath sounds. She has no wheezes. She has no rhonchi. She has no rales.    Lymphadenopathy:    She has no cervical adenopathy.  Neurological: She is alert and oriented to person, place, and time.  Skin: Skin is warm and dry. She is not diaphoretic.  Psychiatric: She has a normal mood and affect. Her behavior is normal. Judgment normal.     UC Treatments / Results  Labs (all labs ordered are listed, but only abnormal results are displayed) Labs Reviewed - No data to display  EKG None  Radiology No results found.  Procedures Procedures (including critical care time)  Medications Ordered in UC Medications - No data to display  Initial Impression / Assessment and Plan / UC Course  I have reviewed the triage vital signs and the nursing notes.  Pertinent labs & imaging results that were available during my care of the patient were reviewed by me and considered in my medical decision making (see chart for details).    Discussed with patient history and exam most consistent with viral URI. Symptomatic treatment as needed. Push fluids. Return precautions given.   Final Clinical Impressions(s) / UC Diagnoses   Final diagnoses:  Viral illness    ED Prescriptions    Medication Sig Dispense Auth. Provider   fluticasone (FLONASE) 50 MCG/ACT nasal spray Place 2 sprays into both nostrils daily. 1 g Ronnetta Currington V, PA-C   ipratropium (ATROVENT) 0.06 % nasal spray Place 2 sprays into both nostrils 4 (four) times daily. 15 mL Manette Doto V, PA-C   benzonatate (TESSALON) 100 MG capsule Take 1 capsule (100 mg total) by mouth every 8 (eight) hours. 21 capsule Threasa Alpha, [place] 03/27/18 2024

## 2018-04-05 ENCOUNTER — Ambulatory Visit (HOSPITAL_COMMUNITY)
Admission: EM | Admit: 2018-04-05 | Discharge: 2018-04-05 | Disposition: A | Discharge status: Home / Self Care | Payer: Managed Care, Other (non HMO) | Attending: Family Medicine | Admitting: Family Medicine

## 2018-04-05 ENCOUNTER — Encounter (HOSPITAL_COMMUNITY): Payer: Self-pay | Admitting: Emergency Medicine

## 2018-04-05 DIAGNOSIS — J019 Acute sinusitis, unspecified: Secondary | ICD-10-CM | POA: Diagnosis not present

## 2018-04-05 DIAGNOSIS — R05 Cough: Secondary | ICD-10-CM | POA: Diagnosis present

## 2018-04-05 DIAGNOSIS — E739 Lactose intolerance, unspecified: Secondary | ICD-10-CM | POA: Diagnosis not present

## 2018-04-05 LAB — POCT RAPID STREP A: Streptococcus, Group A Screen (Direct): NEGATIVE

## 2018-04-05 MED ORDER — AMOXICILLIN-POT CLAVULANATE 875-125 MG PO TABS: 1.0000 | ORAL_TABLET | Freq: Two times a day (BID) | ORAL | 0 refills | Status: AC

## 2018-04-05 NOTE — ED Provider Notes (Signed)
[facility]    CSN: [id number] Arrival date & time: 04/05/18  1433     History   Chief Complaint No chief complaint on file.   HPI Barbara Moore is a 27 y.o. female no significant past medical history presenting today for evaluation of URI symptoms.  Patient has had congestion, cough, sinus pressure and facial pain over the past week.  She was using here approximately 1 week ago and treated for viral URI.  She has been taking Mucinex, Flonase, Sudafed with mild improvement.  She states that she has developed a hoarse voice and is worried about her vocal cords.  She denies any fever.  HPI  Past Medical History:  Diagnosis Date  . Obesity   . Seasonal allergies     Patient Active Problem List   Diagnosis Date Noted  . Flank pain 02/22/2014  . Abdominal pain 02/22/2014    History reviewed. No pertinent surgical history.  OB History   None      Home Medications    Prior to Admission medications   Medication Sig Start Date End Date Taking? Authorizing Provider  amoxicillin-clavulanate (AUGMENTIN) 875-125 MG tablet Take 1 tablet by mouth every 12 (twelve) hours for 7 days. 04/05/18 04/12/18  Wieters, Hallie C, PA-C  benzonatate (TESSALON) 100 MG capsule Take 1 capsule (100 mg total) by mouth every 8 (eight) hours. 03/27/18   Cathie HoopsYu, Amy V, PA-C  fluticasone (FLONASE) 50 MCG/ACT nasal spray Place 2 sprays into both nostrils daily. 03/27/18   Cathie HoopsYu, Amy V, PA-C  ipratropium (ATROVENT) 0.06 % nasal spray Place 2 sprays into both nostrils 4 (four) times daily. 03/27/18   Belinda FisherYu, Amy V, PA-C    Family History No family history on file.  Social History Social History   Tobacco Use  . Smoking status: Never Smoker  . Smokeless tobacco: Never Used  Substance Use Topics  . Alcohol use: No    Comment: social   . Drug use: No     Allergies   Lactose intolerance (gi)   Review of Systems Review of Systems  Constitutional: Negative for activity change, appetite change,  chills, fatigue and fever.  HENT: Positive for congestion, rhinorrhea, sinus pressure, sore throat and voice change. Negative for ear pain and trouble swallowing.   Eyes: Negative for discharge and redness.  Respiratory: Positive for cough. Negative for chest tightness and shortness of breath.   Cardiovascular: Negative for chest pain.  Gastrointestinal: Negative for abdominal pain, diarrhea, nausea and vomiting.  Musculoskeletal: Negative for myalgias.  Skin: Negative for rash.  Neurological: Negative for dizziness, light-headedness and headaches.     Physical Exam Triage Vital Signs ED Triage Vitals [04/05/18 1558]  Enc Vitals Group     BP 126/68     Pulse Rate 83     Resp 18     Temp 98.4 F (36.9 C)     Temp src      SpO2 100 %     Weight      Height      Head Circumference      Peak Flow      Pain Score      Pain Loc      Pain Edu?      Excl. in GC?    No data found.  Updated Vital Signs BP 126/68   Pulse 83   Temp 98.4 F (36.9 C)   Resp 18   SpO2 100%   Visual Acuity Right Eye Distance:  Left Eye Distance:   Bilateral Distance:    Right Eye Near:   Left Eye Near:    Bilateral Near:     Physical Exam  Constitutional: She appears well-developed and well-nourished. No distress.  HENT:  Head: Normocephalic and atraumatic.  Bilateral ears without tenderness to palpation of external auricle, tragus and mastoid, EAC's without erythema or swelling, TM's with good bony landmarks and cone of light. Non erythematous.  Oral mucosa pink and moist, bilateral tonsils significantly enlarged, not erythematous, white exudate present. Posterior pharynx patent and nonerythematous, no uvula deviation or swelling.  Voice is hoarse   Eyes: Conjunctivae are normal.  Neck: Neck supple.  Cardiovascular: Normal rate and regular rhythm.  No murmur heard. Pulmonary/Chest: Effort normal and breath sounds normal. No respiratory distress.  Breathing comfortably at rest, CTABL,  no wheezing, rales or other adventitious sounds auscultated  Abdominal: Soft. There is no tenderness.  Musculoskeletal: She exhibits no edema.  Neurological: She is alert.  Skin: Skin is warm and dry.  Psychiatric: She has a normal mood and affect.  Nursing note and vitals reviewed.    UC Treatments / Results  Labs (all labs ordered are listed, but only abnormal results are displayed) Labs Reviewed  CULTURE, GROUP A STREP [facility])  POCT RAPID STREP A    EKG None  Radiology No results found.  Procedures Procedures (including critical care time)  Medications Ordered in UC Medications - No data to display  Initial Impression / Assessment and Plan / UC Course  I have reviewed the triage vital signs and the nursing notes.  Pertinent labs & imaging results that were available during my care of the patient were reviewed by me and considered in my medical decision making (see chart for details).     Patient with URI symptoms, failing symptomatic treatment for over 1 week.  Will initiate Augmentin to treat for bacterial sinusitis.  Discussed with full records likely irritated from drainage/coughing.  Recommended honey/lemon tea.  Continue symptomatic management previously, advised to take Mucinex or Sudafed to avoid too much drying.Discussed strict return precautions. Patient verbalized understanding and is agreeable with plan.  Final Clinical Impressions(s) / UC Diagnoses   Final diagnoses:  Acute sinusitis with symptoms > 10 days     Discharge Instructions     Please begin Augmentin twice daily for the next week Please use honey/lemon mixed in hot tea to help Synthroid and help with her voice, expect this to return back to normal after symptoms resolving Continue medicines you are taking for your congestion and cough   ED Prescriptions    Medication Sig Dispense Auth. Provider   amoxicillin-clavulanate (AUGMENTIN) 875-125 MG tablet Take 1 tablet by mouth every 12  (twelve) hours for 7 days. 14 tablet Wieters, Harrison C, PA-C     Controlled Substance Prescriptions [place] Controlled Substance Registry consulted? Not Applicable   Lew Dawes, [place] 04/05/18 2133

## 2018-04-05 NOTE — ED Triage Notes (Signed)
Pt seen here 1 week ago for same, sinus issues, hoarse voice, facial pain. Not any better

## 2018-04-05 NOTE — Discharge Instructions (Signed)
Please begin Augmentin twice daily for the next week Please use honey/lemon mixed in hot tea to help Synthroid and help with her voice, expect this to return back to normal after symptoms resolving Continue medicines you are taking for your congestion and cough

## 2018-04-08 LAB — CULTURE, GROUP A STREP (THRC)

## 2018-06-23 ENCOUNTER — Emergency Department (HOSPITAL_BASED_OUTPATIENT_CLINIC_OR_DEPARTMENT_OTHER)
Admission: EM | Admit: 2018-06-23 | Discharge: 2018-06-23 | Disposition: A | Discharge status: Home / Self Care | Payer: Managed Care, Other (non HMO) | Attending: Emergency Medicine | Admitting: Emergency Medicine

## 2018-06-23 ENCOUNTER — Other Ambulatory Visit: Payer: Self-pay

## 2018-06-23 ENCOUNTER — Encounter (HOSPITAL_BASED_OUTPATIENT_CLINIC_OR_DEPARTMENT_OTHER): Payer: Self-pay | Admitting: Emergency Medicine

## 2018-06-23 DIAGNOSIS — M79644 Pain in right finger(s): Secondary | ICD-10-CM | POA: Diagnosis present

## 2018-06-23 DIAGNOSIS — Z79899 Other long term (current) drug therapy: Secondary | ICD-10-CM | POA: Diagnosis not present

## 2018-06-23 DIAGNOSIS — L03011 Cellulitis of right finger: Secondary | ICD-10-CM | POA: Insufficient documentation

## 2018-06-23 NOTE — ED Triage Notes (Signed)
Pt reports swelling to right middle finger.

## 2018-06-23 NOTE — ED Provider Notes (Signed)
[facility] EMERGENCY DEPARTMENT Provider Note  CSN: [id number] Arrival date & time: 06/23/18 0236  Chief Complaint(s) Hand Pain  HPI Barbara Moore is a 27 y.o. female with a history of recurrent paronychias presents to the emergency department with right middle finger pain and swelling for 2 days.  Typical for paronychia as per patient to drain it at home.  Endorsing increased pain aching/throbbing in nature and exacerbated with palpation of the inflamed area.  No alleviating factors.  Denies any trauma.  No fevers or chills.  No loss of sensation or weakness.  HPI  Past Medical History Past Medical History:  Diagnosis Date  . Obesity   . Seasonal allergies    Patient Active Problem List   Diagnosis Date Noted  . Flank pain 02/22/2014  . Abdominal pain 02/22/2014   Home Medication(s) Prior to Admission medications   Medication Sig Start Date End Date Taking? Authorizing Provider  benzonatate (TESSALON) 100 MG capsule Take 1 capsule (100 mg total) by mouth every 8 (eight) hours. 03/27/18   Cathie HoopsYu, Amy V, PA-C  fluticasone (FLONASE) 50 MCG/ACT nasal spray Place 2 sprays into both nostrils daily. 03/27/18   Cathie HoopsYu, Amy V, PA-C  ipratropium (ATROVENT) 0.06 % nasal spray Place 2 sprays into both nostrils 4 (four) times daily. 03/27/18   Belinda FisherYu, Amy V, PA-C                                                                                                                                    Past Surgical History History reviewed. No pertinent surgical history. Family History No family history on file.  Social History Social History   Tobacco Use  . Smoking status: Never Smoker  . Smokeless tobacco: Never Used  Substance Use Topics  . Alcohol use: No    Comment: social   . Drug use: No   Allergies Lactose intolerance (gi)  Review of Systems Review of Systems As noted in HPI Physical Exam Vital Signs  I have reviewed the triage vital signs BP 133/67 (BP Location: Left Arm)    Pulse (!) 102   Temp 98.3 F (36.8 C) (Oral)   Resp 20   Ht 5\' 7"  (1.702 m)   Wt (!) 142.9 kg   SpO2 100%   BMI 49.34 kg/m   Physical Exam  Constitutional: She is oriented to person, place, and time. She appears well-developed and well-nourished. No distress.  Obese female  HENT:  Head: Normocephalic and atraumatic.  Right Ear: External ear normal.  Left Ear: External ear normal.  Nose: Nose normal.  Eyes: Conjunctivae and EOM are normal. No scleral icterus.  Neck: Normal range of motion and phonation normal.  Cardiovascular: Normal rate and regular rhythm.  Pulmonary/Chest: Effort normal. No stridor. No respiratory distress.  Abdominal: She exhibits no distension.  Musculoskeletal: Normal range of motion. She exhibits no edema.       Hands: Neurological: She is  alert and oriented to person, place, and time.  Skin: She is not diaphoretic.  Psychiatric: She has a normal mood and affect. Her behavior is normal.  Vitals reviewed.   ED Results and Treatments Labs (all labs ordered are listed, but only abnormal results are displayed) Labs Reviewed - No data to display                                                                                                                       EKG  EKG Interpretation  Date/Time:    Ventricular Rate:    PR Interval:    QRS Duration:   QT Interval:    QTC Calculation:   R Axis:     Text Interpretation:        Radiology No results found. Pertinent labs & imaging results that were available during my care of the patient were reviewed by me and considered in my medical decision making (see chart for details).  Medications Ordered in ED Medications - No data to display                                                                                                                                  Procedures .Marland KitchenIncision and Drainage Date/Time: 06/23/2018 4:00 AM Performed by: Nira Conn, MD Authorized by: Nira Conn, MD   Consent:    Consent obtained:  Verbal   Consent given by:  Patient   Risks discussed:  Bleeding, incomplete drainage and pain   Alternatives discussed:  Delayed treatment Location:    Indications for incision and drainage: paronychia.   Location:  Upper extremity   Upper extremity location:  Finger   Finger location:  R long finger Pre-procedure details:    Skin preparation:  Chloraprep Procedure type:    Complexity:  Simple Procedure details:    Needle aspiration: no     Incision types:  Single straight (under cuticle)   Incision depth:  Subcutaneous   Scalpel size: 18 g needle.   Drainage:  Purulent   Drainage amount:  Moderate   Packing materials:  None Post-procedure details:    Patient tolerance of procedure:  Tolerated well, no immediate complications    (including critical care time)  Medical Decision Making / ED Course I have reviewed the nursing notes for this encounter and the patient's prior records (if available in EHR or on provided paperwork).    Uncomplicated  paronychia.  No felon or evidence of flexor tenosynovitis.  I&D as above.  The patient is safe for discharge with strict return precautions.   Final Clinical Impression(s) / ED Diagnoses Final diagnoses:  Paronychia of finger of right hand    Disposition: Discharge  Condition: Good  I have discussed the results, Dx and Tx plan with the patient who expressed understanding and agree(s) with the plan. Discharge instructions discussed at great length. The patient was given strict return precautions who verbalized understanding of the instructions. No further questions at time of discharge.    ED Discharge Orders    None       Follow Up: Juluis Rainier, MD [address] [place] [address] [phone]  Schedule an appointment as soon as possible for a visit  As needed     This chart was dictated using voice recognition software.  Despite best efforts to  proofread,  errors can occur which can change the documentation meaning.   Nira Conn, MD 06/23/18 [phone]

## 2018-07-08 ENCOUNTER — Emergency Department (HOSPITAL_BASED_OUTPATIENT_CLINIC_OR_DEPARTMENT_OTHER)
Admission: EM | Admit: 2018-07-08 | Discharge: 2018-07-08 | Disposition: A | Discharge status: Home / Self Care | Payer: Managed Care, Other (non HMO) | Attending: Emergency Medicine | Admitting: Emergency Medicine

## 2018-07-08 ENCOUNTER — Encounter (HOSPITAL_BASED_OUTPATIENT_CLINIC_OR_DEPARTMENT_OTHER): Payer: Self-pay

## 2018-07-08 ENCOUNTER — Other Ambulatory Visit: Payer: Self-pay

## 2018-07-08 DIAGNOSIS — R1084 Generalized abdominal pain: Secondary | ICD-10-CM | POA: Insufficient documentation

## 2018-07-08 DIAGNOSIS — Z79899 Other long term (current) drug therapy: Secondary | ICD-10-CM | POA: Diagnosis not present

## 2018-07-08 DIAGNOSIS — R509 Fever, unspecified: Secondary | ICD-10-CM | POA: Insufficient documentation

## 2018-07-08 DIAGNOSIS — F5089 Other specified eating disorder: Secondary | ICD-10-CM | POA: Diagnosis not present

## 2018-07-08 DIAGNOSIS — R197 Diarrhea, unspecified: Secondary | ICD-10-CM | POA: Diagnosis not present

## 2018-07-08 DIAGNOSIS — R112 Nausea with vomiting, unspecified: Secondary | ICD-10-CM | POA: Diagnosis present

## 2018-07-08 LAB — COMPREHENSIVE METABOLIC PANEL
ALT: 17 U/L (ref 0–44)
AST: 15 U/L (ref 15–41)
Albumin: 3.7 g/dL (ref 3.5–5.0)
Alkaline Phosphatase: 83 U/L (ref 38–126)
Anion gap: 9 (ref 5–15)
BUN: 11 mg/dL (ref 6–20)
CO2: 26 mmol/L (ref 22–32)
Calcium: 9.1 mg/dL (ref 8.9–10.3)
Chloride: 103 mmol/L (ref 98–111)
Creatinine, Ser: 0.9 mg/dL (ref 0.44–1.00)
GFR calc Af Amer: >60 mL/min (ref >60)
GFR calc non Af Amer: >60 mL/min (ref >60)
Glucose, Bld: 94 mg/dL (ref 70–99)
Potassium: 3.5 mmol/L (ref 3.5–5.1)
Sodium: 138 mmol/L (ref 135–145)
Total Bilirubin: 0.5 mg/dL (ref 0.3–1.2)
Total Protein: 7.5 g/dL (ref 6.5–8.1)

## 2018-07-08 LAB — LIPASE, BLOOD: Lipase: 19 U/L (ref 11–51)

## 2018-07-08 LAB — CBC
HCT: 35.4 % — ABNORMAL LOW (ref 36.0–46.0)
Hemoglobin: 12.4 g/dL (ref 12.0–15.0)
MCH: 27.9 pg (ref 26.0–34.0)
MCHC: 35 g/dL (ref 30.0–36.0)
MCV: 79.6 fL (ref 78.0–100.0)
Platelets: 374 10*3/uL (ref 150–400)
RBC: 4.45 MIL/uL (ref 3.87–5.11)
RDW: 14.8 % (ref 11.5–15.5)
WBC: 7.7 10*3/uL (ref 4.0–10.5)

## 2018-07-08 LAB — URINALYSIS, ROUTINE W REFLEX MICROSCOPIC
Bilirubin Urine: NEGATIVE
Glucose, UA: NEGATIVE mg/dL
Hgb urine dipstick: NEGATIVE
Ketones, ur: NEGATIVE mg/dL
Leukocytes, UA: NEGATIVE
Nitrite: NEGATIVE
Protein, ur: NEGATIVE mg/dL
Specific Gravity, Urine: 1.025 (ref 1.005–1.030)
pH: 5.5 (ref 5.0–8.0)

## 2018-07-08 LAB — PREGNANCY, URINE: Preg Test, Ur: NEGATIVE

## 2018-07-08 MED ORDER — ONDANSETRON HCL 4 MG PO TABS: 4.0000 mg | ORAL_TABLET | Freq: Two times a day (BID) | ORAL | 0 refills | Status: AC

## 2018-07-08 MED ORDER — ONDANSETRON HCL 4 MG/2ML IJ SOLN
4.0000 mg | Freq: Once | INTRAMUSCULAR | Status: AC | PRN
  Administered 2018-07-08: 4 mg via INTRAVENOUS
  Filled 2018-07-08: qty 2

## 2018-07-08 MED ORDER — ACETAMINOPHEN 325 MG PO TABS
650.0000 mg | ORAL_TABLET | Freq: Once | ORAL | Status: AC
  Administered 2018-07-08: 650 mg via ORAL
  Filled 2018-07-08: qty 2

## 2018-07-08 MED ORDER — SODIUM CHLORIDE 0.9 % IV BOLUS
1000.0000 mL | Freq: Once | INTRAVENOUS | Status: AC
  Administered 2018-07-08: 1000 mL via INTRAVENOUS

## 2018-07-08 NOTE — Discharge Instructions (Addendum)
Have prescribed medication for your nausea please take as directed and as needed.  You may continue to drink fluids and Gatorade to stay hydrated.  If your symptoms worsen return to the ED for reevaluation.

## 2018-07-08 NOTE — ED Provider Notes (Signed)
[facility] EMERGENCY DEPARTMENT Provider Note   CSN: [id number] Arrival date & time: 07/08/18  1911     History   Chief Complaint Chief Complaint  Patient presents with  . Emesis    HPI Barbara Moore is a 27 y.o. female.  27 y/o female with no PMH presents to the ED with a chief complaint of emesis and diarrhea since last night.  Patient states that she works her shift for [employer] when she had a couple of old coffee around 1 AM she began throwing up and having multiple bowel movements.  Patient reports that she had another episode of emesis this morning at 9 AM and has been unable to keep any liquids or solids down.  Patient also reports that she has had multiple episodes of diarrhea 3-4 times but reports there is no blood in her stool.  She also reports left-sided abdominal pain around the lower region states "feels of an upset stomach ".  Patient has taken antidiarrheal medication along with nausea medication which have seemed to help improve her symptoms.  She denies any shortness of breath, gynecological issues, urinary complaints or chest pain.     Past Medical History:  Diagnosis Date  . Obesity   . Seasonal allergies     Patient Active Problem List   Diagnosis Date Noted  . Flank pain 02/22/2014  . Abdominal pain 02/22/2014    History reviewed. No pertinent surgical history.   OB History   None      Home Medications    Prior to Admission medications   Medication Sig Start Date End Date Taking? Authorizing Provider  benzonatate (TESSALON) 100 MG capsule Take 1 capsule (100 mg total) by mouth every 8 (eight) hours. 03/27/18   Cathie Hoops, Amy V, PA-C  fluticasone (FLONASE) 50 MCG/ACT nasal spray Place 2 sprays into both nostrils daily. 03/27/18   Cathie Hoops, Amy V, PA-C  ipratropium (ATROVENT) 0.06 % nasal spray Place 2 sprays into both nostrils 4 (four) times daily. 03/27/18   Cathie Hoops, Amy V, PA-C  ondansetron (ZOFRAN) 4 MG tablet Take 1 tablet (4 mg total) by mouth 2  (two) times daily for 7 days. 07/08/18 07/15/18  Claude Manges, PA-C    Family History No family history on file.  Social History Social History   Tobacco Use  . Smoking status: Never Smoker  . Smokeless tobacco: Never Used  Substance Use Topics  . Alcohol use: Yes    Comment: occ  . Drug use: No     Allergies   Lactose intolerance (gi)   Review of Systems Review of Systems  Constitutional: Positive for fever.  HENT: Negative for sore throat.   Respiratory: Negative for shortness of breath.   Cardiovascular: Negative for chest pain.  Gastrointestinal: Positive for abdominal pain, diarrhea, nausea and vomiting. Negative for blood in stool.  Genitourinary: Negative for dysuria, flank pain and hematuria.  Musculoskeletal: Negative for back pain.  Skin: Negative for pallor and wound.  Neurological: Negative for light-headedness and headaches.  All other systems reviewed and are negative.    Physical Exam Updated Vital Signs BP 128/67   Pulse 95   Temp (!) 100.5 F (38.1 C) (Oral)   Resp 16   Ht 5\' 7"  (1.702 m)   Wt (!) 141.1 kg   LMP 06/25/2018   SpO2 98%   BMI 48.71 kg/m   Physical Exam  Constitutional: She is oriented to person, place, and time. She appears well-developed and well-nourished.  HENT:  Head:  Normocephalic and atraumatic.  Neck: Normal range of motion. Neck supple.  Cardiovascular: Normal heart sounds.  Pulmonary/Chest: Breath sounds normal.  Abdominal: Soft. Bowel sounds are normal. There is generalized tenderness. There is no rigidity, no CVA tenderness and negative Murphy's sign.  Generalized tenderness throughout abdomen, no focal point of tenderness.  Neurological: She is alert and oriented to person, place, and time.  Skin: Skin is dry.  Nursing note and vitals reviewed.    ED Treatments / Results  Labs (all labs ordered are listed, but only abnormal results are displayed) Labs Reviewed  CBC - Abnormal; Notable for the following  components:      Result Value   HCT 35.4 (*)    All other components within normal limits  URINALYSIS, ROUTINE W REFLEX MICROSCOPIC  PREGNANCY, URINE  LIPASE, BLOOD  COMPREHENSIVE METABOLIC PANEL    EKG None  Radiology No results found.  Procedures Procedures (including critical care time)  Medications Ordered in ED Medications  ondansetron (ZOFRAN) injection 4 mg (4 mg Intravenous Given 07/08/18 2122)  acetaminophen (TYLENOL) tablet 650 mg (650 mg Oral Given 07/08/18 2122)  sodium chloride 0.9 % bolus 1,000 mL (0 mLs Intravenous Stopped 07/08/18 2259)     Initial Impression / Assessment and Plan / ED Course  I have reviewed the triage vital signs and the nursing notes.  Pertinent labs & imaging results that were available during my care of the patient were reviewed by me and considered in my medical decision making (see chart for details).     Patient presents with an episode of emesis which began this morning when she had a cup of coffee as she reports.  She also had multiple episodes of diarrhea there is no blood in her stool or emesis.  Patient's work-up in the ED has been benign so far CMP showed no electrolyte abnormality AST and ALT are normal limits, I believe any other pathology is less likely at this point.  CBC showed no leukocytosis patient is afebrile and showing no signs of current infection at this time.  Patient's temperature has been elevated during ED visit and now has come down to 100.5 patient will receive p.o. challenge if tolerated we discharged home with symptomatic treatment.  11:14 PM tolerating p.o. challenge and has been able to keep fluids down, she has received a bolus of IV fluids and states she feels much better.  I will discharge patient home with Zofran for her nausea.  Have advised patient she may continue to take Tylenol as she does have a thermometer at home to help with her fever that she is likely suffering from a bacterial infection from the  food.  Patient understands and agrees with plan.  Return precautions have been discussed at length with patient.  Patient currently slightly tachycardic and has a fever of 100.5 but states she feels much better than when she first came in.  Patient is advised to return if anything worsens. Final Clinical Impressions(s) / ED Diagnoses   Final diagnoses:  Psychogenic vomiting with nausea    ED Discharge Orders         Ordered    ondansetron (ZOFRAN) 4 MG tablet  2 times daily     07/08/18 2313           Claude Manges, PA-C 07/08/18 2315    Raeford Razor, MD 07/13/18 [id number]

## 2018-07-08 NOTE — ED Triage Notes (Signed)
C/o n/v/d, chills x today-NAD-steady gait 

## 2018-07-21 ENCOUNTER — Ambulatory Visit (HOSPITAL_COMMUNITY)
Admission: EM | Admit: 2018-07-21 | Discharge: 2018-07-21 | Disposition: A | Discharge status: Home / Self Care | Payer: Managed Care, Other (non HMO) | Attending: Family Medicine | Admitting: Family Medicine

## 2018-07-21 DIAGNOSIS — Z3202 Encounter for pregnancy test, result negative: Secondary | ICD-10-CM | POA: Diagnosis not present

## 2018-07-21 DIAGNOSIS — R310 Gross hematuria: Secondary | ICD-10-CM | POA: Diagnosis not present

## 2018-07-21 DIAGNOSIS — R300 Dysuria: Secondary | ICD-10-CM | POA: Diagnosis not present

## 2018-07-21 LAB — POCT PREGNANCY, URINE: PREG TEST UR: NEGATIVE

## 2018-07-21 LAB — POCT URINALYSIS DIP (DEVICE)
BILIRUBIN URINE: NEGATIVE
GLUCOSE, UA: NEGATIVE mg/dL
Ketones, ur: NEGATIVE mg/dL
LEUKOCYTES UA: NEGATIVE
NITRITE: NEGATIVE
Protein, ur: >=300 mg/dL — AB
Specific Gravity, Urine: 1.025 (ref 1.005–1.030)
Urobilinogen, UA: 4 mg/dL — ABNORMAL HIGH (ref 0.0–1.0)
pH: 7 (ref 5.0–8.0)

## 2018-07-21 MED ORDER — MELOXICAM 7.5 MG PO TABS: 7.5000 mg | ORAL_TABLET | Freq: Every day | ORAL | 0 refills | Status: DC

## 2018-07-21 NOTE — ED Provider Notes (Signed)
[facility]    CSN: [id number] Arrival date & time: 07/21/18  1940     History   Chief Complaint Chief Complaint  Patient presents with  . Urinary Tract Infection    HPI Barbara Moore is a 27 y.o. female.   27 year old female comes in for a 1 day history of urinary symptoms.  Has had urinary urgency, dysuria, hematuria.  Denies fever, chills, night sweats.  Has had some suprapubic pain/spasm that is worse when she has urges to urinate.  Denies nausea, vomiting.  Denies vaginal discharge, itching, pain, spotting.  LMP 06/25/2017.  Sexually active with one female partner, no condom use.  No birth control use.     Past Medical History:  Diagnosis Date  . Obesity   . Seasonal allergies     Patient Active Problem List   Diagnosis Date Noted  . Flank pain 02/22/2014  . Abdominal pain 02/22/2014    No past surgical history on file.  OB History   None      Home Medications    Prior to Admission medications   Medication Sig Start Date End Date Taking? Authorizing Provider  benzonatate (TESSALON) 100 MG capsule Take 1 capsule (100 mg total) by mouth every 8 (eight) hours. 03/27/18   Cathie Hoops, Chenell Lozon V, PA-C  fluticasone (FLONASE) 50 MCG/ACT nasal spray Place 2 sprays into both nostrils daily. 03/27/18   Cathie Hoops, Montrail Mehrer V, PA-C  ipratropium (ATROVENT) 0.06 % nasal spray Place 2 sprays into both nostrils 4 (four) times daily. 03/27/18   Cathie Hoops, Calub Tarnow V, PA-C  meloxicam (MOBIC) 7.5 MG tablet Take 1 tablet (7.5 mg total) by mouth daily. 07/21/18   Belinda Fisher, PA-C    Family History No family history on file.  Social History Social History   Tobacco Use  . Smoking status: Never Smoker  . Smokeless tobacco: Never Used  Substance Use Topics  . Alcohol use: Yes    Comment: occ  . Drug use: No     Allergies   Lactose intolerance (gi)   Review of Systems Review of Systems  Reason unable to perform ROS: See HPI as above.     Physical Exam Triage Vital Signs ED Triage Vitals   Enc Vitals Group     BP 07/21/18 1950 125/69     Pulse Rate 07/21/18 1950 92     Resp 07/21/18 1950 18     Temp 07/21/18 1950 97.8 F (36.6 C)     Temp Source 07/21/18 1950 Oral     SpO2 07/21/18 1950 100 %     Weight 07/21/18 1952 (!) 315 lb (142.9 kg)     Height --      Head Circumference --      Peak Flow --      Pain Score 07/21/18 1952 7     Pain Loc --      Pain Edu? --      Excl. in GC? --    No data found.  Updated Vital Signs BP 125/69   Pulse 92   Temp 97.8 F (36.6 C) (Oral)   Resp 18   Wt (!) 315 lb (142.9 kg)   LMP 06/25/2018   SpO2 100%   BMI 49.34 kg/m   Physical Exam  Constitutional: She is oriented to person, place, and time. She appears well-developed and well-nourished. No distress.  HENT:  Head: Normocephalic and atraumatic.  Eyes: Pupils are equal, round, and reactive to light. Conjunctivae are normal.  Cardiovascular: Normal  rate, regular rhythm and normal heart sounds. Exam reveals no gallop and no friction rub.  No murmur heard. Pulmonary/Chest: Effort normal and breath sounds normal. She has no wheezes. She has no rales.  Abdominal: Soft. Bowel sounds are normal. She exhibits no mass. There is no tenderness. There is no rebound, no guarding and no CVA tenderness.  Genitourinary: Vagina normal. Cervix exhibits no discharge.  Neurological: She is alert and oriented to person, place, and time.  Skin: Skin is warm and dry.  Psychiatric: She has a normal mood and affect. Her behavior is normal. Judgment normal.     UC Treatments / Results  Labs (all labs ordered are listed, but only abnormal results are displayed) Labs Reviewed  POCT URINALYSIS DIP (DEVICE) - Abnormal; Notable for the following components:      Result Value   Hgb urine dipstick LARGE (*)    Protein, ur >=300 (*)    Urobilinogen, UA 4.0 (*)    All other components within normal limits  POCT PREGNANCY, URINE    EKG None  Radiology No results  found.  Procedures Procedures (including critical care time)  Medications Ordered in UC Medications - No data to display  Initial Impression / Assessment and Plan / UC Course  I have reviewed the triage vital signs and the nursing notes.  Pertinent labs & imaging results that were available during my care of the patient were reviewed by me and considered in my medical decision making (see chart for details).    Gross hematuria without infection. Vaginal canal without blood. Patient denies injury. Discussed possible kidney stone. mobic as needed for pain. Push fluids. Return precautions given. Patient expresses understanding and agrees to plan.  Final Clinical Impressions(s) / UC Diagnoses   Final diagnoses:  Gross hematuria    ED Prescriptions    Medication Sig Dispense Auth. Provider   meloxicam (MOBIC) 7.5 MG tablet Take 1 tablet (7.5 mg total) by mouth daily. 15 tablet Threasa Alpha, [place] 07/21/18 2039

## 2018-07-21 NOTE — Discharge Instructions (Signed)
Urine negative for infection.  Vaginal canal without blood.  As discussed, this could be caused by kidney stones. Keep hydrated, your urine should be clear to pale yellow in color.  Mobic as needed for pain.  If experiencing worsening symptoms, abdominal pain, nausea, vomiting, unable to walk/jump due to pain, go to the emergency department for further evaluation.

## 2018-07-21 NOTE — ED Triage Notes (Signed)
Pt states she has a UTI . This started today

## 2018-07-24 ENCOUNTER — Telehealth (HOSPITAL_COMMUNITY): Payer: Self-pay | Admitting: Emergency Medicine

## 2018-07-24 MED ORDER — NITROFURANTOIN MONOHYD MACRO 100 MG PO CAPS: 100.0000 mg | ORAL_CAPSULE | Freq: Two times a day (BID) | ORAL | 0 refills | Status: DC

## 2018-07-24 NOTE — Telephone Encounter (Signed)
Pt called stating she was still having urinary symptoms, bladder spasms, and frequent urination. Pt requesting something to help her with that. Spoke with Dr. Delton See, will send 3 days of macrobid and if pt is not feeling any better to return here for reevaluation. PT agreeable to plan.

## 2018-07-25 ENCOUNTER — Telehealth (HOSPITAL_COMMUNITY): Payer: Self-pay

## 2018-07-25 MED ORDER — NITROFURANTOIN MONOHYD MACRO 100 MG PO CAPS: 100.0000 mg | ORAL_CAPSULE | Freq: Two times a day (BID) | ORAL | 0 refills | Status: DC

## 2018-08-22 ENCOUNTER — Ambulatory Visit (HOSPITAL_COMMUNITY)
Admission: EM | Admit: 2018-08-22 | Discharge: 2018-08-22 | Disposition: A | Discharge status: Home / Self Care | Payer: 59 | Attending: Family Medicine | Admitting: Family Medicine

## 2018-08-22 ENCOUNTER — Encounter (HOSPITAL_COMMUNITY): Payer: Self-pay

## 2018-08-22 DIAGNOSIS — E669 Obesity, unspecified: Secondary | ICD-10-CM | POA: Insufficient documentation

## 2018-08-22 DIAGNOSIS — Z833 Family history of diabetes mellitus: Secondary | ICD-10-CM | POA: Insufficient documentation

## 2018-08-22 DIAGNOSIS — R110 Nausea: Secondary | ICD-10-CM | POA: Diagnosis not present

## 2018-08-22 DIAGNOSIS — R350 Frequency of micturition: Secondary | ICD-10-CM

## 2018-08-22 DIAGNOSIS — E739 Lactose intolerance, unspecified: Secondary | ICD-10-CM | POA: Diagnosis not present

## 2018-08-22 DIAGNOSIS — R300 Dysuria: Secondary | ICD-10-CM | POA: Diagnosis not present

## 2018-08-22 LAB — POCT URINALYSIS DIP (DEVICE)
BILIRUBIN URINE: NEGATIVE
Glucose, UA: NEGATIVE mg/dL
Ketones, ur: NEGATIVE mg/dL
NITRITE: POSITIVE — AB
PH: 5.5 (ref 5.0–8.0)
Protein, ur: 100 mg/dL — AB
Specific Gravity, Urine: 1.025 (ref 1.005–1.030)
Urobilinogen, UA: 0.2 mg/dL (ref 0.0–1.0)

## 2018-08-22 MED ORDER — NITROFURANTOIN MONOHYD MACRO 100 MG PO CAPS: 100.0000 mg | ORAL_CAPSULE | Freq: Two times a day (BID) | ORAL | 0 refills | Status: AC

## 2018-08-22 NOTE — Discharge Instructions (Signed)
Urine showed evidence of infection. We are treating you with macrobid- twice daily for 5 days. Be sure to take full course. Stay hydrated- urine should be pale yellow to clear. My continue azo for relief of burning while infection is being cleared.   We are testing you for Gonorrhea, Chlamydia, Trichomonas, Yeast and Bacterial Vaginosis. We will call you if anything is positive and let you know if you require any further treatment. Please inform partners of any positive results.   Please return if symptoms not improving with treatment, development of fever, nausea, vomiting, abdominal pain.   Please return or follow up with your primary provider if symptoms not improving with treatment. Please return sooner if you have worsening of symptoms or develop fever, nausea, vomiting, abdominal pain, back pain, lightheadedness, dizziness.

## 2018-08-22 NOTE — ED Provider Notes (Signed)
[facility]    CSN: [id number] Arrival date & time: 08/22/18  1108     History   Chief Complaint Chief Complaint  Patient presents with  . Dysuria    HPI Barbara Moore is a 27 y.o. female no significant past medical history presenting today for evaluation of urinary frequency, left lower abdominal discomfort as well as discomfort with urination.  Patient states that she has had a squeezing sensation where she urinates from, denies burning sensation.  Denies abnormal discharge, itching or irritation.  Denies abnormal bleeding.  Last menstrual period was 10/27.  Patient was not on any form of birth control.  Having some mild nausea.  Concerned that her partner may have a UTI as well as he has had similar symptoms.  HPI  Past Medical History:  Diagnosis Date  . Obesity   . Seasonal allergies     Patient Active Problem List   Diagnosis Date Noted  . Flank pain 02/22/2014  . Abdominal pain 02/22/2014    History reviewed. No pertinent surgical history.  OB History   None      Home Medications    Prior to Admission medications   Medication Sig Start Date End Date Taking? Authorizing Provider  nitrofurantoin, macrocrystal-monohydrate, (MACROBID) 100 MG capsule Take 1 capsule (100 mg total) by mouth 2 (two) times daily for 5 days. 08/22/18 08/27/18  Jerrol Helmers, Junius Creamer, PA-C    Family History Family History  Problem Relation Age of Onset  . Diabetes Mother   . Heart failure Mother   . Healthy Father     Social History Social History   Tobacco Use  . Smoking status: Never Smoker  . Smokeless tobacco: Never Used  Substance Use Topics  . Alcohol use: Yes    Comment: occ  . Drug use: No     Allergies   Lactose intolerance (gi)   Review of Systems Review of Systems  Constitutional: Negative for fever.  Respiratory: Negative for shortness of breath.   Cardiovascular: Negative for chest pain.  Gastrointestinal: Positive for abdominal pain and  nausea. Negative for diarrhea and vomiting.  Genitourinary: Positive for dysuria and frequency. Negative for flank pain, genital sores, hematuria, menstrual problem, vaginal bleeding, vaginal discharge and vaginal pain.  Musculoskeletal: Negative for back pain.  Skin: Negative for rash.  Neurological: Negative for dizziness, light-headedness and headaches.     Physical Exam Triage Vital Signs ED Triage Vitals [08/22/18 1230]  Enc Vitals Group     BP      Pulse      Resp      Temp      Temp src      SpO2      Weight      Height      Head Circumference      Peak Flow      Pain Score 5     Pain Loc      Pain Edu?      Excl. in GC?    No data found.  Updated Vital Signs LMP 08/02/2018   Visual Acuity Right Eye Distance:   Left Eye Distance:   Bilateral Distance:    Right Eye Near:   Left Eye Near:    Bilateral Near:     Physical Exam  Constitutional: She appears well-developed and well-nourished. No distress.  HENT:  Head: Normocephalic and atraumatic.  Eyes: Conjunctivae are normal.  Neck: Neck supple.  Cardiovascular: Normal rate and regular rhythm.  No murmur heard.  Pulmonary/Chest: Effort normal and breath sounds normal. No respiratory distress.  Abdominal: Soft. There is tenderness.  Left lower abdominal discomfort, negative rebound, increased tenderness towards suprapubic area  No CVA tenderness  Genitourinary:  Genitourinary Comments: Deferred  Musculoskeletal: She exhibits no edema.  Neurological: She is alert.  Skin: Skin is warm and dry.  Psychiatric: She has a normal mood and affect.  Nursing note and vitals reviewed.    UC Treatments / Results  Labs (all labs ordered are listed, but only abnormal results are displayed) Labs Reviewed  POCT URINALYSIS DIP (DEVICE) - Abnormal; Notable for the following components:      Result Value   Hgb urine dipstick MODERATE (*)    Protein, ur 100 (*)    Nitrite POSITIVE (*)    Leukocytes, UA SMALL (*)     All other components within normal limits  URINE CULTURE  CERVICOVAGINAL ANCILLARY ONLY    EKG None  Radiology No results found.  Procedures Procedures (including critical care time)  Medications Ordered in UC Medications - No data to display  Initial Impression / Assessment and Plan / UC Course  I have reviewed the triage vital signs and the nursing notes.  Pertinent labs & imaging results that were available during my care of the patient were reviewed by me and considered in my medical decision making (see chart for details).     Positive nitrites, small leuks.  Will treat with Macrobid for UTI.  Discussed with patient that if partner is having similar symptoms I would recommend getting tested for STDs as well.  Advised that her partner should be evaluated.  Will send urine off for culture, the cervical vaginal swab obtained.  Will call patient with results and alter treatment if needed.Discussed strict return precautions. Patient verbalized understanding and is agreeable with plan.  Final Clinical Impressions(s) / UC Diagnoses   Final diagnoses:  Urinary frequency  Dysuria     Discharge Instructions     Urine showed evidence of infection. We are treating you with macrobid- twice daily for 5 days. Be sure to take full course. Stay hydrated- urine should be pale yellow to clear. My continue azo for relief of burning while infection is being cleared.   We are testing you for Gonorrhea, Chlamydia, Trichomonas, Yeast and Bacterial Vaginosis. We will call you if anything is positive and let you know if you require any further treatment. Please inform partners of any positive results.   Please return if symptoms not improving with treatment, development of fever, nausea, vomiting, abdominal pain.   Please return or follow up with your primary provider if symptoms not improving with treatment. Please return sooner if you have worsening of symptoms or develop fever, nausea,  vomiting, abdominal pain, back pain, lightheadedness, dizziness.   ED Prescriptions    Medication Sig Dispense Auth. Provider   nitrofurantoin, macrocrystal-monohydrate, (MACROBID) 100 MG capsule Take 1 capsule (100 mg total) by mouth 2 (two) times daily for 5 days. 10 capsule Mikaela Hilgeman C, PA-C     Controlled Substance Prescriptions [place] Controlled Substance Registry consulted? Not Applicable   Lew DawesWieters, Laryn Venning C, [place]-C 08/22/18 1318

## 2018-08-22 NOTE — ED Triage Notes (Signed)
Pt presents with complaints of dysuria x 2 days. Also endorses left flank pain and nausea.

## 2018-08-24 ENCOUNTER — Telehealth (HOSPITAL_COMMUNITY): Payer: Self-pay | Admitting: Emergency Medicine

## 2018-08-24 LAB — CERVICOVAGINAL ANCILLARY ONLY
BACTERIAL VAGINITIS: POSITIVE — AB
CANDIDA VAGINITIS: NEGATIVE
Chlamydia: NEGATIVE
Neisseria Gonorrhea: NEGATIVE
Trichomonas: NEGATIVE

## 2018-08-24 LAB — URINE CULTURE: Culture: >=100000 — AB

## 2018-08-24 NOTE — Telephone Encounter (Signed)
Urine culture was positive for ESCHERICHIA COLI and was given Macrobid  at urgent care visit. Attempted to reach patient. No answer at this time.

## 2018-08-25 ENCOUNTER — Telehealth (HOSPITAL_COMMUNITY): Payer: Self-pay

## 2018-08-25 MED ORDER — METRONIDAZOLE 500 MG PO TABS: 500.0000 mg | ORAL_TABLET | Freq: Two times a day (BID) | ORAL | 0 refills | Status: DC

## 2018-08-25 NOTE — Telephone Encounter (Signed)
Bacterial vaginosis is positive. This was not treated at the urgent care visit.  Flagyl 500 mg BID x 7 days #14 no refills sent to patients pharmacy of choice.  Patient contacted, denies any concerns. Answered all questions.    

## 2018-09-07 ENCOUNTER — Ambulatory Visit (INDEPENDENT_AMBULATORY_CARE_PROVIDER_SITE_OTHER): Payer: Self-pay | Admitting: *Deleted

## 2018-09-07 ENCOUNTER — Encounter: Payer: Self-pay | Admitting: Family Medicine

## 2018-09-07 DIAGNOSIS — Z3201 Encounter for pregnancy test, result positive: Secondary | ICD-10-CM

## 2018-09-07 DIAGNOSIS — Z3200 Encounter for pregnancy test, result unknown: Secondary | ICD-10-CM

## 2018-09-07 LAB — POCT PREGNANCY, URINE: PREG TEST UR: POSITIVE — AB

## 2018-09-07 NOTE — Progress Notes (Signed)
Here for pregnancy test which was positive. States LMP 08/02/18 and she is sure of that date. This makes her [id number] with EDD 05/09/19. Recommended she start prenatal care at 10 weeks and is already taking prenatal vitamins.

## 2018-09-08 NOTE — Progress Notes (Signed)
I have reviewed this chart and agree with the RN/CMA assessment and management.    Aris Moman C Mikale Silversmith, MD, FACOG Attending Physician, [facility]  

## 2018-10-07 NOTE — L&D Delivery Note (Addendum)
Delivery Note At 1:46 PM a viable female was delivered via Vaginal, Spontaneous (Presentation: Vertex; ROA).  APGAR: 8, 9; weight see delivery summary.   Placenta status: Spontaneous, intact.  Cord: 3 vessels with the following complications: nuchal x1, reduced at perineum.  Anesthesia: Epidural   Episiotomy: None Lacerations: Labial;Perineal 1st degree Suture Repair: 3.0 vicryl rapide x 2 Est. Blood Loss (mL): 1098 PPH protocol followed: Pitocin given following delivery. Uterine massage performed and uterus displayed adequate tone. Lacerations were sutured and were hemostatic. Multiple clots extracted from the vaginal vault. TXA was given. 800 mcg rectal cytotec given. Patient became hemostatic shortly after cytotec.   Mom to postpartum.  Baby to Couplet care / Skin to Skin.  Gerlene Fee, DO Family Medicine Resident, PGY-1 05/02/2019, 3:13 PM  OB FELLOW DELIVERY ATTESTATION  I was gloved and present for the delivery in its entirety, and I agree with the above resident's note.    Phill Myron, D.O. OB Fellow  05/02/2019, 6:17 PM

## 2018-10-23 ENCOUNTER — Encounter: Payer: Self-pay | Admitting: *Deleted

## 2018-10-23 ENCOUNTER — Ambulatory Visit (INDEPENDENT_AMBULATORY_CARE_PROVIDER_SITE_OTHER): Payer: BLUE CROSS/BLUE SHIELD | Admitting: *Deleted

## 2018-10-23 ENCOUNTER — Ambulatory Visit: Payer: Self-pay

## 2018-10-23 VITALS — BP 122/68 | HR 92 | Wt 304.5 lb

## 2018-10-23 DIAGNOSIS — Z113 Encounter for screening for infections with a predominantly sexual mode of transmission: Secondary | ICD-10-CM | POA: Diagnosis not present

## 2018-10-23 DIAGNOSIS — Z3400 Encounter for supervision of normal first pregnancy, unspecified trimester: Secondary | ICD-10-CM | POA: Diagnosis not present

## 2018-10-23 DIAGNOSIS — B009 Herpesviral infection, unspecified: Secondary | ICD-10-CM | POA: Insufficient documentation

## 2018-10-23 DIAGNOSIS — Z23 Encounter for immunization: Secondary | ICD-10-CM

## 2018-10-23 DIAGNOSIS — O3680X0 Pregnancy with inconclusive fetal viability, not applicable or unspecified: Secondary | ICD-10-CM

## 2018-10-23 DIAGNOSIS — Z8619 Personal history of other infectious and parasitic diseases: Secondary | ICD-10-CM

## 2018-10-23 MED ORDER — PRENATAL VITAMINS 0.8 MG PO TABS: 1.0000 | ORAL_TABLET | Freq: Every day | ORAL | 12 refills | Status: DC

## 2018-10-23 NOTE — Progress Notes (Signed)
New Ob intake completed and pregnancy information packet given. Ob Ltd ultrasound performed due to unable to obtain FHR with doppler. Viable pregnancy confirmed and CRL is consistent with LMP dating. Labs drawn. Pt states she is Sickle cell trait positive - lab confirmation is not on file - will check today. She reports having one outbreak of genital Herpes in 2017 - none since. Pt will check with her BC/BS insurance to see if Cindy Hazyanorama is covered and will discuss with provider @ next visit. She had no complaints or problems.

## 2018-10-25 LAB — CULTURE, OB URINE

## 2018-10-25 LAB — URINE CULTURE, OB REFLEX

## 2018-10-26 LAB — GC/CHLAMYDIA PROBE AMP (~~LOC~~) NOT AT ARMC
Chlamydia: NEGATIVE
Neisseria Gonorrhea: NEGATIVE

## 2018-10-26 NOTE — Progress Notes (Signed)
I have reviewed the chart and agree with nursing staff's documentation of this patient's encounter.  Geneva Bing, MD 10/26/2018 8:53 AM

## 2018-10-28 ENCOUNTER — Ambulatory Visit (INDEPENDENT_AMBULATORY_CARE_PROVIDER_SITE_OTHER): Payer: BLUE CROSS/BLUE SHIELD | Admitting: Nurse Practitioner

## 2018-10-28 VITALS — BP 120/68 | HR 102 | Wt 303.0 lb

## 2018-10-28 DIAGNOSIS — Z1151 Encounter for screening for human papillomavirus (HPV): Secondary | ICD-10-CM

## 2018-10-28 DIAGNOSIS — Z113 Encounter for screening for infections with a predominantly sexual mode of transmission: Secondary | ICD-10-CM

## 2018-10-28 DIAGNOSIS — N760 Acute vaginitis: Secondary | ICD-10-CM

## 2018-10-28 DIAGNOSIS — Z3402 Encounter for supervision of normal first pregnancy, second trimester: Secondary | ICD-10-CM

## 2018-10-28 DIAGNOSIS — Z124 Encounter for screening for malignant neoplasm of cervix: Secondary | ICD-10-CM | POA: Diagnosis not present

## 2018-10-28 DIAGNOSIS — Z8619 Personal history of other infectious and parasitic diseases: Secondary | ICD-10-CM

## 2018-10-28 DIAGNOSIS — B9689 Other specified bacterial agents as the cause of diseases classified elsewhere: Secondary | ICD-10-CM | POA: Diagnosis not present

## 2018-10-28 DIAGNOSIS — Z3400 Encounter for supervision of normal first pregnancy, unspecified trimester: Secondary | ICD-10-CM

## 2018-10-28 DIAGNOSIS — Z6841 Body Mass Index (BMI) 40.0 and over, adult: Secondary | ICD-10-CM | POA: Insufficient documentation

## 2018-10-28 DIAGNOSIS — N898 Other specified noninflammatory disorders of vagina: Secondary | ICD-10-CM | POA: Diagnosis not present

## 2018-10-28 DIAGNOSIS — O99213 Obesity complicating pregnancy, third trimester: Secondary | ICD-10-CM | POA: Insufficient documentation

## 2018-10-28 MED ORDER — PNV-DHA 27-0.6-0.4-300 MG PO CAPS: 1.0000 | ORAL_CAPSULE | Freq: Every day | ORAL | 11 refills | Status: AC

## 2018-10-28 NOTE — Patient Instructions (Addendum)
Drink 64 ounces of water every day. Weight gain of 11-20 pounds this pregnancy.  Childbirth Education Options: [facility] Department Classes:  Childbirth education classes can help you get ready for a positive parenting experience. You can also meet other expectant parents and get free stuff for your baby. Each class runs for five weeks on the same night and costs $45 for the mother-to-be and her support person. Medicaid covers the cost if you are eligible. Call [phone] to register. [facility] Childbirth Education:  [phone] or [phone] or [email or web address]  Baby & Me Class: Discuss newborn & infant parenting and family adjustment issues with other new mothers in a relaxed environment. Each week brings a new speaker or baby-centered activity. We encourage new mothers to join [place] every Thursday at 11:00am. Babies birth until crawling. No registration or fee. Daddy [employer]: This course offers Dads-to-be the tools and knowledge needed to feel confident on their journey to becoming new fathers. Experienced dads, who have been trained as coaches, teach dads-to-be how to hold, comfort, diaper, swaddle and play with their infant while being able to support the new mom as well. A class for men taught by men. $25/dad Big Brother/Big Sister: Let your children share in the joy of a new brother or sister in this special class designed just for them. Class includes discussion about how families care for babies: swaddling, holding, diapering, safety as well as how they can be helpful in their new role. This class is designed for children ages 70 to 50, but any age is welcome. Please register each child individually. $5/child  Mom Talk: This mom-led group offers support and connection to mothers as they journey through the adjustments and struggles of that sometimes overwhelming first year after the birth of a child. Tuesdays at 10:00am and Thursdays at 6:00pm. Babies welcome. No  registration or fee. Breastfeeding Support Group: This group is a mother-to-mother support circle where moms have the opportunity to share their breastfeeding experiences. A Lactation Consultant is present for questions and concerns. Meets each Tuesday at 11:00am. No fee or registration. Breastfeeding Your Baby: Learn what to expect in the first days of breastfeeding your newborn.  This class will help you feel more confident with the skills needed to begin your breastfeeding experience. Many new mothers are concerned about breastfeeding after leaving the hospital. This class will also address the most common fears and challenges about breastfeeding during the first few weeks, months and beyond. (call for fee) Comfort Techniques and Tour: This 2 hour interactive class will provide you the opportunity to learn & practice hands-on techniques that can help relieve some of the discomfort of labor and encourage your baby to rotate toward the best position for birth. You and your partner will be able to try a variety of labor positions with birth balls and rebozos as well as practice breathing, relaxation, and visualization techniques. A tour of the [facility] is included with this class. $20 per registrant and support person Childbirth Class- Weekend Option: This class is a Weekend version of our Birth & Baby series. It is designed for parents who have a difficult time fitting several weeks of classes into their schedule. It covers the care of your newborn and the basics of labor and childbirth. It also includes a [place] [facility] and lunch. The class is held two consecutive days: beginning on Friday evening from 6:30 - 8:30 p.m. and the next day, Saturday from 9  a.m. - 4 p.m. (call for fee) Waterbirth Class: Interested in a waterbirth?  This informational class will help you discover whether waterbirth is the right fit for you. Education about waterbirth  itself, supplies you would need and how to assemble your support team is what you can expect from this class. Some obstetrical practices require this class in order to pursue a waterbirth. (Not all obstetrical practices offer waterbirth-check with your healthcare provider.) Register only the expectant mom, but you are encouraged to bring your partner to class! Required if planning waterbirth, no fee. Infant/Child CPR: Parents, grandparents, babysitters, and friends learn Cardio-Pulmonary Resuscitation skills for infants and children. You will also learn how to treat both conscious and unconscious choking in infants and children. This Family & Friends program does not offer certification. Register each participant individually to ensure that enough mannequins are available. (Call for fee) Grandparent Love: Expecting a grandbaby? This class is for you! Learn about the latest infant care and safety recommendations and ways to support your own child as he or she transitions into the parenting role. Taught by Registered Nurses who are childbirth instructors, but most importantly...they are grandmothers too! $10/person. Childbirth Class- Natural Childbirth: This series of 5 weekly classes is for expectant parents who want to learn and practice natural methods of coping with the process of labor and childbirth. Relaxation, breathing, massage, visualization, role of the partner, and helpful positioning are highlighted. Participants learn how to be confident in their body's ability to give birth. This class will empower and help parents make informed decisions about their own care. Includes discussion that will help new parents transition into the immediate postpartum period. [facility] is included. We suggest taking this class between 25-32 weeks, but it's only a recommendation. $75 per registrant and one support person or $30 Medicaid. Childbirth Class- 3 week Series: This option of  3 weekly classes helps you and your labor partner prepare for childbirth. Newborn care, labor & birth, cesarean birth, pain management, and comfort techniques are discussed and a [place] [facility] is included. The class meets at the same time, on the same day of the week for 3 consecutive weeks beginning with the starting date you choose. $60 for registrant and one support person.  Marvelous Multiples: Expecting twins, triplets, or more? This class covers the differences in labor, birth, parenting, and breastfeeding issues that face multiples' parents. NICU tour is included. Led by a Certified Childbirth Educator who is the mother of twins. No fee. Caring for Baby: This class is for expectant and adoptive parents who want to learn and practice the most up-to-date newborn care for their babies. Focus is on birth through the first six weeks of life. Topics include feeding, bathing, diapering, crying, umbilical cord care, circumcision care and safe sleep. Parents learn to recognize symptoms of illness and when to call the pediatrician. Register only the mom-to-be and your partner or support person can plan to come with you! $10 per registrant and support person Childbirth Class- online option: This online class offers you the freedom to complete a Birth and Baby series in the comfort of your own home. The flexibility of this option allows you to review sections at your own pace, at times convenient to you and your support people. It includes additional video information, animations, quizzes, and extended activities. Get organized with helpful eClass tools, checklists, and trackers. Once you register online for the class, you will receive an email  within a few days to accept the invitation and begin the class when the time is right for you. The content will be available to you for 60 days. $60 for 60 days of online access for you and your support people.  Local Doulas: Natural Baby  Doulas [email or web address] Tel: [phone] [email or web address] [employer] [phone] [email or web address] The Labor Hassell Halim  (also do waterbirth tub rental) [phone] [email or web address] Triad Birth Doula ([phone] [email or web address].fi Sacred Rhythms  [phone] [email or web address] [employer] (PADA) [email or web address] [place] Birth and Baby  [email or web address] Considering Waterbirth? Guide for patients at Center for [employer]  Why consider waterbirth?  . Gentle birth for babies . Less pain medicine used in labor . May allow for passive descent/less pushing . May reduce perineal tears  . More mobility and instinctive maternal position changes . Increased maternal relaxation . Reduced blood pressure in labor  Is waterbirth safe? What are the risks of infection, drowning or other complications?  . Infection: o Very low risk (3.7 % for tub vs 4.8% for bed) o 7 in 8000 waterbirths with documented infection o Poorly cleaned equipment most common cause o Slightly lower group B strep transmission rate  . Drowning o Maternal:  - Very low risk   - Related to seizures or fainting o Newborn:  - Very low risk. No evidence of increased risk of respiratory problems in multiple large studies - Physiological protection from breathing under water - Avoid underwater birth if there are any fetal complications - Once baby's head is out of the water, keep it out.  . Birth complication o Some reports of cord trauma, but risk decreased by bringing baby to surface gradually o No evidence of increased risk of shoulder dystocia. Mothers can usually change positions faster in water than in a bed, possibly aiding the maneuvers to free the shoulder.   You  must attend a Doren Custard class at [facility]  3rd Wednesday of every month from 7-9pm  [employer] by calling [phone] or online at [email or web address]  Bring [place] the certificate from the class to your prenatal appointment  Meet with a midwife at 36 weeks to see if you can still plan a waterbirth and to sign the consent.   Purchase or rent the following supplies:   Water Birth Pool (Birth Pool in a Box or [place] for instance)  (Tubs start ~$125)  Single-use disposable tub liner designed for your brand of tub  New garden hose labeled "lead-free", "suitable for drinking water",  Electric drain pump to remove water (We recommend 792 gallon per hour or greater pump.)   Separate garden hose to remove the dirty water  Fish net  Bathing suit top (optional)  Long-handled mirror (optional)  Places to purchase or rent supplies  GotWebTools.is for tub purchases and supplies  Waterbirthsolutions.com for tub purchases and supplies  The Labor Ladies (www.[email or web address]) $275 for tub rental/set-up & take down/kit   [employer] (http://www.fleming.com/.htm) Information regarding doulas (labor support) who provide pool rentals  Our practice has a Birth Pool in a Box tub at the hospital that you may borrow on a first-come-first-served basis. It is your responsibility to to set up, clean and break down the tub. We cannot guarantee the availability of this tub in advance. You are responsible for bringing all accessories listed above. If you do not have all necessary supplies you cannot have a waterbirth.    Things that would prevent you from having a waterbirth:  Premature, <  37wks  Previous cesarean birth  Presence of thick meconium-stained fluid  Multiple gestation (Twins, triplets, etc.)  Uncontrolled diabetes or gestational diabetes requiring medication  Hypertension requiring medication or diagnosis of pre-eclampsia  Heavy  vaginal bleeding  Non-reassuring fetal heart rate  Active infection (MRSA, etc.). Group B Strep is NOT a contraindication for  waterbirth.  If your labor has to be induced and induction method requires continuous  monitoring of the baby's heart rate  Other risks/issues identified by your obstetrical provider  Please remember that birth is unpredictable. Under certain unforeseeable circumstances your provider may advise against giving birth in the tub. These decisions will be made on a case-by-case basis and with the safety of you and your baby as our highest priority.     Safe Medications in Pregnancy   Acne: Benzoyl Peroxide Salicylic Acid  Backache/Headache: Tylenol: 2 regular strength every 4 hours OR              2 Extra strength every 6 hours  Colds/Coughs/Allergies: Benadryl (alcohol free) 25 mg every 6 hours as needed Breath right strips Claritin Cepacol throat lozenges Chloraseptic throat spray Cold-Eeze- up to three times per day Cough drops, alcohol free Flonase  Guaifenesin Mucinex Robitussin DM (plain only, alcohol free) Saline nasal spray/drops Sudafed (pseudoephedrine) & Actifed ** use only after [redacted] weeks gestation and if you do not have high blood pressure Tylenol Vicks Vaporub Zinc lozenges Zyrtec   Constipation: Colace Ducolax suppositories Fleet enema Glycerin suppositories Metamucil Milk of magnesia Miralax Senokot Smooth move tea  Diarrhea: Kaopectate Imodium A-D  *NO pepto Bismol  Hemorrhoids: Anusol Anusol HC Preparation H Tucks  Indigestion: Tums Maalox Mylanta Zantac  Pepcid  Insomnia: Benadryl (alcohol free) '25mg'$  every 6 hours as needed Tylenol PM Unisom, no Gelcaps  Leg Cramps: Tums MagGel  Nausea/Vomiting:  Bonine Dramamine Emetrol Ginger extract Sea bands Meclizine  Nausea medication to take during pregnancy:  Unisom (doxylamine succinate 25 mg tablets) Take one tablet daily at bedtime. If symptoms  are not adequately controlled, the dose can be increased to a maximum recommended dose of two tablets daily (1/2 tablet in the morning, 1/2 tablet mid-afternoon and one at bedtime). Vitamin B6 '100mg'$  tablets. Take one tablet twice a day (up to 200 mg per day).  Skin Rashes: Aveeno products Benadryl cream or '25mg'$  every 6 hours as needed Calamine Lotion 1% cortisone cream  Yeast infection: Gyne-lotrimin 7 Monistat 7   **If taking multiple medications, please check labels to avoid duplicating the same active ingredients **take medication as directed on the label ** Do not exceed 4000 mg of tylenol in 24 hours **Do not take medications that contain aspirin or ibuprofen

## 2018-10-28 NOTE — Progress Notes (Signed)
Subjective:   Barbara Moore is a 28 y.o. G1P0 at 5073w3d by LMP being seen today for her first obstetrical visit.  Her obstetrical history is significant for obesity. Patient does intend to breast feed. Pregnancy history fully reviewed.  Patient reports no complaints.  HISTORY: OB History  Gravida Para Term Preterm AB Living  1 0 0 0 0 0  SAB TAB Ectopic Multiple Live Births  0 0 0 0 0    # Outcome Date GA Lbr Len/2nd Weight Sex Delivery Anes PTL Lv  1 Current            Past Medical History:  Diagnosis Date  . Herpes   . Lactose intolerance   . Obesity   . Seasonal allergies   . Sickle cell trait (HCC)    No past surgical history on file. Family History  Problem Relation Age of Onset  . Diabetes Mother   . Heart failure Mother   . Healthy Father   . Learning disabilities Sister    Social History   Tobacco Use  . Smoking status: Never Smoker  . Smokeless tobacco: Never Used  Substance Use Topics  . Alcohol use: Not Currently    Comment: occ  . Drug use: No   Allergies  Allergen Reactions  . Lactose Intolerance (Gi) Diarrhea and Nausea And Vomiting   Current Outpatient Medications on File Prior to Visit  Medication Sig Dispense Refill  . Prenatal Vit-Fe Fumarate-FA (PRENATAL VITAMIN PLUS LOW IRON) 27-1 MG TABS      No current facility-administered medications on file prior to visit.      Exam   Vitals:   10/28/18 1349  BP: 120/68  Pulse: (!) 102  Weight: (!) 303 lb (137.4 kg)   Fetal Heart Rate (bpm): 154  Uterus:  Fundal Height: 12 cm  Pelvic Exam: Perineum: no hemorrhoids, normal perineum   Vulva: normal external genitalia, no lesions   Vagina:  normal mucosa, normal discharge   Cervix: no lesions and normal, pap smear done.    Adnexa: normal adnexa and no mass, fullness, tenderness   Bony Pelvis: average  System: General: well-developed, well-nourished female in no acute distress   Breast:  normal appearance, no masses or tenderness   Skin: normal coloration and turgor, no rashes   Neurologic: oriented, normal, negative, normal mood   Extremities: normal strength, tone, and muscle mass, ROM of all joints is normal   HEENT extraocular movement intact and sclera clear, anicteric   Mouth/Teeth mucous membranes moist, pharynx normal without lesions and dental hygiene good   Neck supple and no masses, normal thyroid   Cardiovascular: regular rate and rhythm   Respiratory:  no respiratory distress, normal breath sounds   Abdomen: soft, non-tender; no masses,  no organomegaly     Assessment:   Pregnancy: G1P0 Patient Active Problem List   Diagnosis Date Noted  . BMI 45.0-49.9, adult (HCC) 10/28/2018  . Supervision of low-risk first pregnancy 10/23/2018  . History of herpes genitalis 10/23/2018     Plan:  1. Encounter for supervision of low-risk first pregnancy, antepartum Prenatal vitamins prescribed Client worried about iron levels - has had a history of anemia known from donating plasma. Discussed meds in pregnancy and advised taking childbirth and breastfeeding classes. panaroma done  - US MFM OB COMP + 14 WK; Future  2. BMI 45.0-49.9, adult (HCC) Advised weight gain of 11-20 pounds in this pregnancy.  - Hemoglobin A1c  3.  History of HSV -  was told once she had HSV - does not know what type.  Has not had any other outbreaks.  Will do blood test to confirm type of HSV.  Initial labs drawn at NOB intake visit. Continue prenatal vitamins. Genetic Screening discussed, NIPS: ordered. Ultrasound discussed; fetal anatomic survey: ordered. Problem list reviewed and updated. The nature of [place] [facility] with multiple MDs and other Advanced Practice Providers was explained to patient; also emphasized that residents, students are part of our team. Routine obstetric precautions reviewed. Return in about 4 weeks (around 11/25/2018).  Total face-to-face time with patient: 40 minutes.   Over 50% of encounter was spent on counseling and coordination of care.     Nolene Bernheim, FNP Family Nurse Practitioner, [facility] for [employer], [facility] 10/28/2018 4:26 PM

## 2018-10-29 LAB — HSV-2 IGG SUPPLEMENTAL TEST: HSV-2 IgG Supplemental Test: POSITIVE — AB

## 2018-10-29 LAB — HEMOGLOBIN A1C
Est. average glucose Bld gHb Est-mCnc: 100 mg/dL
Hgb A1c MFr Bld: 5.1 % (ref 4.8–5.6)

## 2018-10-29 LAB — HSV(HERPES SIMPLEX VRS) I + II AB-IGG
HSV 1 Glycoprotein G Ab, IgG: <0.91 index (ref 0.00–0.90)
HSV 2 IgG, Type Spec: 4.96 index — ABNORMAL HIGH (ref 0.00–0.90)

## 2018-10-30 ENCOUNTER — Encounter: Payer: Self-pay | Admitting: Nurse Practitioner

## 2018-11-02 ENCOUNTER — Telehealth: Payer: Self-pay | Admitting: General Practice

## 2018-11-02 NOTE — Telephone Encounter (Signed)
Patient called and left message on nurse voicemail line stating she was told she recently had a UTI but wasn't treated- was just told to drink lots of water. Patient states her symptoms are now getting worse and she needs a prescription.   Called patient, no answer- left message stating we are trying to reach you to return your phone call, please call us back if you still have questions/concerns.

## 2018-11-03 LAB — CYTOLOGY - PAP
Bacterial vaginitis: POSITIVE — AB
Candida vaginitis: NEGATIVE
Chlamydia: NEGATIVE
Diagnosis: UNDETERMINED — AB
HPV: DETECTED — AB
Neisseria Gonorrhea: NEGATIVE

## 2018-11-04 ENCOUNTER — Ambulatory Visit (INDEPENDENT_AMBULATORY_CARE_PROVIDER_SITE_OTHER): Payer: BLUE CROSS/BLUE SHIELD | Admitting: Emergency Medicine

## 2018-11-04 DIAGNOSIS — N390 Urinary tract infection, site not specified: Secondary | ICD-10-CM | POA: Diagnosis not present

## 2018-11-04 LAB — OBSTETRIC PANEL, INCLUDING HIV
Antibody Screen: NEGATIVE
Basophils Absolute: 0.1 10*3/uL (ref 0.0–0.2)
Basos: 1 %
EOS (ABSOLUTE): 0.1 10*3/uL (ref 0.0–0.4)
Eos: 1 %
HIV SCREEN 4TH GENERATION: NONREACTIVE
Hematocrit: 33 % — ABNORMAL LOW (ref 34.0–46.6)
Hemoglobin: 11.1 g/dL (ref 11.1–15.9)
Hepatitis B Surface Ag: NEGATIVE
Immature Grans (Abs): 0 10*3/uL (ref 0.0–0.1)
Immature Granulocytes: 0 %
Lymphocytes Absolute: 3.3 10*3/uL — ABNORMAL HIGH (ref 0.7–3.1)
Lymphs: 48 %
MCH: 26.6 pg (ref 26.6–33.0)
MCHC: 33.6 g/dL (ref 31.5–35.7)
MCV: 79 fL (ref 79–97)
Monocytes Absolute: 0.3 10*3/uL (ref 0.1–0.9)
Monocytes: 4 %
Neutrophils Absolute: 3.3 10*3/uL (ref 1.4–7.0)
Neutrophils: 46 %
Platelets: 295 10*3/uL (ref 150–450)
RBC: 4.17 x10E6/uL (ref 3.77–5.28)
RDW: 15.8 % — ABNORMAL HIGH (ref 11.7–15.4)
RPR Ser Ql: NONREACTIVE
Rh Factor: POSITIVE
Rubella Antibodies, IGG: 1.57 index (ref >0.99)
WBC: 7.1 10*3/uL (ref 3.4–10.8)

## 2018-11-04 LAB — HEMOGLOBINOPATHY EVALUATION
Ferritin: 128 ng/mL (ref 15–150)
HGB F QUANT: 0 % (ref 0.0–2.0)
Hgb A2 Quant: 2.9 % (ref 1.8–3.2)
Hgb A: 59.5 % — ABNORMAL LOW (ref 96.4–98.8)
Hgb C: 37.6 % — ABNORMAL HIGH
Hgb S: 0 %
Hgb Solubility: NEGATIVE
Hgb Variant: 0 %

## 2018-11-04 LAB — INHERITEST(R) CF/SMA PANEL

## 2018-11-04 LAB — ALPHA-THALASSEMIA

## 2018-11-04 MED ORDER — CEPHALEXIN 500 MG PO CAPS: 500.0000 mg | ORAL_CAPSULE | Freq: Two times a day (BID) | ORAL | 0 refills | Status: AC

## 2018-11-04 NOTE — Progress Notes (Signed)
Pt presented to the office today with complaints of worsening urinary symptoms since last visit on 1/22. Per protocol urine culture was ordered and a prescription was sent to pt pharmacy for Keflex 500 mg BID x7 days. Pt was informed that she would be contacted if there needed to be a change in the antibiotic treatment. Otherwise advised to finish entire prescription. Pt verbalized understanding and had no further questions.   Felecia Jan, RN 11/04/18

## 2018-11-04 NOTE — Progress Notes (Signed)
I have reviewed the chart and agree with nursing staff's documentation of this patient's encounter.  Vonzella Nipple, PA-C 11/04/2018 12:20 PM

## 2018-11-05 ENCOUNTER — Encounter: Payer: Self-pay | Admitting: Family Medicine

## 2018-11-05 ENCOUNTER — Telehealth: Payer: Self-pay | Admitting: Family Medicine

## 2018-11-05 NOTE — Telephone Encounter (Signed)
Called pt to let her know that her accomodation request has been completed. No answer, LVM that she can pick whenever she can.

## 2018-11-06 LAB — URINE CULTURE

## 2018-11-09 ENCOUNTER — Encounter: Payer: Self-pay | Admitting: *Deleted

## 2018-11-11 ENCOUNTER — Telehealth: Payer: Self-pay | Admitting: Obstetrics and Gynecology

## 2018-11-11 ENCOUNTER — Encounter: Payer: Self-pay | Admitting: Nurse Practitioner

## 2018-11-11 ENCOUNTER — Telehealth: Payer: Self-pay | Admitting: *Deleted

## 2018-11-11 DIAGNOSIS — O2340 Unspecified infection of urinary tract in pregnancy, unspecified trimester: Secondary | ICD-10-CM | POA: Insufficient documentation

## 2018-11-11 DIAGNOSIS — D582 Other hemoglobinopathies: Secondary | ICD-10-CM

## 2018-11-11 DIAGNOSIS — R87619 Unspecified abnormal cytological findings in specimens from cervix uteri: Secondary | ICD-10-CM

## 2018-11-11 DIAGNOSIS — D573 Sickle-cell trait: Secondary | ICD-10-CM | POA: Insufficient documentation

## 2018-11-11 HISTORY — DX: Other hemoglobinopathies: D58.2

## 2018-11-11 HISTORY — DX: Unspecified abnormal cytological findings in specimens from cervix uteri: R87.619

## 2018-11-11 NOTE — Telephone Encounter (Signed)
-----   Message from Currie Paris, NP sent at 11/11/2018 10:40 AM EST ----- Abnormal pap and recommended colpo at this time in pregnancy.  Please call and discuss with client - NOT cancer, but to evaluated abnormal cells on the cervix.  HPV was positive.  Please schedule colpo visit.

## 2018-11-11 NOTE — Telephone Encounter (Signed)
I called Barbara Moore and explained her pap results and recommend colposcopy. I explained what a colposcopy is and that we will either do it at her next ob visit or call her with an appointment. She voices understanding.

## 2018-11-11 NOTE — Telephone Encounter (Signed)
I called Barbara Moore as requested to discuss results of pap and need for colposcopy. I left a message I am calling to discuss non-urgent results and we will call back once or you may call us. Pt. Does have MyChart but was trying to discuss by phone if possible. Once we talk with patient or attempt once more - can send MyChart message. I will put note on next ob appt for colpo.

## 2018-11-11 NOTE — Telephone Encounter (Signed)
Please see other call from today.

## 2018-11-12 DIAGNOSIS — Z029 Encounter for administrative examinations, unspecified: Secondary | ICD-10-CM

## 2018-11-17 ENCOUNTER — Telehealth: Payer: Self-pay | Admitting: General Practice

## 2018-11-17 NOTE — Telephone Encounter (Signed)
Barbara Moore called and left message on nurse voicemail line stating she is the patient's HR representative and we filled out some papers on her behalf. She states she is following up and has some questions & requests call back at [phone].

## 2018-11-18 ENCOUNTER — Telehealth: Payer: Self-pay | Admitting: General Practice

## 2018-11-18 NOTE — Telephone Encounter (Signed)
Barbara Moore, patient's HR representative called and left message stating she is returning a call to Bennie Hind to process the patient's leave paperwork

## 2018-11-23 ENCOUNTER — Encounter: Payer: Self-pay | Admitting: *Deleted

## 2018-11-25 ENCOUNTER — Ambulatory Visit (INDEPENDENT_AMBULATORY_CARE_PROVIDER_SITE_OTHER): Payer: BLUE CROSS/BLUE SHIELD | Admitting: Medical

## 2018-11-25 ENCOUNTER — Ambulatory Visit: Payer: BLUE CROSS/BLUE SHIELD | Admitting: Clinical

## 2018-11-25 ENCOUNTER — Other Ambulatory Visit: Payer: Self-pay

## 2018-11-25 VITALS — BP 122/74 | HR 96 | Wt 300.2 lb

## 2018-11-25 DIAGNOSIS — Z3400 Encounter for supervision of normal first pregnancy, unspecified trimester: Secondary | ICD-10-CM

## 2018-11-25 DIAGNOSIS — Z3402 Encounter for supervision of normal first pregnancy, second trimester: Secondary | ICD-10-CM

## 2018-11-25 DIAGNOSIS — Z3482 Encounter for supervision of other normal pregnancy, second trimester: Secondary | ICD-10-CM | POA: Diagnosis not present

## 2018-11-25 DIAGNOSIS — Z6841 Body Mass Index (BMI) 40.0 and over, adult: Secondary | ICD-10-CM

## 2018-11-25 DIAGNOSIS — Z3A16 16 weeks gestation of pregnancy: Secondary | ICD-10-CM

## 2018-11-25 NOTE — Progress Notes (Signed)
   PRENATAL VISIT NOTE  Subjective:  Barbara Moore is a 28 y.o. G1P0 at [redacted]w[redacted]d being seen today for ongoing prenatal care.  She is currently monitored for the following issues for this low-risk pregnancy and has Supervision of low-risk first pregnancy; HSV-2 infection; BMI 45.0-49.9, adult (HCC); UTI in pregnancy; and Abnormal Pap smear of cervix on their problem list.  Patient reports no complaints.  Contractions: Not present. Vag. Bleeding: None.  Movement: Absent. Denies leaking of fluid.   The following portions of the patient's history were reviewed and updated as appropriate: allergies, current medications, past family history, past medical history, past social history, past surgical history and problem list. Problem list updated.  Objective:   Vitals:   11/25/18 1526  BP: 122/74  Pulse: 96  Weight: (!) 300 lb 3.2 oz (136.2 kg)    Fetal Status: Fetal Heart Rate (bpm): 157   Movement: Absent     General:  Alert, oriented and cooperative. Patient is in no acute distress.  Skin: Skin is warm and dry. No rash noted.   Cardiovascular: Normal heart rate noted  Respiratory: Normal respiratory effort, no problems with respiration noted  Abdomen: Soft, gravid, appropriate for gestational age.  Pain/Pressure: Absent     Pelvic: Cervical exam deferred        Extremities: Normal range of motion.  Edema: Trace  Mental Status: Normal mood and affect. Normal behavior. Normal judgment and thought content.   Assessment and Plan:  Pregnancy: G1P0 at [redacted]w[redacted]d  1. Encounter for supervision of low-risk first pregnancy, antepartum - AFP, Serum, Open Spina Bifida - Genetic Screening - Panorama showed low fetal fraction at last visit  2. BMI 45.0-49.9, adult (HCC) - Advised to start 81 mg ASA daily   Preterm labor/ second trimester symptoms and general obstetric precautions including but not limited to vaginal bleeding, contractions, leaking of fluid and fetal movement were reviewed in detail with  the patient. Please refer to After Visit Summary for other counseling recommendations.  Return in about 4 weeks (around 12/23/2018) for LOB with colposcopy (with MD).  Future Appointments  Date Time Provider Department Center  12/15/2018  1:15 PM [facility] [place] 4 [facility]    Vonzella Nipple, PA-C

## 2018-11-25 NOTE — Patient Instructions (Signed)
Childbirth Education Options: [facility] Department Classes:  Childbirth education classes can help you get ready for a positive parenting experience. You can also meet other expectant parents and get free stuff for your baby. Each class runs for five weeks on the same night and costs $45 for the mother-to-be and her support person. Medicaid covers the cost if you are eligible. Call [phone] to register. [facility] Childbirth Education:  [phone] or [phone] or [email or web address]  Baby & Me Class: Discuss newborn & infant parenting and family adjustment issues with other new mothers in a relaxed environment. Each week brings a new speaker or baby-centered activity. We encourage new mothers to join [place] every Thursday at 11:00am. Babies birth until crawling. No registration or fee. Daddy [employer]: This course offers Dads-to-be the tools and knowledge needed to feel confident on their journey to becoming new fathers. Experienced dads, who have been trained as coaches, teach dads-to-be how to hold, comfort, diaper, swaddle and play with their infant while being able to support the new mom as well. A class for men taught by men. $25/dad Big Brother/Big Sister: Let your children share in the joy of a new brother or sister in this special class designed just for them. Class includes discussion about how families care for babies: swaddling, holding, diapering, safety as well as how they can be helpful in their new role. This class is designed for children ages 45 to 48, but any age is welcome. Please register each child individually. $5/child  Mom Talk: This mom-led group offers support and connection to mothers as they journey through the adjustments and struggles of that sometimes overwhelming first year after the birth of a child. Tuesdays at 10:00am and Thursdays at 6:00pm. Babies welcome. No registration or fee. Breastfeeding Support Group: This group is a mother-to-mother  support circle where moms have the opportunity to share their breastfeeding experiences. A Lactation Consultant is present for questions and concerns. Meets each Tuesday at 11:00am. No fee or registration. Breastfeeding Your Baby: Learn what to expect in the first days of breastfeeding your newborn.  This class will help you feel more confident with the skills needed to begin your breastfeeding experience. Many new mothers are concerned about breastfeeding after leaving the hospital. This class will also address the most common fears and challenges about breastfeeding during the first few weeks, months and beyond. (call for fee) Comfort Techniques and Tour: This 2 hour interactive class will provide you the opportunity to learn & practice hands-on techniques that can help relieve some of the discomfort of labor and encourage your baby to rotate toward the best position for birth. You and your partner will be able to try a variety of labor positions with birth balls and rebozos as well as practice breathing, relaxation, and visualization techniques. A tour of the [facility] is included with this class. $20 per registrant and support person Childbirth Class- Weekend Option: This class is a Weekend version of our Birth & Baby series. It is designed for parents who have a difficult time fitting several weeks of classes into their schedule. It covers the care of your newborn and the basics of labor and childbirth. It also includes a [place] of [facility] and lunch. The class is held two consecutive days: beginning on Friday evening from 6:30 - 8:30 p.m. and the next day, Saturday from 9 a.m. - 4 p.m. (call for fee) Doren Custard Class: Interested in a waterbirth?  This  informational class will help you discover whether waterbirth is the right fit for you. Education about waterbirth itself, supplies you would need and how to assemble your support team is what you can  expect from this class. Some obstetrical practices require this class in order to pursue a waterbirth. (Not all obstetrical practices offer waterbirth-check with your healthcare provider.) Register only the expectant mom, but you are encouraged to bring your partner to class! Required if planning waterbirth, no fee. Infant/Child CPR: Parents, grandparents, babysitters, and friends learn Cardio-Pulmonary Resuscitation skills for infants and children. You will also learn how to treat both conscious and unconscious choking in infants and children. This Family & Friends program does not offer certification. Register each participant individually to ensure that enough mannequins are available. (Call for fee) Grandparent Love: Expecting a grandbaby? This class is for you! Learn about the latest infant care and safety recommendations and ways to support your own child as he or she transitions into the parenting role. Taught by Registered Nurses who are childbirth instructors, but most importantly...they are grandmothers too! $10/person. Childbirth Class- Natural Childbirth: This series of 5 weekly classes is for expectant parents who want to learn and practice natural methods of coping with the process of labor and childbirth. Relaxation, breathing, massage, visualization, role of the partner, and helpful positioning are highlighted. Participants learn how to be confident in their body's ability to give birth. This class will empower and help parents make informed decisions about their own care. Includes discussion that will help new parents transition into the immediate postpartum period. Maternity Care Center Tour of [facility] is included. We suggest taking this class between 25-32 weeks, but it's only a recommendation. $75 per registrant and one support person or $30 Medicaid. Childbirth Class- 3 week Series: This option of 3 weekly classes helps you and your labor partner prepare for childbirth. Newborn  care, labor & birth, cesarean birth, pain management, and comfort techniques are discussed and a Maternity Care Center Tour of [facility] is included. The class meets at the same time, on the same day of the week for 3 consecutive weeks beginning with the starting date you choose. $60 for registrant and one support person.  Marvelous Multiples: Expecting twins, triplets, or more? This class covers the differences in labor, birth, parenting, and breastfeeding issues that face multiples' parents. NICU tour is included. Led by a Certified Childbirth Educator who is the mother of twins. No fee. Caring for Baby: This class is for expectant and adoptive parents who want to learn and practice the most up-to-date newborn care for their babies. Focus is on birth through the first six weeks of life. Topics include feeding, bathing, diapering, crying, umbilical cord care, circumcision care and safe sleep. Parents learn to recognize symptoms of illness and when to call the pediatrician. Register only the mom-to-be and your partner or support person can plan to come with you! $10 per registrant and support person Childbirth Class- online option: This online class offers you the freedom to complete a Birth and Baby series in the comfort of your own home. The flexibility of this option allows you to review sections at your own pace, at times convenient to you and your support people. It includes additional video information, animations, quizzes, and extended activities. Get organized with helpful eClass tools, checklists, and trackers. Once you register online for the class, you will receive an email within a few days to accept the invitation and begin the class when the time   is right for you. The content will be available to you for 60 days. $60 for 60 days of online access for you and your support people.  Local Doulas: Natural Baby Doulas [email or web address] Tel:  [phone] [email or web address] [employer] [phone] [email or web address] .[email or web address].[email or web address] The Labor Hassell Halim  (also do waterbirth tub rental) [phone] [email or web address] Triad Birth Doula [phone] [email or web address] NotebookDistributors.fi Sacred Rhythms  [phone] https://sacred-rhythms.com/ [employer] (PADA) [email or web address] https://www.frey.org/ [place] Birth and Baby  http://[email or web address]/ Considering Waterbirth? Guide for patients at [facility] [employer]  Why consider waterbirth?  . Gentle birth for babies . Less pain medicine used in labor . May allow for passive descent/less pushing . May reduce perineal tears  . More mobility and instinctive maternal position changes . Increased maternal relaxation . Reduced blood pressure in labor  Is waterbirth safe? What are the risks of infection, drowning or other complications?  . Infection: o Very low risk (3.7 % for tub vs 4.8% for bed) o 7 in 8000 waterbirths with documented infection o Poorly cleaned equipment most common cause o Slightly lower group B strep transmission rate  . Drowning o Maternal:  - Very low risk   - Related to seizures or fainting o Newborn:  - Very low risk. No evidence of increased risk of respiratory problems in multiple large studies - Physiological protection from breathing under water - Avoid underwater birth if there are any fetal complications - Once baby's head is out of the water, keep it out.  . Birth complication o Some reports of cord trauma, but risk decreased by bringing baby to surface gradually o No evidence of increased risk of shoulder dystocia. Mothers can usually change positions faster in water than in a bed, possibly aiding the maneuvers to free the shoulder.   You must attend a Doren Custard class at [facility]  3rd Wednesday of every month from 7-9pm  [employer] by calling [phone] or online at [email or web address]  Bring [place] the certificate from the class to your prenatal appointment  Meet with a midwife at 36 weeks to see if you can still plan a waterbirth and to sign the consent.   Purchase or rent the following supplies:   Water Birth Pool (Birth Pool in a Box or [place] for instance)  (Tubs start ~$125)  Single-use disposable tub liner designed for your brand of tub  New garden hose labeled "lead-free", "suitable for drinking water",  Electric drain pump to remove water (We recommend 792 gallon per hour or greater pump.)   Separate garden hose to remove the dirty water  Fish net  Bathing suit top (optional)  Long-handled mirror (optional)  Places to purchase or rent supplies  GotWebTools.is for tub purchases and supplies  Waterbirthsolutions.com for tub purchases and supplies  The Labor Ladies (www.thelaborladies.com) $275 for tub rental/set-up & take down/kit   [employer] (http://www.fleming.com/.htm) Information regarding doulas (labor support) who provide pool rentals  Our practice has a Birth Pool in a Box tub at the hospital that you may borrow on a first-come-first-served basis. It is your responsibility to to set up, clean and break down the tub. We cannot guarantee the availability of this tub in advance. You are responsible for bringing all accessories listed above. If you do not have all necessary supplies you cannot have a waterbirth.    Things that would prevent you from having a waterbirth:  Premature, <37wks  Previous cesarean birth  Presence of thick meconium-stained fluid  Multiple gestation (Twins,  triplets, etc.)  Uncontrolled diabetes or gestational diabetes requiring medication  Hypertension requiring medication or diagnosis of pre-eclampsia  Heavy vaginal bleeding  Non-reassuring fetal  heart rate  Active infection (MRSA, etc.). Group B Strep is NOT a contraindication for  waterbirth.  If your labor has to be induced and induction method requires continuous  monitoring of the baby's heart rate  Other risks/issues identified by your obstetrical provider  Please remember that birth is unpredictable. Under certain unforeseeable circumstances your provider may advise against giving birth in the tub. These decisions will be made on a case-by-case basis and with the safety of you and your baby as our highest priority.   AREA PEDIATRIC/FAMILY PRACTICE PHYSICIANS  Central/Southeast  [phone]) . [facility] Davy Pique, MD; Gwendlyn Deutscher, MD; Walker Kehr, MD; Andria Frames, MD; McDiarmid, MD; Dutch Quint, MD; Nori Riis, MD; Mingo Amber, [place] [place], [place] [address] o [phone] o Mon-Fri 8:30-12:30, 1:30-5:00 o Providers come to see babies at [facility] o Accepting Medicaid . [place] [facility] [place] providers who accept newborns: Dorthy Cooler, MD; Orland Mustard, MD; Stephanie Acre, MD o [place], [place], [place] [address] o [phone] o Mon-Fri 8:00-5:30 o Babies seen by providers at [facility] o Does NOT accept Medicaid o Please call early in hospitalization for appointment (limited availability)  . [place] Royal Pines, MD o [address] [place], [place] [address] o [phone] o Mon, Tue, Thur, Fri 8:30-5:00, Wed 10:00-7:00 (closed 1-2pm) o Babies seen by [facility] providers o Accepting Medicaid . [place], MD o [place], [place], [place] [address] o [phone] o Mon-Fri 8:30-5:00, Sat 8:30-12:00 o Provider comes to see babies at [place] Medicaid o Must have been referred from current patients or contacted office prior to delivery . [place] [facility] ([place] for  [place]) Franne Forts, MD; Tamera Punt, MD; Doneen Poisson, MD; Fatima Sanger, MD; Wynetta Emery, MD; Jess Barters, MD; Tami Ribas, MD; Herbert Moors, MD; Derrell Lolling, MD; Dorothyann Peng, MD; Lucious Groves, NP; Baldo Ash, NP o [place], [place], [place] [address] o [phone] o Mon, Tue, Thur, Fri 8:30-5:30, Wed 9:30-5:30, Sat 8:30-12:30 o Babies seen by [facility] providers o Accepting Medicaid o Only accepting infants of first-time parents or siblings of current patients [facility] discharge coordinator will make follow-up appointment . Baltazar Najjar o [address], [place], [place]  [address] o [phone]   Fax - [phone] . [facility] o [address], [place], [place]  [address] o Phone - [phone]   Fax [phone] . [place], [place], [place], [place]  [address] o [phone]  [place] [phone]) . [facility] Reginal Lutes, MD; Jacklynn Ganong, MD; Torrie Mayers, MD; MD; Rosana Hoes, MD; Servando Salina, MD; Rose Fillers, MD; Rex Kras, MD; Corinna Capra, MD; Volney American, MD; Trilby Drummer, MD; Janann Colonel, MD; Jimmye Norman, [place], [place], [place] [address] o [phone] o Mon-Fri 8:30-5:00 (extended evenings Mon-Thur as needed), Sat-Sun 10:00-1:00 o Providers come to see babies at [place] Medicaid for families of first-time babies and families with all children in the household age 20 and under. Must register with office prior to making appointment (M-F only). . [place], NP; Tomi Bamberger, MD; Redmond School, MD; [place], [place] [address] [place], [place]  [address] o [phone] o Mon-Fri 8:00-5:00 o Babies seen by providers at [facility] o Does NOT accept  Medicaid/Commercial Insurance Only . [facility] [place] ([facility])  Marnee Guarneri, MD; Drema Dallas, MD; Montine Circle, MD; Vilma Prader, MD; Vanita Panda, MD; Alfonso Ramus, MD; Ruthann Cancer, MD; Roxanne Mins, MD; Rosalva Ferron, MD; Polly Cobia, MD o [address]  [place], [place] [address] o [phone] o Mon-Fri 8:30-5:30, Sat (Oct.-Mar.) 9:00-1:00 o Babies seen by providers at [place] [phone]) . [facility] Peers, MD; Suzan Slick, MD o [address], [place], [place] [address] o [phone] o Mon-Fri 8:30-5:00, Sat 8:30-12:00 o Providers come to see babies at [facility] o Does NOT accept Medicaid . [place] at [place], [place]; [place], MD; [place], [place]; Nancy Fetter, MD; Moreen Fowler, [place], [place], [place] [address] o [phone] o Mon-Fri 8:00-5:00 o Babies seen by providers at [facility] o Does NOT accept Medicaid o Only accepting babies of parents who are patients o Please call early in hospitalization for appointment (limited availability) . [facility] Pediatricians Blanca Friend, MD; Sharlene Motts, MD; Rod Can, MD; Warner Mccreedy, NP; Sabra Heck, MD; Ermalinda Memos, MD; Sharlett Iles, NP; Aurther Loft, MD; Jerrye Beavers, MD; Marcello Moores, MD; Berline Lopes, MD; Charolette Forward, MD o [place]. [place], [place], [place] [address] o [phone] o Mon-Fri 8:00-5:00, Sat 9:00-12:00 o Providers come to see babies at [facility] o Does NOT accept [facility] [phone]) . [place] [facility] [place] providers accepting new patients: Dayna Ramus, NP; [place], [place], [place], [place] [address] o [phone] o Mon-Fri 8:00-5:00 o Babies seen by providers at [facility] o Does NOT accept Medicaid o Only accepting babies of parents who are patients o Please call early in hospitalization for appointment (limited availability) . [facility] Oswaldo Conroy, MD; Sheran Lawless, MD o [address] [place], [place] [address] o [phone] (press 1 to schedule appointment) o Mon-Fri 8:00-5:00 o Providers come to see babies at [facility] o Does NOT accept Medicaid . [facility] Jodi Mourning, MD o [address], [place], [place]  [address] o [phone] o Mon-Fri 8:30-5:00 (lunch 12:30-1:00), extended hours by appointment only Wed 5:00-6:30 o Babies seen by [facility] providers o Accepting Medicaid . [place] at Evalyn Casco, MD; [place], MD; Ethlyn Gallery, MD o [place], [place], [place] [address] o [phone] o Mon-Fri 8:00-5:00 o Babies seen by [facility] providers o Does NOT accept Medicaid . Therapist, music at [place], MD; Yong Channel, MD; [place], [place] [place], [place] [address] o [phone] o Mon-Fri 8:00-5:00 o Babies seen by [facility] providers o Does NOT accept Medicaid . [place], [place]; [place], [place]; [place], NP; Albertina Parr, MD; Frederic Jericho, MD; Ronney Lion, MD; Carlos Levering, NP; Jerelene Redden, NP; Tomasita Crumble, NP; Ronelle Nigh, NP; Corinna Lines, MD; Windber, MD o [address] [place], [place] [address] o [phone] o Mon-Fri 8:30-5:00, Sat 10:00-1:00 o Providers come to see babies at [facility] o Does NOT accept Medicaid o Free prenatal information session Tuesdays at 4:45pm . [facility] Porfirio Oar, MD; [place], [place]; [place], [place]; [place], [place] [place] [address] o [phone] o Mon-Fri 7:30-5:30 o Babies seen by [facility] providers . [facility] Children[facility] Doctor o [address] [place], [place], [place]  [address] o [phone]   Fax - [phone]  [place] [phone] & [phone]) . [place], [place] o [address] [place],  [address] o [phone] o Mon-Thur  8:00-6:00 o Providers come to see babies at [facility] o Accepting Medicaid . [facility] o Anderson, NP; Badger, MD; Beal, PA; Spencer, PA o [address] [place], [place] [address] o [phone] o Mon-Thur 7:30-7:30, Fri 7:30-4:30 o Babies seen by [facility] providers o Accepting Medicaid . [facility] o Agbuya, MD; Klett,  NP; Romgoolam, MD o [address], [place], [place] [address] o [phone] o Mon-Fri 8:30-5:00, Sat 8:30-12:00 o Providers come to see babies at [facility] o Accepting Medicaid o Must have "Meet & Greet" appointment at office prior to delivery . [facility] ([facility] o McCord, MD; [place], MD; Wood, MD o [address], [place], [place] [address] o [phone] o Mon-Wed 8:00-6:00, Thur-Fri 8:00-5:00, Sat 9:00-12:00 o Providers come to see babies at [facility] o Does NOT accept Medicaid o Only accepting siblings of current patients . [facility]  o [address], [place], [place]  [address] o [phone]   Fax - [phone] . [facility] [place] o [address], [place], [place]  [address] o [phone]   Fax - [phone]  [place] ([address] & [address]) . [facility] [place] o Cirigliano, DO; Matthews, DO o [address] [place], [place] [address] o [phone] o Mon-Fri 7:00-5:00 o Babies seen by [facility] providers o Does NOT accept Medicaid . [facility] o Briscoe, MD; Howley, PA; Moreira, PA o [address], [place], [place] [address] o [phone] o Mon-Fri 8:00-5:00 o Babies seen by [facility] providers o Accepting Medicaid . [facility] o Boyd, MD; Church, PA; Jones, NP; Osborn, PA o [address], , [place] [address] o [phone] o Mon-Fri 8:00-5:00 o Babies seen by providers at [facility] o Accepting Medicaid  [place] ([address]) . [facility] [place] o Wendling, DO o [address] [place], [place] [address] o [phone] o Mon-Fri 8:00-5:00 o Babies seen by [facility] providers o Does NOT accept  Medicaid o Limited availability, please call early in hospitalization to schedule follow-up . [facility] o Calderon, PA; Cummings, MD; Dillard, MD; [place], PA; Olson, MD; VanDeven, PA o [address], [place], [place] [address] o [phone] o Mon-Fri 8:30-5:00, Sat 9:00-12:00 o Babies seen by providers at [facility] o Accepting Medicaid o Please register online then schedule online or call office o [email or web address] . [facility] ([facility] o Hunter, NP; Kumar, MD; [place] Rogers, PA o [address], [place], [place] [address] o [phone] o Mon-Fri 8:00-5:00 o Babies seen by providers at [facility] o Accepting Medicaid . [facility] ([facility]) o [place], MD; Kristi Fleenor, NP; [place], MD o [address], [place], [place] [address] o [phone] o Mon-Fri 8:00-5:30, Sat&Sun by appointment (phones open at 8:30) o Babies seen by [facility] providers o Accepting Medicaid o Must be a first-time baby or sibling of current patient . [facility] [place]  o [address], [place], [place]  [address] o [phone]   Fax - [phone]  [place] ([address]) . [facility] o Brown, PA; Cowen, PA; Rice, MD; [place], PA; Spry, MD o [address] [place], [place] [address] o [phone] o Mon-Thur 8:00-7:00, Fri 8:00-5:00, Sat 8:00-12:00, Sun 9:00-12:00 o Babies seen by [facility] providers o Accepting Medicaid . [facility] at [facility], MD; Ruthann Cancer, MD; [facility], MD o [address], [place], [place] [address] o [phone] o Mon-Thur 8:00-5:00 o Babies seen by providers at [facility] o Accepting Medicaid . [facility] at [place], MD; Coe-Goins, MD; Amedeo Plenty,  MD; Bobby Rumpf, MD; List, MD; Lavonia Drafts, MD; Ruthann Cancer, MD; Selinda Eon, MD; Audie Box, MD; Jim Like, MD; Christie Nottingham, MD; Hubbard Hartshorn, MD; Modena Nunnery, MD o [address] [place], [place] [address] o [phone] o Mon-Fri 8:00-5:30, Sat (Oct.-Mar.) 9:00-1:00 o Babies seen by providers at [facility] o Accepting Medicaid o Must fill out new patient packet, available online at [email or web address] . [place] ([facility]) Barnabas Lister, NP; Kenton Kingfisher, NP; Claiborne Billings, NP; Rolla Plate, MD; [place], [place]; Carola Rhine, MD; Tyron Russell, MD; Delia Chimes, NP o [address], [place], [place] [address] o [phone] o Mon-Thur 8:00-5:30, Fri 8:00-5:00 o Babies seen by providers at [place] [phone]) . [place], [place]; Perkins, MD; Dennard Schaumann, MD; [place], [place] o [address] [place], [place] [address] o [phone] o Mon-Fri 8:00-5:00 o Babies seen by providers at [place] [phone]) . [place] at [place], [place]; Olen Pel, MD; [place], [place], [place], [place] [address] o [phone] o Mon-Fri 8:00-5:00 o Babies seen by providers at [facility] o Does NOT accept Medicaid o Limited appointment availability, please call early in hospitalization  . Therapist, music at [place], [place]; [place], [address] [place], [place] [address] o [phone] o Mon-Fri 8:00-5:00 o Babies seen by [facility] providers o Does NOT accept Medicaid . [facility] Su Grand, MD; Guy Sandifer, MD; [address] [place], [place]; [place], [address], [place], [place] [address] o [phone] o Mon-Fri 8:00-5:00 o After hours clinic [facility] [place], [place] [address]) [phone] Mon-Fri 5:00-8:00, Sat 12:00-6:00, Sun 10:00-4:00 o Babies seen by [facility]  providers o Accepting Medicaid . [place] at [facility] o [address], [place], [place]  [address] o [phone]   Fax - [phone]  [place] [phone]) . Therapist, music at [facility], MD o [address] [place], [place], [place] [address] o [phone] o Mon-Fri 8:00-5:00 o Babies seen by [facility] providers o Does NOT accept Medicaid . [place] ([place] [facility] [place]) Bing Neighbors, MD o [address] [place], [place],  [address] o [phone] o Mon-Thur 8:00-7:00, Fri 8:00-5:00, Sat 8:00-12:00 o Babies seen by providers at [facility] o Accepting Medicaid - but does not have vaccinations in office (must be received elsewhere) o Limited availability, please call early in hospitalization  [place] ([id number]) . [place], [place] [address], [place] [address] o [phone]  Fax [phone]

## 2018-11-25 NOTE — BH Specialist Note (Signed)
Integrated Behavioral Health Initial Visit  MRN: [id number] Name: Barbara Moore  Number of Integrated Behavioral Health Clinician visits:: 1/6 Session Start time: 3:24  Session End time: 3:34 Total time: 15 minutes  Type of Service: Integrated Behavioral Health- Individual/Family Interpretor:No. Interpretor Name and Language: n/a   Warm Hand Off Completed.       SUBJECTIVE: Barbara Moore is a 28 y.o. female accompanied by n/a Patient was referred by Vonzella Nipple, PA-C for Initial OB introduction to integrated behavioral health services. Patient reports the following symptoms/concerns: Pt states no particular problem today; history of depression.  Duration of problem: n/a; Severity of problem: n/a  OBJECTIVE: Mood: Normal and Affect: Appropriate Risk of harm to self or others: No plan to harm self or others  LIFE CONTEXT: Family and Social: - School/Work: - Self-Care: - Life Changes: Current pregnancy  GOALS ADDRESSED: Patient will: 1. Increase knowledge and/or ability of: healthy habits  2. Demonstrate ability to: Increase healthy adjustment to current life circumstances  INTERVENTIONS: Interventions utilized: Psychoeducation and/or Health Education  Standardized Assessments completed: Not given today  ASSESSMENT: Patient currently experiencing Supervision of low-risk pregnancy, antepartum   Patient may benefit from Initial OB introduction to integrated behavioral health services .  PLAN: 1. Follow up with behavioral health clinician on : As needed 2. Behavioral recommendations:  -Begin taking prenatal vitamin, as recommended by medical provider 3. Referral(s): Integrated [employer] (In Clinic) 4. "From scale of 1-10, how likely are you to follow plan?": 10  Rae Lips, LCSW  Depression screen [facility] 2/9 10/28/2018 10/23/2018  Decreased Interest 0 0  Down, Depressed, Hopeless 0 0  PHQ - 2 Score 0 0  Altered sleeping 1 0  Tired, decreased  energy 1 2  Change in appetite 0 2  Feeling bad or failure about yourself  0 0  Trouble concentrating 0 0  Moving slowly or fidgety/restless 0 0  Suicidal thoughts 0 0  PHQ-9 Score 2 4   GAD 7 : Generalized Anxiety Score 10/28/2018 10/23/2018  Nervous, Anxious, on Edge 0 0  Control/stop worrying 0 1  Worry too much - different things 0 1  Trouble relaxing 0 0  Restless 0 0  Easily annoyed or irritable 0 1  Afraid - awful might happen 0 0  Total GAD 7 Score 0 3

## 2018-11-27 LAB — AFP, SERUM, OPEN SPINA BIFIDA
AFP MoM: 1.44
AFP Value: 35.4 ng/mL
Gest. Age on Collection Date: 16 weeks
Maternal Age At EDD: 26.6 yr
OSBR RISK 1 IN: 6441
Test Results:: NEGATIVE
Weight: 300 [lb_av]

## 2018-12-15 ENCOUNTER — Ambulatory Visit (HOSPITAL_COMMUNITY)
Admission: RE | Admit: 2018-12-15 | Discharge: 2018-12-15 | Disposition: A | Discharge status: Home / Self Care | Payer: BLUE CROSS/BLUE SHIELD | Source: Ambulatory Visit | Attending: Nurse Practitioner | Admitting: Nurse Practitioner

## 2018-12-15 ENCOUNTER — Other Ambulatory Visit: Payer: Self-pay | Admitting: Nurse Practitioner

## 2018-12-15 ENCOUNTER — Other Ambulatory Visit (HOSPITAL_COMMUNITY): Payer: Self-pay | Admitting: *Deleted

## 2018-12-15 DIAGNOSIS — Z3A19 19 weeks gestation of pregnancy: Secondary | ICD-10-CM | POA: Diagnosis not present

## 2018-12-15 DIAGNOSIS — Z3400 Encounter for supervision of normal first pregnancy, unspecified trimester: Secondary | ICD-10-CM

## 2018-12-15 DIAGNOSIS — Z362 Encounter for other antenatal screening follow-up: Secondary | ICD-10-CM

## 2018-12-15 DIAGNOSIS — O99212 Obesity complicating pregnancy, second trimester: Secondary | ICD-10-CM | POA: Diagnosis not present

## 2018-12-15 DIAGNOSIS — Z363 Encounter for antenatal screening for malformations: Secondary | ICD-10-CM | POA: Diagnosis not present

## 2018-12-22 ENCOUNTER — Telehealth: Payer: Self-pay | Admitting: Obstetrics & Gynecology

## 2018-12-22 ENCOUNTER — Encounter: Payer: Self-pay | Admitting: *Deleted

## 2018-12-22 NOTE — Telephone Encounter (Signed)
Called and left a VM about only one person with them at the visit.

## 2018-12-23 ENCOUNTER — Encounter: Payer: Self-pay | Admitting: Obstetrics & Gynecology

## 2019-01-13 ENCOUNTER — Ambulatory Visit (HOSPITAL_COMMUNITY): Payer: BLUE CROSS/BLUE SHIELD

## 2019-02-02 ENCOUNTER — Other Ambulatory Visit (HOSPITAL_COMMUNITY): Payer: Self-pay | Admitting: *Deleted

## 2019-02-02 ENCOUNTER — Ambulatory Visit (HOSPITAL_COMMUNITY)
Admission: RE | Admit: 2019-02-02 | Discharge: 2019-02-02 | Disposition: A | Discharge status: Home / Self Care | Payer: BLUE CROSS/BLUE SHIELD | Source: Ambulatory Visit | Attending: Maternal & Fetal Medicine | Admitting: Maternal & Fetal Medicine

## 2019-02-02 ENCOUNTER — Ambulatory Visit (HOSPITAL_COMMUNITY): Payer: BLUE CROSS/BLUE SHIELD

## 2019-02-02 ENCOUNTER — Other Ambulatory Visit: Payer: Self-pay

## 2019-02-02 DIAGNOSIS — Z362 Encounter for other antenatal screening follow-up: Secondary | ICD-10-CM | POA: Insufficient documentation

## 2019-02-02 DIAGNOSIS — O99210 Obesity complicating pregnancy, unspecified trimester: Secondary | ICD-10-CM

## 2019-02-03 ENCOUNTER — Telehealth: Payer: Self-pay | Admitting: Obstetrics and Gynecology

## 2019-02-03 ENCOUNTER — Ambulatory Visit (INDEPENDENT_AMBULATORY_CARE_PROVIDER_SITE_OTHER): Payer: BLUE CROSS/BLUE SHIELD | Admitting: Medical

## 2019-02-03 DIAGNOSIS — Z3A26 26 weeks gestation of pregnancy: Secondary | ICD-10-CM

## 2019-02-03 DIAGNOSIS — Z3400 Encounter for supervision of normal first pregnancy, unspecified trimester: Secondary | ICD-10-CM

## 2019-02-03 DIAGNOSIS — B009 Herpesviral infection, unspecified: Secondary | ICD-10-CM

## 2019-02-03 DIAGNOSIS — Z6841 Body Mass Index (BMI) 40.0 and over, adult: Secondary | ICD-10-CM

## 2019-02-03 DIAGNOSIS — R87610 Atypical squamous cells of undetermined significance on cytologic smear of cervix (ASC-US): Secondary | ICD-10-CM

## 2019-02-03 DIAGNOSIS — O98812 Other maternal infectious and parasitic diseases complicating pregnancy, second trimester: Secondary | ICD-10-CM

## 2019-02-03 NOTE — Progress Notes (Signed)
I connected with@ on 02/03/19 at  1:55 PM EDT by: WebEx and verified that I am speaking with the correct person using two identifiers.  Patient is located at home and provider is located at [facility].     The purpose of this virtual visit is to provide medical care while limiting exposure to the novel coronavirus. I discussed the limitations, risks, security and privacy concerns of performing an evaluation and management service by WebEx and the availability of in person appointments. I also discussed with the patient that there may be a patient responsible charge related to this service. By engaging in this virtual visit, you consent to the provision of healthcare.  Additionally, you authorize for your insurance to be billed for the services provided during this visit.  The patient expressed understanding and agreed to proceed.  The following staff members participated in the virtual visit:  Corinda Gubler, CMA    PRENATAL VISIT NOTE  Subjective:  Gwendolynne Debonis is a 28 y.o. G1P0 at [redacted]w[redacted]d  for phone visit for ongoing prenatal care.  She is currently monitored for the following issues for this low-risk pregnancy and has Supervision of low-risk first pregnancy; HSV-2 infection; BMI 45.0-49.9, adult (HCC); UTI in pregnancy; and Abnormal Pap smear of cervix on their problem list.  Patient reports backache and fatigue.  Contractions: Not present. Vag. Bleeding: None.  Movement: Present. Denies leaking of fluid.   The following portions of the patient's history were reviewed and updated as appropriate: allergies, current medications, past family history, past medical history, past social history, past surgical history and problem list.   Objective:  There were no vitals filed for this visit. Self-Obtained  Fetal Status:     Movement: Present     Assessment and Plan:  Pregnancy: G1P0 at [redacted]w[redacted]d 1. Encounter for supervision of low-risk first pregnancy, antepartum - CHL AMB BABYSCRIPTS SCHEDULE OPTIMIZATION  2. BMI 45.0-49.9, adult (HCC) - Growth scan yesterday - 76%til growth  3. HSV-2 infection - Will need ppx at 36 weeks  4. Atypical squamous cells of undetermined significance on cytologic smear of cervix (ASC-US) - Needs [place], postponed due to COVID-19  Preterm labor symptoms and general obstetric precautions including but not limited to vaginal bleeding, contractions, leaking of fluid and fetal movement were reviewed in detail with the patient.  Return in about 2 weeks (around 02/17/2019) for In-Person, LOB, 28 week labs (fasting).  Future Appointments  Date Time Provider Department Center  02/19/2019  8:20 AM WOC-WOCA LAB WOC-WOCA WOC  02/19/2019  9:15 AM [place] Staggers, MD WOC-WOCA WOC  03/16/2019  1:45 PM WH-MFC [place] [address] WH-MFCUS MFC-US     Time spent on virtual visit: 20 minutes  Vonzella Nipple, PA-C

## 2019-02-03 NOTE — Patient Instructions (Signed)

## 2019-02-03 NOTE — Progress Notes (Signed)
Sent BRx email, had pt to register while on phone. Advised BP Cuff should come within a week, if not to let us know. Pt verbalized understanding.

## 2019-02-03 NOTE — Telephone Encounter (Signed)
The patient called in to request a virtual or face to face appointment. Stated she has not been seen in a while. Scheduled the patient for an appointment today as there were opening available and also scheduled for the 28 week lab appointment.

## 2019-02-18 ENCOUNTER — Encounter (HOSPITAL_COMMUNITY): Payer: Self-pay | Admitting: *Deleted

## 2019-02-18 ENCOUNTER — Telehealth: Payer: Self-pay | Admitting: Obstetrics and Gynecology

## 2019-02-18 ENCOUNTER — Other Ambulatory Visit: Payer: Self-pay

## 2019-02-18 ENCOUNTER — Emergency Department (HOSPITAL_COMMUNITY)
Admission: EM | Admit: 2019-02-18 | Discharge: 2019-02-18 | Disposition: A | Discharge status: Home / Self Care | Payer: BLUE CROSS/BLUE SHIELD | Attending: Emergency Medicine | Admitting: Emergency Medicine

## 2019-02-18 DIAGNOSIS — W01198A Fall on same level from slipping, tripping and stumbling with subsequent striking against other object, initial encounter: Secondary | ICD-10-CM | POA: Insufficient documentation

## 2019-02-18 DIAGNOSIS — Z79899 Other long term (current) drug therapy: Secondary | ICD-10-CM | POA: Diagnosis not present

## 2019-02-18 DIAGNOSIS — O9989 Other specified diseases and conditions complicating pregnancy, childbirth and the puerperium: Secondary | ICD-10-CM | POA: Insufficient documentation

## 2019-02-18 DIAGNOSIS — Z3493 Encounter for supervision of normal pregnancy, unspecified, third trimester: Secondary | ICD-10-CM

## 2019-02-18 DIAGNOSIS — W19XXXA Unspecified fall, initial encounter: Secondary | ICD-10-CM

## 2019-02-18 DIAGNOSIS — R109 Unspecified abdominal pain: Secondary | ICD-10-CM | POA: Diagnosis not present

## 2019-02-18 DIAGNOSIS — Y9389 Activity, other specified: Secondary | ICD-10-CM | POA: Diagnosis not present

## 2019-02-18 DIAGNOSIS — R1084 Generalized abdominal pain: Secondary | ICD-10-CM | POA: Insufficient documentation

## 2019-02-18 DIAGNOSIS — Z3A00 Weeks of gestation of pregnancy not specified: Secondary | ICD-10-CM | POA: Diagnosis not present

## 2019-02-18 DIAGNOSIS — Y92091 Bathroom in other non-institutional residence as the place of occurrence of the external cause: Secondary | ICD-10-CM | POA: Insufficient documentation

## 2019-02-18 DIAGNOSIS — O26893 Other specified pregnancy related conditions, third trimester: Secondary | ICD-10-CM | POA: Diagnosis not present

## 2019-02-18 DIAGNOSIS — B009 Herpesviral infection, unspecified: Secondary | ICD-10-CM

## 2019-02-18 DIAGNOSIS — Y999 Unspecified external cause status: Secondary | ICD-10-CM | POA: Diagnosis not present

## 2019-02-18 NOTE — Discharge Instructions (Addendum)
1.  Follow-up with your obstetrician as planned. 2.  Return to the emergency department if you develop bleeding, vaginal fluid leak, recurrence of pain or other concerning symptoms.

## 2019-02-18 NOTE — ED Notes (Signed)
Spoke to [facility] they will be heading over here shortly.    FHT-150-160 via doppler.

## 2019-02-18 NOTE — ED Triage Notes (Signed)
Pt to ED. Reports she fell in the bathroom at home about 30 mins ago, landing on her stomach. Pt reports she can still feel baby move. She is 7 months pregnant, receiving prenatal care, Denies vaginal bleeding, vaginal discharge. Pt is anxious , crying.

## 2019-02-18 NOTE — ED Provider Notes (Signed)
[facility]-EMERGENCY DEPT Provider Note   CSN: [id number] Arrival date & time: 02/18/19  1731    History   Chief Complaint No chief complaint on file.   HPI Barbara Moore is a 28 y.o. female.     HPI She reports she was going to the bathroom and she stood up and lost her balance.  She reports she fell and did hit her abdomen.  She reports that she was pretty anxious and shaken up about it.  She reports that she did not have any bleeding or fluid loss.  She reports that she was having some cramping just before she went to the bathroom and thought that she was having [employer] contractions.  Reports after the fall though she was not having any contractions but she could still feel the baby moving. Past Medical History:  Diagnosis Date  . Herpes   . Lactose intolerance   . Obesity   . Seasonal allergies   . Sickle cell trait [facility])     Patient Active Problem List   Diagnosis Date Noted  . Abnormal Pap smear of cervix 11/11/2018  . BMI 45.0-49.9, adult (HCC) 10/28/2018  . Supervision of low-risk first pregnancy 10/23/2018  . 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 NO PAST SURGERIES       OB History    Gravida  1   Para      Term      Preterm      AB      Living        SAB      TAB      Ectopic      Multiple      Live Births               Home Medications    Prior to Admission medications   Medication Sig Start Date End Date Taking? Authorizing Provider  Prenatal Vit-Fe Fum-FA-Omega (PNV PRENATAL PLUS MULTIVIT+DHA PO) Take 1 capsule by mouth daily.   Yes [provider]    Family History Family History  Problem Relation Age of Onset  . Diabetes Mother   . Heart failure Mother   . Healthy Father   . Learning disabilities Sister     Social History Social History   Tobacco Use  . Smoking status: Never Smoker  . Smokeless tobacco: Never Used  Substance Use  Topics  . Alcohol use: Not Currently    Comment: occ  . Drug use: No     Allergies   Lactose intolerance (gi)   Review of Systems Review of Systems 10 Systems reviewed and are negative for acute change except as noted in the HPI.  Physical Exam Updated Vital Signs BP 132/84 (BP Location: Right Arm)   Pulse (!) 112   Temp 98.3 F (36.8 C) (Oral)   Resp 20   Ht 5\' 8"  (1.727 m)   Wt 136.1 kg   LMP 08/02/2018   SpO2 95%   BMI 45.61 kg/m   Physical Exam Constitutional:      Appearance: Normal appearance. She is well-developed.  HENT:     Head: Normocephalic and atraumatic.  Eyes:     Pupils: Pupils are equal, round, and reactive to light.  Neck:     Musculoskeletal: Neck supple.  Cardiovascular:     Rate and Rhythm: Normal rate and regular rhythm.     Heart sounds: Normal heart sounds.  Pulmonary:  Effort: Pulmonary effort is normal.     Breath sounds: Normal breath sounds.  Abdominal:     General: Bowel sounds are normal. There is no distension.     Palpations: Abdomen is soft.     Tenderness: There is no abdominal tenderness.     Comments: Gravid abdomen, nontender.  Musculoskeletal: Normal range of motion.  Skin:    General: Skin is warm and dry.  Neurological:     Mental Status: She is alert and oriented to person, place, and time.     GCS: GCS eye subscore is 4. GCS verbal subscore is 5. GCS motor subscore is 6.     Coordination: Coordination normal.  Psychiatric:        Mood and Affect: Mood normal.      ED Treatments / Results  Labs (all labs ordered are listed, but only abnormal results are displayed) Labs Reviewed - No data to display  EKG None  Radiology No results found.  Procedures Procedures (including critical care time)  Medications Ordered in ED Medications - No data to display   Initial Impression / Assessment and Plan / ED Course  I have reviewed the triage vital signs and the nursing notes.  Pertinent labs & imaging  results that were available during my care of the patient were reviewed by me and considered in my medical decision making (see chart for details).       No signs of traumatic injury.  Patient has been monitored with no contractions.  She is in no distress alert and appropriate.  Stable for discharge.  Final Clinical Impressions(s) / ED Diagnoses   Final diagnoses:  Fall, initial encounter  Third trimester pregnancy  Abdominal cramping    ED Discharge Orders    None       Arby BarrettePfeiffer, Shatavia Santor, MD 02/22/19 2323

## 2019-02-18 NOTE — Telephone Encounter (Signed)
Called the patient to inform of upcoming visit, left a detailed voicemail.

## 2019-02-18 NOTE — Progress Notes (Signed)
Here to evaluate this 28 yo G1P0 @ 28.4 wks status post fall on left side of abdomen from standing.  FHR Category I and appropriate for GA.Spoke with Dr. Debroah Loop of [facility]. Patient will need 4 hours fetal monitoring from time of fall.  EFM can be completed in ED or if not feasible he will accept transfer of the patient to [facility].

## 2019-02-18 NOTE — ED Notes (Signed)
This note also relates to the following rows which could not be included: Pulse Rate - Cannot attach notes to unvalidated device data SpO2 - Cannot attach notes to unvalidated device data  Called CNM to discuss FHR tracing. D/t IT issues [facility] to see FHR remotely. 4 hours post fall of monitoring is complete with a appropriate for gestation tracing. Cleared OB and may be discharged by ED.

## 2019-02-18 NOTE — ED Notes (Signed)
OB RN in room with pt . Pt is comfortable, denies pain at this time and offered food and drinks.

## 2019-02-19 ENCOUNTER — Ambulatory Visit (INDEPENDENT_AMBULATORY_CARE_PROVIDER_SITE_OTHER): Payer: BLUE CROSS/BLUE SHIELD | Admitting: Obstetrics and Gynecology

## 2019-02-19 ENCOUNTER — Encounter: Payer: Self-pay | Admitting: Obstetrics and Gynecology

## 2019-02-19 ENCOUNTER — Other Ambulatory Visit: Payer: Self-pay

## 2019-02-19 ENCOUNTER — Other Ambulatory Visit: Payer: BLUE CROSS/BLUE SHIELD

## 2019-02-19 VITALS — BP 121/79 | HR 103 | Temp 97.9°F | Wt 298.0 lb

## 2019-02-19 DIAGNOSIS — Z23 Encounter for immunization: Secondary | ICD-10-CM | POA: Diagnosis not present

## 2019-02-19 DIAGNOSIS — O98313 Other infections with a predominantly sexual mode of transmission complicating pregnancy, third trimester: Secondary | ICD-10-CM

## 2019-02-19 DIAGNOSIS — B009 Herpesviral infection, unspecified: Secondary | ICD-10-CM

## 2019-02-19 DIAGNOSIS — Z3A28 28 weeks gestation of pregnancy: Secondary | ICD-10-CM

## 2019-02-19 DIAGNOSIS — Z3400 Encounter for supervision of normal first pregnancy, unspecified trimester: Secondary | ICD-10-CM

## 2019-02-19 NOTE — Patient Instructions (Addendum)
[facility]- [phone] [address]  [place], [place] [address]    Third Trimester of Pregnancy The third trimester is from week 28 through week 40 (months 7 through 9). The third trimester is a time when the unborn baby (fetus) is growing rapidly. At the end of the ninth month, the fetus is about 20 inches in length and weighs 6-10 pounds. Body changes during your third trimester Your body will continue to go through many changes during pregnancy. The changes vary from woman to woman. During the third trimester:  Your weight will continue to increase. You can expect to gain 25-35 pounds (11-16 kg) by the end of the pregnancy.  You may begin to get stretch marks on your hips, abdomen, and breasts.  You may urinate more often because the fetus is moving lower into your pelvis and pressing on your bladder.  You may develop or continue to have heartburn. This is caused by increased hormones that slow down muscles in the digestive tract.  You may develop or continue to have constipation because increased hormones slow digestion and cause the muscles that push waste through your intestines to relax.  You may develop hemorrhoids. These are swollen veins (varicose veins) in the rectum that can itch or be painful.  You may develop swollen, bulging veins (varicose veins) in your legs.  You may have increased body aches in the pelvis, back, or thighs. This is due to weight gain and increased hormones that are relaxing your joints.  You may have changes in your hair. These can include thickening of your hair, rapid growth, and changes in texture. Some women also have hair loss during or after pregnancy, or hair that feels dry or thin. Your hair will most likely return to normal after your baby is born.  Your breasts will continue to grow and they will continue to become tender. A yellow fluid (colostrum) may leak from your breasts. This is the first milk you are producing for your  baby.  Your belly button may stick out.  You may notice more swelling in your hands, face, or ankles.  You may have increased tingling or numbness in your hands, arms, and legs. The skin on your belly may also feel numb.  You may feel short of breath because of your expanding uterus.  You may have more problems sleeping. This can be caused by the size of your belly, increased need to urinate, and an increase in your body's metabolism.  You may notice the fetus "dropping," or moving lower in your abdomen (lightening).  You may have increased vaginal discharge.  You may notice your joints feel loose and you may have pain around your pelvic bone. What to expect at prenatal visits You will have prenatal exams every 2 weeks until week 36. Then you will have weekly prenatal exams. During a routine prenatal visit:  You will be weighed to make sure you and the baby are growing normally.  Your blood pressure will be taken.  Your abdomen will be measured to track your baby's growth.  The fetal heartbeat will be listened to.  Any test results from the previous visit will be discussed.  You may have a cervical check near your due date to see if your cervix has softened or thinned (effaced).  You will be tested for Group B streptococcus. This happens between 35 and 37 weeks. Your health care provider may ask you:  What your birth plan is.  How you are feeling.  If you are feeling the baby move.  If you have had any abnormal symptoms, such as leaking fluid, bleeding, severe headaches, or abdominal cramping.  If you are using any tobacco products, including cigarettes, chewing tobacco, and electronic cigarettes.  If you have any questions. Other tests or screenings that may be performed during your third trimester include:  Blood tests that check for low iron levels (anemia).  Fetal testing to check the health, activity level, and growth of the fetus. Testing is done if you have  certain medical conditions or if there are problems during the pregnancy.  Nonstress test (NST). This test checks the health of your baby to make sure there are no signs of problems, such as the baby not getting enough oxygen. During this test, a belt is placed around your belly. The baby is made to move, and its heart rate is monitored during movement. What is false labor? False labor is a condition in which you feel small, irregular tightenings of the muscles in the womb (contractions) that usually go away with rest, changing position, or drinking water. These are called Braxton Hicks contractions. Contractions may last for hours, days, or even weeks before true labor sets in. If contractions come at regular intervals, become more frequent, increase in intensity, or become painful, you should see your health care provider. What are the signs of labor?  Abdominal cramps.  Regular contractions that start at 10 minutes apart and become stronger and more frequent with time.  Contractions that start on the top of the uterus and spread down to the lower abdomen and back.  Increased pelvic pressure and dull back pain.  A watery or bloody mucus discharge that comes from the vagina.  Leaking of amniotic fluid. This is also known as your "water breaking." It could be a slow trickle or a gush. Let your health care provider know if it has a color or strange odor. If you have any of these signs, call your health care provider right away, even if it is before your due date. Follow these instructions at home: Medicines  Follow your health care provider's instructions regarding medicine use. Specific medicines may be either safe or unsafe to take during pregnancy.  Take a prenatal vitamin that contains at least 600 micrograms (mcg) of folic acid.  If you develop constipation, try taking a stool softener if your health care provider approves. Eating and drinking   Eat a balanced diet that includes  fresh fruits and vegetables, whole grains, good sources of protein such as meat, eggs, or tofu, and low-fat dairy. Your health care provider will help you determine the amount of weight gain that is right for you.  Avoid raw meat and uncooked cheese. These carry germs that can cause birth defects in the baby.  If you have low calcium intake from food, talk to your health care provider about whether you should take a daily calcium supplement.  Eat four or five small meals rather than three large meals a day.  Limit foods that are high in fat and processed sugars, such as fried and sweet foods.  To prevent constipation: ? Drink enough fluid to keep your urine clear or pale yellow. ? Eat foods that are high in fiber, such as fresh fruits and vegetables, whole grains, and beans. Activity  Exercise only as directed by your health care provider. Most women can continue their usual exercise routine during pregnancy. Try to exercise for 30 minutes at least 5 days a  week. Stop exercising if you experience uterine contractions.  Avoid heavy lifting.  Do not exercise in extreme heat or humidity, or at high altitudes.  Wear low-heel, comfortable shoes.  Practice good posture.  You may continue to have sex unless your health care provider tells you otherwise. Relieving pain and discomfort  Take frequent breaks and rest with your legs elevated if you have leg cramps or low back pain.  Take warm sitz baths to soothe any pain or discomfort caused by hemorrhoids. Use hemorrhoid cream if your health care provider approves.  Wear a good support bra to prevent discomfort from breast tenderness.  If you develop varicose veins: ? Wear support pantyhose or compression stockings as told by your healthcare provider. ? Elevate your feet for 15 minutes, 3-4 times a day. Prenatal care  Write down your questions. Take them to your prenatal visits.  Keep all your prenatal visits as told by your health  care provider. This is important. Safety  Wear your seat belt at all times when driving.  Make a list of emergency phone numbers, including numbers for family, friends, the hospital, and police and fire departments. General instructions  Avoid cat litter boxes and soil used by cats. These carry germs that can cause birth defects in the baby. If you have a cat, ask someone to clean the litter box for you.  Do not travel far distances unless it is absolutely necessary and only with the approval of your health care provider.  Do not use hot tubs, steam rooms, or saunas.  Do not drink alcohol.  Do not use any products that contain nicotine or tobacco, such as cigarettes and e-cigarettes. If you need help quitting, ask your health care provider.  Do not use any medicinal herbs or unprescribed drugs. These chemicals affect the formation and growth of the baby.  Do not douche or use tampons or scented sanitary pads.  Do not cross your legs for long periods of time.  To prepare for the arrival of your baby: ? Take prenatal classes to understand, practice, and ask questions about labor and delivery. ? Make a trial run to the hospital. ? Visit the hospital and tour the maternity area. ? Arrange for maternity or paternity leave through employers. ? Arrange for family and friends to take care of pets while you are in the hospital. ? Purchase a rear-facing car seat and make sure you know how to install it in your car. ? Pack your hospital bag. ? Prepare the baby's nursery. Make sure to remove all pillows and stuffed animals from the baby's crib to prevent suffocation.  Visit your dentist if you have not gone during your pregnancy. Use a soft toothbrush to brush your teeth and be gentle when you floss. Contact a health care provider if:  You are unsure if you are in labor or if your water has broken.  You become dizzy.  You have mild pelvic cramps, pelvic pressure, or nagging pain in your  abdominal area.  You have lower back pain.  You have persistent nausea, vomiting, or diarrhea.  You have an unusual or bad smelling vaginal discharge.  You have pain when you urinate. Get help right away if:  Your water breaks before 37 weeks.  You have regular contractions less than 5 minutes apart before 37 weeks.  You have a fever.  You are leaking fluid from your vagina.  You have spotting or bleeding from your vagina.  You have severe abdominal pain  or cramping.  You have rapid weight loss or weight gain.  You have shortness of breath with chest pain.  You notice sudden or extreme swelling of your face, hands, ankles, feet, or legs.  Your baby makes fewer than 10 movements in 2 hours.  You have severe headaches that do not go away when you take medicine.  You have vision changes. Summary  The third trimester is from week 28 through week 40, months 7 through 9. The third trimester is a time when the unborn baby (fetus) is growing rapidly.  During the third trimester, your discomfort may increase as you and your baby continue to gain weight. You may have abdominal, leg, and back pain, sleeping problems, and an increased need to urinate.  During the third trimester your breasts will keep growing and they will continue to become tender. A yellow fluid (colostrum) may leak from your breasts. This is the first milk you are producing for your baby.  False labor is a condition in which you feel small, irregular tightenings of the muscles in the womb (contractions) that eventually go away. These are called Braxton Hicks contractions. Contractions may last for hours, days, or even weeks before true labor sets in.  Signs of labor can include: abdominal cramps; regular contractions that start at 10 minutes apart and become stronger and more frequent with time; watery or bloody mucus discharge that comes from the vagina; increased pelvic pressure and dull back pain; and leaking of  amniotic fluid. This information is not intended to replace advice given to you by your health care provider. Make sure you discuss any questions you have with your health care provider. Document Released: 09/17/2001 Document Revised: 10/29/2016 Document Reviewed: 10/29/2016 Elsevier Interactive Patient Education  2019 [employer].

## 2019-02-19 NOTE — Progress Notes (Signed)
Subjective:  Barbara Moore is a 28 y.o. G1P0 at [redacted]w[redacted]d being seen today for ongoing prenatal care.  She is currently monitored for the following issues for this low-risk pregnancy and has Supervision of low-risk first pregnancy; HSV-2 infection; BMI 45.0-49.9, adult (HCC); and Abnormal Pap smear of cervix on their problem list.  Patient reports no complaints.  Contractions: Not present. Vag. Bleeding: None.  Movement: Present. Denies leaking of fluid.   The following portions of the patient's history were reviewed and updated as appropriate: allergies, current medications, past family history, past medical history, past social history, past surgical history and problem list. Problem list updated.  Objective:   Vitals:   02/19/19 0915  BP: 121/79  Pulse: (!) 103  Temp: 97.9 F (36.6 C)  Weight: 298 lb (135.2 kg)    Fetal Status: Fetal Heart Rate (bpm): 156   Movement: Present     General:  Alert, oriented and cooperative. Patient is in no acute distress.  Skin: Skin is warm and dry. No rash noted.   Cardiovascular: Normal heart rate noted  Respiratory: Normal respiratory effort, no problems with respiration noted  Abdomen: Soft, gravid, appropriate for gestational age. Pain/Pressure: Present     Pelvic:  Cervical exam deferred        Extremities: Normal range of motion.  Edema: Trace  Mental Status: Normal mood and affect. Normal behavior. Normal judgment and thought content.   Urinalysis:      Assessment and Plan:  Pregnancy: G1P0 at [redacted]w[redacted]d  1. Encounter for supervision of low-risk first pregnancy, antepartum Stable Glucola today - Tdap vaccine greater than or equal to 7yo IM  Colpo next visit d/t abnormal pap smear Information for [name] to have sickle cell testing provided   2. HSV-2 infection Tx at 36 weeks  Preterm labor symptoms and general obstetric precautions including but not limited to vaginal bleeding, contractions, leaking of fluid and fetal movement were reviewed  in detail with the patient. Please refer to After Visit Summary for other counseling recommendations.  Return in about 2 weeks (around 03/05/2019) for OB visit, face to face for colpo.   Hermina Staggers, MD

## 2019-02-20 LAB — GLUCOSE TOLERANCE, 2 HOURS W/ 1HR
Glucose, 1 hour: 115 mg/dL (ref 65–179)
Glucose, 2 hour: 82 mg/dL (ref 65–152)
Glucose, Fasting: 90 mg/dL (ref 65–91)

## 2019-02-20 LAB — CBC
Hematocrit: 31.9 % — ABNORMAL LOW (ref 34.0–46.6)
Hemoglobin: 11.4 g/dL (ref 11.1–15.9)
MCH: 29.3 pg (ref 26.6–33.0)
MCHC: 35.7 g/dL (ref 31.5–35.7)
MCV: 82 fL (ref 79–97)
Platelets: 337 10*3/uL (ref 150–450)
RBC: 3.89 x10E6/uL (ref 3.77–5.28)
RDW: 14.9 % (ref 11.7–15.4)
WBC: 8.1 10*3/uL (ref 3.4–10.8)

## 2019-02-20 LAB — RPR: RPR Ser Ql: NONREACTIVE

## 2019-02-20 LAB — HIV ANTIBODY (ROUTINE TESTING W REFLEX): HIV Screen 4th Generation wRfx: NONREACTIVE

## 2019-02-24 ENCOUNTER — Telehealth: Payer: Self-pay

## 2019-02-24 NOTE — Telephone Encounter (Signed)
Pt called on Nurse line today at 2:13p wanting test results, advised everything came back negative/normal.

## 2019-03-10 ENCOUNTER — Ambulatory Visit (INDEPENDENT_AMBULATORY_CARE_PROVIDER_SITE_OTHER): Payer: BC Managed Care – PPO | Admitting: Obstetrics and Gynecology

## 2019-03-10 ENCOUNTER — Other Ambulatory Visit: Payer: Self-pay

## 2019-03-10 VITALS — BP 129/83 | HR 112 | Temp 98.4°F | Wt 300.1 lb

## 2019-03-10 DIAGNOSIS — Z3400 Encounter for supervision of normal first pregnancy, unspecified trimester: Secondary | ICD-10-CM

## 2019-03-10 DIAGNOSIS — B009 Herpesviral infection, unspecified: Secondary | ICD-10-CM

## 2019-03-10 DIAGNOSIS — R87610 Atypical squamous cells of undetermined significance on cytologic smear of cervix (ASC-US): Secondary | ICD-10-CM

## 2019-03-10 DIAGNOSIS — O98513 Other viral diseases complicating pregnancy, third trimester: Secondary | ICD-10-CM

## 2019-03-10 NOTE — Progress Notes (Signed)
Prenatal Visit Note Date: 03/10/2019 Clinic: [facility]  Subjective:  Barbara Moore is a 28 y.o. G1P0 at [redacted]w[redacted]d being seen today for ongoing prenatal care.  She is currently monitored for the following issues for this high-risk pregnancy and has Supervision of low-risk first pregnancy; HSV-2 infection; BMI 45.0-49.9, adult (HCC); and Abnormal Pap smear of cervix on their problem list.  Patient reports no complaints.   Contractions: Not present. Vag. Bleeding: None.  Movement: Present. Denies leaking of fluid.   The following portions of the patient's history were reviewed and updated as appropriate: allergies, current medications, past family history, past medical history, past social history, past surgical history and problem list. Problem list updated.  Objective:   Vitals:   03/10/19 1400  BP: 129/83  Pulse: (!) 112  Temp: 98.4 F (36.9 C)  Weight: (!) 300 lb 1.6 oz (136.1 kg)    Fetal Status: Fetal Heart Rate (bpm): 158 Fundal Height: 32 cm Movement: Present     General:  Alert, oriented and cooperative. Patient is in no acute distress.  Skin: Skin is warm and dry. No rash noted.   Cardiovascular: Normal heart rate noted  Respiratory: Normal respiratory effort, no problems with respiration noted  Abdomen: Soft, gravid, appropriate for gestational age. Pain/Pressure: Present     Pelvic:  Cervical exam deferred        Extremities: Normal range of motion.  Edema: Trace  Mental Status: Normal mood and affect. Normal behavior. Normal judgment and thought content.   Urinalysis:      Assessment and Plan:  Pregnancy: G1P0 at [redacted]w[redacted]d  1. Atypical squamous cells of undetermined significance on cytologic smear of cervix (ASC-US) D/w pt need for colpo PP  2. Encounter for supervision of low-risk first pregnancy, antepartum Routine care  3. HSV-2 infection Start ppx 34-36wks  Preterm labor symptoms and general obstetric precautions including but not limited to  vaginal bleeding, contractions, leaking of fluid and fetal movement were reviewed in detail with the patient. Please refer to After Visit Summary for other counseling recommendations.  Return in about 2 weeks (around 03/24/2019) for 2-3wk rob (can be virtual).   Ravenna Bing, MD

## 2019-03-16 ENCOUNTER — Other Ambulatory Visit: Payer: Self-pay

## 2019-03-16 ENCOUNTER — Ambulatory Visit (HOSPITAL_COMMUNITY)
Admission: RE | Admit: 2019-03-16 | Discharge: 2019-03-16 | Disposition: A | Discharge status: Home / Self Care | Payer: BC Managed Care – PPO | Source: Ambulatory Visit | Attending: Maternal & Fetal Medicine | Admitting: Maternal & Fetal Medicine

## 2019-03-16 ENCOUNTER — Other Ambulatory Visit (HOSPITAL_COMMUNITY): Payer: Self-pay | Admitting: *Deleted

## 2019-03-16 DIAGNOSIS — Z362 Encounter for other antenatal screening follow-up: Secondary | ICD-10-CM

## 2019-03-16 DIAGNOSIS — Z3A32 32 weeks gestation of pregnancy: Secondary | ICD-10-CM

## 2019-03-16 DIAGNOSIS — O99212 Obesity complicating pregnancy, second trimester: Secondary | ICD-10-CM

## 2019-03-16 DIAGNOSIS — Z6841 Body Mass Index (BMI) 40.0 and over, adult: Secondary | ICD-10-CM

## 2019-03-16 DIAGNOSIS — O99210 Obesity complicating pregnancy, unspecified trimester: Secondary | ICD-10-CM | POA: Diagnosis not present

## 2019-03-23 ENCOUNTER — Telehealth: Payer: Self-pay

## 2019-03-23 NOTE — Telephone Encounter (Signed)
Called the patient to confirm the upcoming appointment. Left a detailed voicemail message of how to access the mychart virtual visit as well as our office number. Informed the patient of logging into the mychart 5-10 mins prior to the appointment time. The appointment can be accessed by tapping on the appointment time. A link will appear that states click here to begin virtual visit. Once you click the link the virtual visit will begin. °

## 2019-03-24 ENCOUNTER — Telehealth (INDEPENDENT_AMBULATORY_CARE_PROVIDER_SITE_OTHER): Payer: BC Managed Care – PPO | Admitting: Medical

## 2019-03-24 ENCOUNTER — Telehealth: Payer: Self-pay | Admitting: Medical

## 2019-03-24 ENCOUNTER — Other Ambulatory Visit: Payer: Self-pay

## 2019-03-24 ENCOUNTER — Encounter: Payer: Self-pay | Admitting: Medical

## 2019-03-24 VITALS — BP 99/68 | HR 88

## 2019-03-24 DIAGNOSIS — Z3400 Encounter for supervision of normal first pregnancy, unspecified trimester: Secondary | ICD-10-CM

## 2019-03-24 DIAGNOSIS — Z3A33 33 weeks gestation of pregnancy: Secondary | ICD-10-CM

## 2019-03-24 DIAGNOSIS — B009 Herpesviral infection, unspecified: Secondary | ICD-10-CM

## 2019-03-24 DIAGNOSIS — Z6841 Body Mass Index (BMI) 40.0 and over, adult: Secondary | ICD-10-CM

## 2019-03-24 DIAGNOSIS — R87610 Atypical squamous cells of undetermined significance on cytologic smear of cervix (ASC-US): Secondary | ICD-10-CM

## 2019-03-24 DIAGNOSIS — O98313 Other infections with a predominantly sexual mode of transmission complicating pregnancy, third trimester: Secondary | ICD-10-CM

## 2019-03-24 MED ORDER — VALACYCLOVIR HCL 500 MG PO TABS: 500.0000 mg | ORAL_TABLET | Freq: Every day | ORAL | 1 refills | Status: DC

## 2019-03-24 NOTE — Progress Notes (Signed)
I connected with Barbara Moore on 03/24/19 at  1:55 PM EDT by: MyChart and verified that I am speaking with the correct person using two identifiers.  Patient is located at home and provider is located at [facility].     The purpose of this virtual visit is to provide medical care while limiting exposure to the novel coronavirus. I discussed the limitations, risks, security and privacy concerns of performing an evaluation and management service by MyChart and the availability of in person appointments. I also discussed with the patient that there may be a patient responsible charge related to this service. By engaging in this virtual visit, you consent to the provision of healthcare.  Additionally, you authorize for your insurance to be billed for the services provided during this visit.  The patient expressed understanding and agreed to proceed.  The following staff members participated in the virtual visit:  Carver Fila, [place] VISIT NOTE  Subjective:  Barbara Moore is a 28 y.o. G1P0 at [redacted]w[redacted]d  for phone visit for ongoing prenatal care.  She is currently monitored for the following issues for this low-risk pregnancy and has Supervision of low-risk first pregnancy; HSV-2 infection; BMI 45.0-49.9, adult ([place]); and Abnormal Pap smear of cervix on their problem list.  Patient reports backache.  Contractions: Irritability. Vag. Bleeding: None.  Movement: Present. Denies leaking of fluid.   The following portions of the patient's history were reviewed and updated as appropriate: allergies, current medications, past family history, past medical history, past social history, past surgical history and problem list.   Objective:   Vitals:   03/24/19 1401  BP: 99/68  Pulse: 88   Self-Obtained  Fetal Status:     Movement: Present     Assessment and Plan:  Pregnancy: G1P0 at [redacted]w[redacted]d 1. Encounter for supervision of low-risk first pregnancy, antepartum - All questions answered - Follow-up US for growth  scheduled 04/13/19 - Last EFW 76 %tile - Intermittent GERD symptoms - TUMs and diet discussed   2. HSV-2 infection - Rx for Valtrex sent to patient's pharmacy to start at 36 weeks   3. BMI 45.0-49.9, adult [facility]) - Patient does not have access to a scale at home   4. Atypical squamous cells of undetermined significance on cytologic smear of cervix (ASC-US) - Needs PP colpo  Preterm labor symptoms and general obstetric precautions including but not limited to vaginal bleeding, contractions, leaking of fluid and fetal movement were reviewed in detail with the patient.  Return in about 19 days (around 04/12/2019) for LOB, In-Person. Will need GBS and GC/Chlamydia at that time.   Future Appointments  Date Time Provider [place]  04/13/2019  1:45 PM WH-MFC [place] 2 WH-MFCUS MFC-US     Time spent on virtual visit: 15 minutes  Kerry Hough, PA-C

## 2019-03-24 NOTE — Telephone Encounter (Signed)
Attempted to call patient with her next OB appoitnment on 7/7 @ 3:15 at our office. No answer, left detailed message with appointment information. Appointment reminder mailed

## 2019-03-24 NOTE — Progress Notes (Signed)
1:47- Called pt reg My Chart appt, no answer, left VM that I will call back closer to her scheduled time.

## 2019-03-24 NOTE — Patient Instructions (Signed)

## 2019-04-12 ENCOUNTER — Telehealth: Payer: Self-pay | Admitting: Obstetrics & Gynecology

## 2019-04-12 NOTE — Telephone Encounter (Signed)
Called the patient to pre-screen. Left a detailed voicemail informing if the patient is experiencing any flu-like symptoms such as fever, chills, cough, or shortness of breath and/or has been in contact with anyone who is suspected of/or confirmed to have COVID19 please call back to reschedule the appointment. Upon entering the office please wear a face mask, sanitize hands, and no visitors or children due to COVID19 restrictions. °

## 2019-04-13 ENCOUNTER — Other Ambulatory Visit: Payer: Self-pay

## 2019-04-13 ENCOUNTER — Encounter: Payer: Self-pay | Admitting: Medical

## 2019-04-13 ENCOUNTER — Ambulatory Visit (HOSPITAL_COMMUNITY)
Admission: RE | Admit: 2019-04-13 | Discharge: 2019-04-13 | Disposition: A | Discharge status: Home / Self Care | Payer: BC Managed Care – PPO | Source: Ambulatory Visit | Attending: Obstetrics and Gynecology | Admitting: Obstetrics and Gynecology

## 2019-04-13 ENCOUNTER — Ambulatory Visit (INDEPENDENT_AMBULATORY_CARE_PROVIDER_SITE_OTHER): Payer: BC Managed Care – PPO | Admitting: Medical

## 2019-04-13 VITALS — BP 112/74 | HR 96 | Temp 98.2°F | Wt 307.6 lb

## 2019-04-13 DIAGNOSIS — R87610 Atypical squamous cells of undetermined significance on cytologic smear of cervix (ASC-US): Secondary | ICD-10-CM

## 2019-04-13 DIAGNOSIS — Z3A36 36 weeks gestation of pregnancy: Secondary | ICD-10-CM | POA: Diagnosis not present

## 2019-04-13 DIAGNOSIS — Z3400 Encounter for supervision of normal first pregnancy, unspecified trimester: Secondary | ICD-10-CM

## 2019-04-13 DIAGNOSIS — O99213 Obesity complicating pregnancy, third trimester: Secondary | ICD-10-CM

## 2019-04-13 DIAGNOSIS — Z6841 Body Mass Index (BMI) 40.0 and over, adult: Secondary | ICD-10-CM | POA: Insufficient documentation

## 2019-04-13 DIAGNOSIS — Z362 Encounter for other antenatal screening follow-up: Secondary | ICD-10-CM

## 2019-04-13 DIAGNOSIS — B009 Herpesviral infection, unspecified: Secondary | ICD-10-CM

## 2019-04-13 DIAGNOSIS — Z113 Encounter for screening for infections with a predominantly sexual mode of transmission: Secondary | ICD-10-CM

## 2019-04-13 DIAGNOSIS — Z3403 Encounter for supervision of normal first pregnancy, third trimester: Secondary | ICD-10-CM

## 2019-04-13 NOTE — Progress Notes (Signed)
   PRENATAL VISIT NOTE  Subjective:  Barbara Moore is a 28 y.o. G1P0 at [redacted]w[redacted]d being seen today for ongoing prenatal care.  She is currently monitored for the following issues for this low-risk pregnancy and has Supervision of low-risk first pregnancy; HSV-2 infection; BMI 45.0-49.9, adult ([place]); and Abnormal Pap smear of cervix on their problem list.  Patient reports no complaints.  Contractions: Irregular. Vag. Bleeding: None.  Movement: Present. Denies leaking of fluid.   The following portions of the patient's history were reviewed and updated as appropriate: allergies, current medications, past family history, past medical history, past social history, past surgical history and problem list.   Objective:   Vitals:   04/13/19 1525  BP: 112/74  Pulse: 96  Temp: 98.2 F (36.8 C)  Weight: (!) 307 lb 9.6 oz (139.5 kg)    Fetal Status: Fetal Heart Rate (bpm): 151 Fundal Height: 39 cm Movement: Present     General:  Alert, oriented and cooperative. Patient is in no acute distress.  Skin: Skin is warm and dry. No rash noted.   Cardiovascular: Normal heart rate noted  Respiratory: Normal respiratory effort, no problems with respiration noted  Abdomen: Soft, gravid, appropriate for gestational age.  Pain/Pressure: Present     Pelvic: Cervical exam performed Dilation: 1 Effacement (%): 20 Station: Ballotable  Extremities: Normal range of motion.  Edema: Trace  Mental Status: Normal mood and affect. Normal behavior. Normal judgment and thought content.   Assessment and Plan:  Pregnancy: G1P0 at [redacted]w[redacted]d 1. Encounter for supervision of low-risk first pregnancy, antepartum - Culture, beta strep (group b only) - GC/Chlamydia probe amp ([place])not at [facility]  2. HSV-2 infection - Advised to start Valtrex ASAP  3. BMI 45.0-49.9, adult ([place])  4. Atypical squamous cells of undetermined significance on cytologic smear of cervix (ASC-US) - Plan for PP colposcopy   Preterm labor symptoms  and general obstetric precautions including but not limited to vaginal bleeding, contractions, leaking of fluid and fetal movement were reviewed in detail with the patient. Please refer to After Visit Summary for other counseling recommendations.   Return in about 2 weeks (around 04/27/2019) for LOB, Virtual.  No future appointments.  Kerry Hough, PA-C

## 2019-04-13 NOTE — Patient Instructions (Signed)
Fetal Movement Counts Patient Name: ________________________________________________ Patient Due Date: ____________________ What is a fetal movement count?  A fetal movement count is the number of times that you feel your baby move during a certain amount of time. This may also be called a fetal kick count. A fetal movement count is recommended for every pregnant woman. You may be asked to start counting fetal movements as early as week 28 of your pregnancy. Pay attention to when your baby is most active. You may notice your baby's sleep and wake cycles. You may also notice things that make your baby move more. You should do a fetal movement count:  When your baby is normally most active.  At the same time each day. A good time to count movements is while you are resting, after having something to eat and drink. How do I count fetal movements? 1. Find a quiet, comfortable area. Sit, or lie down on your side. 2. Write down the date, the start time and stop time, and the number of movements that you felt between those two times. Take this information with you to your health care visits. 3. For 2 hours, count kicks, flutters, swishes, rolls, and jabs. You should feel at least 10 movements during 2 hours. 4. You may stop counting after you have felt 10 movements. 5. If you do not feel 10 movements in 2 hours, have something to eat and drink. Then, keep resting and counting for 1 hour. If you feel at least 4 movements during that hour, you may stop counting. Contact a health care provider if:  You feel fewer than 4 movements in 2 hours.  Your baby is not moving like he or she usually does. Date: ____________ Start time: ____________ Stop time: ____________ Movements: ____________ Date: ____________ Start time: ____________ Stop time: ____________ Movements: ____________ Date: ____________ Start time: ____________ Stop time: ____________ Movements: ____________ Date: ____________ Start time:  ____________ Stop time: ____________ Movements: ____________ Date: ____________ Start time: ____________ Stop time: ____________ Movements: ____________ Date: ____________ Start time: ____________ Stop time: ____________ Movements: ____________ Date: ____________ Start time: ____________ Stop time: ____________ Movements: ____________ Date: ____________ Start time: ____________ Stop time: ____________ Movements: ____________ Date: ____________ Start time: ____________ Stop time: ____________ Movements: ____________ This information is not intended to replace advice given to you by your health care provider. Make sure you discuss any questions you have with your health care provider. Document Released: 10/23/2006 Document Revised: 10/13/2018 Document Reviewed: 11/02/2015 Elsevier Patient Education  2020 Elsevier Inc. Braxton Hicks Contractions Contractions of the uterus can occur throughout pregnancy, but they are not always a sign that you are in labor. You may have practice contractions called Braxton Hicks contractions. These false labor contractions are sometimes confused with true labor. What are Braxton Hicks contractions? Braxton Hicks contractions are tightening movements that occur in the muscles of the uterus before labor. Unlike true labor contractions, these contractions do not result in opening (dilation) and thinning of the cervix. Toward the end of pregnancy (32-34 weeks), Braxton Hicks contractions can happen more often and may become stronger. These contractions are sometimes difficult to tell apart from true labor because they can be very uncomfortable. You should not feel embarrassed if you go to the hospital with false labor. Sometimes, the only way to tell if you are in true labor is for your health care provider to look for changes in the cervix. The health care provider will do a physical exam and may monitor your contractions. If you   are not in true labor, the exam should show  that your cervix is not dilating and your water has not broken. If there are no other health problems associated with your pregnancy, it is completely safe for you to be sent home with false labor. You may continue to have Braxton Hicks contractions until you go into true labor. How to tell the difference between true labor and false labor True labor  Contractions last 30-70 seconds.  Contractions become very regular.  Discomfort is usually felt in the top of the uterus, and it spreads to the lower abdomen and low back.  Contractions do not go away with walking.  Contractions usually become more intense and increase in frequency.  The cervix dilates and gets thinner. False labor  Contractions are usually shorter and not as strong as true labor contractions.  Contractions are usually irregular.  Contractions are often felt in the front of the lower abdomen and in the groin.  Contractions may go away when you walk around or change positions while lying down.  Contractions get weaker and are shorter-lasting as time goes on.  The cervix usually does not dilate or become thin. Follow these instructions at home:   Take over-the-counter and prescription medicines only as told by your health care provider.  Keep up with your usual exercises and follow other instructions from your health care provider.  Eat and drink lightly if you think you are going into labor.  If Braxton Hicks contractions are making you uncomfortable: ? Change your position from lying down or resting to walking, or change from walking to resting. ? Sit and rest in a tub of warm water. ? Drink enough fluid to keep your urine pale yellow. Dehydration may cause these contractions. ? Do slow and deep breathing several times an hour.  Keep all follow-up prenatal visits as told by your health care provider. This is important. Contact a health care provider if:  You have a fever.  You have continuous pain in  your abdomen. Get help right away if:  Your contractions become stronger, more regular, and closer together.  You have fluid leaking or gushing from your vagina.  You pass blood-tinged mucus (bloody show).  You have bleeding from your vagina.  You have low back pain that you never had before.  You feel your baby's head pushing down and causing pelvic pressure.  Your baby is not moving inside you as much as it used to. Summary  Contractions that occur before labor are called Braxton Hicks contractions, false labor, or practice contractions.  Braxton Hicks contractions are usually shorter, weaker, farther apart, and less regular than true labor contractions. True labor contractions usually become progressively stronger and regular, and they become more frequent.  Manage discomfort from Braxton Hicks contractions by changing position, resting in a warm bath, drinking plenty of water, or practicing deep breathing. This information is not intended to replace advice given to you by your health care provider. Make sure you discuss any questions you have with your health care provider. Document Released: 02/06/2017 Document Revised: 09/05/2017 Document Reviewed: 02/06/2017 Elsevier Patient Education  2020 Elsevier Inc.  

## 2019-04-14 LAB — GC/CHLAMYDIA PROBE AMP (~~LOC~~) NOT AT ARMC
Chlamydia: NEGATIVE
Neisseria Gonorrhea: NEGATIVE

## 2019-04-16 LAB — CULTURE, BETA STREP (GROUP B ONLY): Strep Gp B Culture: POSITIVE — AB

## 2019-04-17 ENCOUNTER — Other Ambulatory Visit: Payer: Self-pay

## 2019-04-17 ENCOUNTER — Encounter (HOSPITAL_COMMUNITY): Payer: Self-pay | Admitting: *Deleted

## 2019-04-17 ENCOUNTER — Inpatient Hospital Stay (HOSPITAL_COMMUNITY)
Admission: AD | Admit: 2019-04-17 | Discharge: 2019-04-17 | Disposition: A | Discharge status: Home / Self Care | Payer: BC Managed Care – PPO | Attending: Obstetrics & Gynecology | Admitting: Obstetrics & Gynecology

## 2019-04-17 ENCOUNTER — Inpatient Hospital Stay (HOSPITAL_BASED_OUTPATIENT_CLINIC_OR_DEPARTMENT_OTHER): Payer: BC Managed Care – PPO

## 2019-04-17 DIAGNOSIS — Z3A36 36 weeks gestation of pregnancy: Secondary | ICD-10-CM | POA: Diagnosis not present

## 2019-04-17 DIAGNOSIS — O288 Other abnormal findings on antenatal screening of mother: Secondary | ICD-10-CM | POA: Diagnosis not present

## 2019-04-17 DIAGNOSIS — O368190 Decreased fetal movements, unspecified trimester, not applicable or unspecified: Secondary | ICD-10-CM

## 2019-04-17 DIAGNOSIS — O368130 Decreased fetal movements, third trimester, not applicable or unspecified: Secondary | ICD-10-CM

## 2019-04-17 DIAGNOSIS — Z3689 Encounter for other specified antenatal screening: Secondary | ICD-10-CM

## 2019-04-17 DIAGNOSIS — O99213 Obesity complicating pregnancy, third trimester: Secondary | ICD-10-CM | POA: Diagnosis not present

## 2019-04-17 LAB — URINALYSIS, ROUTINE W REFLEX MICROSCOPIC
Bilirubin Urine: NEGATIVE
Glucose, UA: NEGATIVE mg/dL
Hgb urine dipstick: NEGATIVE
Ketones, ur: NEGATIVE mg/dL
Leukocytes,Ua: NEGATIVE
Nitrite: NEGATIVE
Protein, ur: NEGATIVE mg/dL
Specific Gravity, Urine: 1.028 (ref 1.005–1.030)
pH: 6 (ref 5.0–8.0)

## 2019-04-17 NOTE — MAU Provider Note (Signed)
History     CSN: [id number]  Arrival date and time: 04/17/19 0155   First Provider Initiated Contact with Patient 04/17/19 0305      Chief Complaint  Patient presents with  . Contractions   HPI  Ms.  Barbara Moore is a 28 y.o. year old G1P0 female at 7152w6d weeks gestation who presents to MAU reporting UC's since 200 on 7/10 and DFM since 1500 on 7/10. She laid on her LT side about 0000 and started feeling normal FM. She also reports some LT arm pain & weakness on 7/9, "but none today." Her [name] massaged her arm and made it feel better. She denies any VB or LOF. She reports feeling some movement since laying on LT side earlier and also since being in MAU. [facility]  Past Medical History:  Diagnosis Date  . Herpes   . Lactose intolerance   . Obesity   . Seasonal allergies   . Sickle cell trait [facility](HCC)     Past Surgical History:  Procedure Laterality Date  . NO PAST SURGERIES      Family History  Problem Relation Age of Onset  . Diabetes Mother   . Heart failure Mother   . Healthy Father   . Learning disabilities Sister     Social History   Tobacco Use  . Smoking status: Never Smoker  . Smokeless tobacco: Never Used  Substance Use Topics  . Alcohol use: Not Currently    Comment: occ  . Drug use: No    Allergies:  Allergies  Allergen Reactions  . Lactose Intolerance (Gi) Diarrhea and Nausea And Vomiting    Medications Prior to Admission  Medication Sig Dispense Refill Last Dose  . Prenatal Vit-Fe Fum-FA-Omega (PNV PRENATAL PLUS MULTIVIT+DHA PO) Take 1 capsule by mouth daily.   04/16/2019 at Unknown time  . valACYclovir (VALTREX) 500 MG tablet Take 1 tablet (500 mg total) by mouth daily. 30 tablet 1 04/16/2019 at Unknown time    Review of Systems  Constitutional: Negative.   HENT: Negative.   Eyes: Negative.   Respiratory: Negative.   Cardiovascular: Negative.   Gastrointestinal: Negative.   Endocrine: Negative.   Genitourinary:  Positive for pelvic pain (UC's since 2000).  Musculoskeletal: Negative.   Skin: Negative.   Allergic/Immunologic: Negative.   Neurological: Negative for weakness (of LT arm, but none today).  Hematological: Negative.   Psychiatric/Behavioral: Negative.    Physical Exam   Temperature 98.3 F (36.8 C), resp. rate 20, height 5\' 8"  (1.727 m), weight (!) 140.6 kg, last menstrual period 08/02/2018.  Physical Exam  Nursing note and vitals reviewed. Constitutional: She is oriented to person, place, and time. She appears well-developed and well-nourished.  HENT:  Head: Normocephalic and atraumatic.  Eyes: Pupils are equal, round, and reactive to light.  Neck: Normal range of motion.  Cardiovascular: Normal rate.  Respiratory: Effort normal.  GI: Soft.  Musculoskeletal: Normal range of motion.  Neurological: She is alert and oriented to person, place, and time.  Skin: Skin is warm and dry.  Psychiatric: She has a normal mood and affect. Her behavior is normal. Judgment and thought content normal.   NST - FHR: 155 bpm / moderate variability / accels present / decels absent / TOCO: Occ UC with UI noted  MAU Course  Procedures  MDM CCUA CEFM OB MFM BPP -- 8/8 with subjectively low AFI  Results for orders placed or performed during the hospital encounter of 04/17/19 (from the past 24  hour(s))  Urinalysis, Routine w reflex microscopic     Status: None   Collection Time: 04/17/19  3:01 AM  Result Value Ref Range   Color, Urine YELLOW YELLOW   APPearance CLEAR CLEAR   Specific Gravity, Urine 1.028 1.005 - 1.030   pH 6.0 5.0 - 8.0   Glucose, UA NEGATIVE NEGATIVE mg/dL   Hgb urine dipstick NEGATIVE NEGATIVE   Bilirubin Urine NEGATIVE NEGATIVE   Ketones, ur NEGATIVE NEGATIVE mg/dL   Protein, ur NEGATIVE NEGATIVE mg/dL   Nitrite NEGATIVE NEGATIVE   Leukocytes,Ua NEGATIVE NEGATIVE  . Assessment and Plan  NST (non-stress test) reactive  - Reassurance given that since having BPP, baby  has looked much better on EFM - Patient also stated she feels better knowing he is moving better now  Decreased fetal movement  - Information provided on [facility]  - Discharge patient,  - Keep scheduled [facility] visit with [facility] on 7/21 or prn - Patient verbalized an understanding of the plan of care and agrees.     Laury Deep, MSN,CNM 04/17/2019, 3:05 AM

## 2019-04-17 NOTE — Progress Notes (Signed)
Rolitta Dawson CNM in earlier to discuss test results and d/c plan. Written and verbal d/c instructions given and understanding voiced. 

## 2019-04-17 NOTE — Discharge Instructions (Signed)
Biophysical Profile - your baby scored 8/8 (equal to 100%) A biophysical profile is a non-invasive test that may be done during pregnancy to check that your developing baby (fetus) and your placenta are healthy. Your health care provider may recommend a biophysical profile if your pregnancy is at a higher risk for certain problems. A biophysical profile is usually done during the last 3 months of pregnancy (third trimester). A biophysical profile combines two tests to check the health of your baby. In one test, you will have a device strapped to your belly to measure your babys heart rate. The other test involves using sound waves and a computer (ultrasound) to create an image of your baby inside your womb (uterus). Together, these tests tell your health care provider about the overall health of your baby. Tell a health care provider about:  Any allergies you have.  All medicines you are taking, including vitamins, herbs, eye drops, creams, and over-the-counter medicines.  Any medical conditions you have.  Any concerns you have about your pregnancy.  Any symptoms such as abdominal pain or contractions, nausea or vomiting, vaginal bleeding, leaking of amniotic fluid, decreased fetal movements, fever or infection, increased swelling, headaches, or visual disturbances.  How often you feel your baby move. What are the risks? There are no risks to you or your baby from a biophysical profile. What happens before the procedure? Ask your health care provider how to prepare.  You may need to drink fluids so that you have a full bladder for your ultrasound.  You may also need to eat before you arrive for the test. That makes your baby more active. What happens during the procedure?      You will lie on your back on an exam table.  Your blood pressure may be monitored during the procedure.  A belt will be placed around your belly. The belt has a sensor to measure your babys heart rate.  You  may have to wear another belt and sensor to measure any muscle movements (contractions) in your uterus. During the ultrasound, a health care provider or technician will gently roll a handheld device (transducer) over your belly. This device sends signals to a computer that creates images of your baby.  Five areas of your babys health and development will be checked during the biophysical profile: ? Heart rate. ? Breathing. ? Movement. ? Active muscle movement (muscle tone). ? The amount of fluid in your uterus (amniotic fluid). The procedure may vary among health care providers and hospitals. What happens after the procedure?  Your health care provider will discuss your results with you. The results of a biophysical profile are scored in a range of 0 to 10. Each area that is evaluated is given a score of 0 or 2 points. If you get a score of 6 or less, you may need further testing, or your baby may need to be delivered early. A score of 8 to 10 with normal amniotic fluid levels is considered normal.  Unless you need additional testing, you can go home right after the procedure and resume your normal activities. Summary  A biophysical profile is a non-invasive test that may be done during pregnancy to check that your developing baby (fetus) and your placenta are healthy.  A biophysical profile combines two tests: A test to measure your baby's heart rate and an ultrasound test to create an image of your baby in the womb.  Tell your health care provider about any concerns you  have about your pregnancy or any pregnancy-related symptoms.  If you get a score of 6 or less, you may need further testing, or your baby may need to be delivered early. A score of 8 to 10 with normal amniotic fluid levels is considered normal. This information is not intended to replace advice given to you by your health care provider. Make sure you discuss any questions you have with your health care provider. Document  Released: 09/13/2002 Document Revised: 01/13/2019 Document Reviewed: 10/25/2016 Elsevier Patient Education  2020 Elsevier Inc.   Fetal Movement Counts Patient Name: ________________________________________________ Patient Due Date: ____________________ What is a fetal movement count?  A fetal movement count is the number of times that you feel your baby move during a certain amount of time. This may also be called a fetal kick count. A fetal movement count is recommended for every pregnant woman. You may be asked to start counting fetal movements as early as week 28 of your pregnancy. Pay attention to when your baby is most active. You may notice your baby's sleep and wake cycles. You may also notice things that make your baby move more. You should do a fetal movement count:  When your baby is normally most active.  At the same time each day. A good time to count movements is while you are resting, after having something to eat and drink. How do I count fetal movements? 1. Find a quiet, comfortable area. Sit, or lie down on your side. 2. Write down the date, the start time and stop time, and the number of movements that you felt between those two times. Take this information with you to your health care visits. 3. For 2 hours, count kicks, flutters, swishes, rolls, and jabs. You should feel at least 10 movements during 2 hours. 4. You may stop counting after you have felt 10 movements. 5. If you do not feel 10 movements in 2 hours, have something to eat and drink. Then, keep resting and counting for 1 hour. If you feel at least 4 movements during that hour, you may stop counting. Contact a health care provider if:  You feel fewer than 4 movements in 2 hours.  Your baby is not moving like he or she usually does. Date: ____________ Start time: ____________ Stop time: ____________ Movements: ____________ Date: ____________ Start time: ____________ Stop time: ____________ Movements:  ____________ Date: ____________ Start time: ____________ Stop time: ____________ Movements: ____________ Date: ____________ Start time: ____________ Stop time: ____________ Movements: ____________ Date: ____________ Start time: ____________ Stop time: ____________ Movements: ____________ Date: ____________ Start time: ____________ Stop time: ____________ Movements: ____________ Date: ____________ Start time: ____________ Stop time: ____________ Movements: ____________ Date: ____________ Start time: ____________ Stop time: ____________ Movements: ____________ Date: ____________ Start time: ____________ Stop time: ____________ Movements: ____________ This information is not intended to replace advice given to you by your health care provider. Make sure you discuss any questions you have with your health care provider. Document Released: 10/23/2006 Document Revised: 10/13/2018 Document Reviewed: 11/02/2015 Elsevier Patient Education  2020 [employer]

## 2019-04-17 NOTE — MAU Note (Addendum)
To us

## 2019-04-17 NOTE — MAU Note (Signed)
Ctxs since 2000. Decreased FM since 1500. Layed down on side about an hour ago and felt FM like normal. Denies LOF or bleeding. Earlier felt some pain/weakness in L arm. THat is still there but not as noticeable now.

## 2019-04-27 ENCOUNTER — Encounter: Payer: Self-pay | Admitting: Medical

## 2019-04-27 ENCOUNTER — Telehealth (INDEPENDENT_AMBULATORY_CARE_PROVIDER_SITE_OTHER): Payer: BC Managed Care – PPO | Admitting: Medical

## 2019-04-27 ENCOUNTER — Other Ambulatory Visit: Payer: Self-pay

## 2019-04-27 VITALS — BP 122/78 | HR 81

## 2019-04-27 DIAGNOSIS — Z6841 Body Mass Index (BMI) 40.0 and over, adult: Secondary | ICD-10-CM

## 2019-04-27 DIAGNOSIS — Z3403 Encounter for supervision of normal first pregnancy, third trimester: Secondary | ICD-10-CM

## 2019-04-27 DIAGNOSIS — Z3A38 38 weeks gestation of pregnancy: Secondary | ICD-10-CM

## 2019-04-27 DIAGNOSIS — O99820 Streptococcus B carrier state complicating pregnancy: Secondary | ICD-10-CM | POA: Insufficient documentation

## 2019-04-27 DIAGNOSIS — B009 Herpesviral infection, unspecified: Secondary | ICD-10-CM

## 2019-04-27 DIAGNOSIS — R87610 Atypical squamous cells of undetermined significance on cytologic smear of cervix (ASC-US): Secondary | ICD-10-CM

## 2019-04-27 NOTE — Patient Instructions (Signed)
Fetal Movement Counts Patient Name: ________________________________________________ Patient Due Date: ____________________ What is a fetal movement count?  A fetal movement count is the number of times that you feel your baby move during a certain amount of time. This may also be called a fetal kick count. A fetal movement count is recommended for every pregnant woman. You may be asked to start counting fetal movements as early as week 28 of your pregnancy. Pay attention to when your baby is most active. You may notice your baby's sleep and wake cycles. You may also notice things that make your baby move more. You should do a fetal movement count:  When your baby is normally most active.  At the same time each day. A good time to count movements is while you are resting, after having something to eat and drink. How do I count fetal movements? 1. Find a quiet, comfortable area. Sit, or lie down on your side. 2. Write down the date, the start time and stop time, and the number of movements that you felt between those two times. Take this information with you to your health care visits. 3. For 2 hours, count kicks, flutters, swishes, rolls, and jabs. You should feel at least 10 movements during 2 hours. 4. You may stop counting after you have felt 10 movements. 5. If you do not feel 10 movements in 2 hours, have something to eat and drink. Then, keep resting and counting for 1 hour. If you feel at least 4 movements during that hour, you may stop counting. Contact a health care provider if:  You feel fewer than 4 movements in 2 hours.  Your baby is not moving like he or she usually does. Date: ____________ Start time: ____________ Stop time: ____________ Movements: ____________ Date: ____________ Start time: ____________ Stop time: ____________ Movements: ____________ Date: ____________ Start time: ____________ Stop time: ____________ Movements: ____________ Date: ____________ Start time:  ____________ Stop time: ____________ Movements: ____________ Date: ____________ Start time: ____________ Stop time: ____________ Movements: ____________ Date: ____________ Start time: ____________ Stop time: ____________ Movements: ____________ Date: ____________ Start time: ____________ Stop time: ____________ Movements: ____________ Date: ____________ Start time: ____________ Stop time: ____________ Movements: ____________ Date: ____________ Start time: ____________ Stop time: ____________ Movements: ____________ This information is not intended to replace advice given to you by your health care provider. Make sure you discuss any questions you have with your health care provider. Document Released: 10/23/2006 Document Revised: 10/13/2018 Document Reviewed: 11/02/2015 Elsevier Patient Education  2020 Elsevier Inc. Braxton Hicks Contractions Contractions of the uterus can occur throughout pregnancy, but they are not always a sign that you are in labor. You may have practice contractions called Braxton Hicks contractions. These false labor contractions are sometimes confused with true labor. What are Braxton Hicks contractions? Braxton Hicks contractions are tightening movements that occur in the muscles of the uterus before labor. Unlike true labor contractions, these contractions do not result in opening (dilation) and thinning of the cervix. Toward the end of pregnancy (32-34 weeks), Braxton Hicks contractions can happen more often and may become stronger. These contractions are sometimes difficult to tell apart from true labor because they can be very uncomfortable. You should not feel embarrassed if you go to the hospital with false labor. Sometimes, the only way to tell if you are in true labor is for your health care provider to look for changes in the cervix. The health care provider will do a physical exam and may monitor your contractions. If you   are not in true labor, the exam should show  that your cervix is not dilating and your water has not broken. If there are no other health problems associated with your pregnancy, it is completely safe for you to be sent home with false labor. You may continue to have Braxton Hicks contractions until you go into true labor. How to tell the difference between true labor and false labor True labor  Contractions last 30-70 seconds.  Contractions become very regular.  Discomfort is usually felt in the top of the uterus, and it spreads to the lower abdomen and low back.  Contractions do not go away with walking.  Contractions usually become more intense and increase in frequency.  The cervix dilates and gets thinner. False labor  Contractions are usually shorter and not as strong as true labor contractions.  Contractions are usually irregular.  Contractions are often felt in the front of the lower abdomen and in the groin.  Contractions may go away when you walk around or change positions while lying down.  Contractions get weaker and are shorter-lasting as time goes on.  The cervix usually does not dilate or become thin. Follow these instructions at home:   Take over-the-counter and prescription medicines only as told by your health care provider.  Keep up with your usual exercises and follow other instructions from your health care provider.  Eat and drink lightly if you think you are going into labor.  If Braxton Hicks contractions are making you uncomfortable: ? Change your position from lying down or resting to walking, or change from walking to resting. ? Sit and rest in a tub of warm water. ? Drink enough fluid to keep your urine pale yellow. Dehydration may cause these contractions. ? Do slow and deep breathing several times an hour.  Keep all follow-up prenatal visits as told by your health care provider. This is important. Contact a health care provider if:  You have a fever.  You have continuous pain in  your abdomen. Get help right away if:  Your contractions become stronger, more regular, and closer together.  You have fluid leaking or gushing from your vagina.  You pass blood-tinged mucus (bloody show).  You have bleeding from your vagina.  You have low back pain that you never had before.  You feel your baby's head pushing down and causing pelvic pressure.  Your baby is not moving inside you as much as it used to. Summary  Contractions that occur before labor are called Braxton Hicks contractions, false labor, or practice contractions.  Braxton Hicks contractions are usually shorter, weaker, farther apart, and less regular than true labor contractions. True labor contractions usually become progressively stronger and regular, and they become more frequent.  Manage discomfort from Braxton Hicks contractions by changing position, resting in a warm bath, drinking plenty of water, or practicing deep breathing. This information is not intended to replace advice given to you by your health care provider. Make sure you discuss any questions you have with your health care provider. Document Released: 02/06/2017 Document Revised: 09/05/2017 Document Reviewed: 02/06/2017 Elsevier Patient Education  2020 Elsevier Inc.  

## 2019-04-27 NOTE — Progress Notes (Signed)
1351-attempted to call pt to start virtual visit. LVM informing pt of attempt and encouraged pt to give the office a call.

## 2019-04-27 NOTE — Progress Notes (Signed)
I connected with Barbara Moore  on 04/27/19 at  1:55 PM EDT by: MyChart and verified that I am speaking with the correct person using two identifiers.  Patient is located at home and provider is located at [facility].     The purpose of this virtual visit is to provide medical care while limiting exposure to the novel coronavirus. I discussed the limitations, risks, security and privacy concerns of performing an evaluation and management service by MyChart and the availability of in person appointments. I also discussed with the patient that there may be a patient responsible charge related to this service. By engaging in this virtual visit, you consent to the provision of healthcare.  Additionally, you authorize for your insurance to be billed for the services provided during this visit.  The patient expressed understanding and agreed to proceed.  The following staff members participated in the virtual visit:  Bonnye Fava, RN    PRENATAL VISIT NOTE  Subjective:  Barbara Moore is a 28 y.o. G1P0 at 102w2d  for phone visit for ongoing prenatal care.  She is currently monitored for the following issues for this low-risk pregnancy and has Supervision of low-risk first pregnancy; HSV-2 infection; BMI 45.0-49.9, adult ([place]); Abnormal Pap smear of cervix; NST (non-stress test) reactive; and Group B streptococcal carriage complicating pregnancy on their problem list.  Patient reports no complaints.  Contractions: Irritability. Vag. Bleeding: None.  Movement: Present. Denies leaking of fluid.   The following portions of the patient's history were reviewed and updated as appropriate: allergies, current medications, past family history, past medical history, past social history, past surgical history and problem list.   Objective:   Vitals:   04/27/19 1405  BP: 122/78  Pulse: 81   Self-Obtained  Fetal Status:     Movement: Present     Assessment and Plan:  Pregnancy: G1P0 at [redacted]w[redacted]d 1. Encounter  for supervision of low-risk first pregnancy in third trimester - Doing well - All questions answered   2. HSV-2 infection - Taking Valtrex  3. Atypical squamous cells of undetermined significance on cytologic smear of cervix (ASC-US) - Needs PP colpo   4. BMI 45.0-49.9, adult ([place])  5. Group B streptococcal carriage complicating pregnancy - Treat in labor, patient informed   Term labor symptoms and general obstetric precautions including but not limited to vaginal bleeding, contractions, leaking of fluid and fetal movement were reviewed in detail with the patient.  Return in about 1 week (around 05/04/2019) for LOB, Virtual.  Future Appointments  Date Time Provider [place]  05/04/2019  2:15 PM Tresea Mall, CNM [facility]  05/10/2019  1:55 PM Tresea Mall, CNM [place]     Time spent on virtual visit: 15 minutes  Kerry Hough, PA-C

## 2019-04-30 ENCOUNTER — Inpatient Hospital Stay (HOSPITAL_COMMUNITY)
Admission: AD | Admit: 2019-04-30 | Discharge: 2019-05-04 | DRG: 806 | Disposition: A | Discharge status: Home / Self Care | Payer: BC Managed Care – PPO | Attending: Obstetrics and Gynecology | Admitting: Obstetrics and Gynecology

## 2019-04-30 ENCOUNTER — Encounter (HOSPITAL_COMMUNITY): Payer: Self-pay | Admitting: *Deleted

## 2019-04-30 ENCOUNTER — Other Ambulatory Visit: Payer: Self-pay

## 2019-04-30 DIAGNOSIS — O9832 Other infections with a predominantly sexual mode of transmission complicating childbirth: Secondary | ICD-10-CM | POA: Diagnosis not present

## 2019-04-30 DIAGNOSIS — O1414 Severe pre-eclampsia complicating childbirth: Secondary | ICD-10-CM | POA: Diagnosis not present

## 2019-04-30 DIAGNOSIS — Z6841 Body Mass Index (BMI) 40.0 and over, adult: Secondary | ICD-10-CM

## 2019-04-30 DIAGNOSIS — O9081 Anemia of the puerperium: Secondary | ICD-10-CM | POA: Diagnosis present

## 2019-04-30 DIAGNOSIS — O99214 Obesity complicating childbirth: Secondary | ICD-10-CM | POA: Diagnosis not present

## 2019-04-30 DIAGNOSIS — O99824 Streptococcus B carrier state complicating childbirth: Secondary | ICD-10-CM | POA: Diagnosis not present

## 2019-04-30 DIAGNOSIS — O1413 Severe pre-eclampsia, third trimester: Secondary | ICD-10-CM

## 2019-04-30 DIAGNOSIS — Z1159 Encounter for screening for other viral diseases: Secondary | ICD-10-CM

## 2019-04-30 DIAGNOSIS — R87619 Unspecified abnormal cytological findings in specimens from cervix uteri: Secondary | ICD-10-CM | POA: Diagnosis present

## 2019-04-30 DIAGNOSIS — D573 Sickle-cell trait: Secondary | ICD-10-CM | POA: Diagnosis not present

## 2019-04-30 DIAGNOSIS — A6000 Herpesviral infection of urogenital system, unspecified: Secondary | ICD-10-CM | POA: Diagnosis present

## 2019-04-30 DIAGNOSIS — D62 Acute posthemorrhagic anemia: Secondary | ICD-10-CM | POA: Diagnosis not present

## 2019-04-30 DIAGNOSIS — Z3A38 38 weeks gestation of pregnancy: Secondary | ICD-10-CM | POA: Diagnosis not present

## 2019-04-30 DIAGNOSIS — O99213 Obesity complicating pregnancy, third trimester: Secondary | ICD-10-CM

## 2019-04-30 DIAGNOSIS — Z3A39 39 weeks gestation of pregnancy: Secondary | ICD-10-CM | POA: Diagnosis not present

## 2019-04-30 DIAGNOSIS — O99820 Streptococcus B carrier state complicating pregnancy: Secondary | ICD-10-CM

## 2019-04-30 DIAGNOSIS — O114 Pre-existing hypertension with pre-eclampsia, complicating childbirth: Secondary | ICD-10-CM | POA: Diagnosis not present

## 2019-04-30 LAB — COMPREHENSIVE METABOLIC PANEL
ALT: 18 U/L (ref 0–44)
AST: 14 U/L — ABNORMAL LOW (ref 15–41)
Albumin: 2.6 g/dL — ABNORMAL LOW (ref 3.5–5.0)
Alkaline Phosphatase: 137 U/L — ABNORMAL HIGH (ref 38–126)
Anion gap: 8 (ref 5–15)
BUN: 8 mg/dL (ref 6–20)
CO2: 20 mmol/L — ABNORMAL LOW (ref 22–32)
Calcium: 9.1 mg/dL (ref 8.9–10.3)
Chloride: 110 mmol/L (ref 98–111)
Creatinine, Ser: 0.72 mg/dL (ref 0.44–1.00)
GFR calc Af Amer: >60 mL/min (ref >60)
GFR calc non Af Amer: >60 mL/min (ref >60)
Glucose, Bld: 90 mg/dL (ref 70–99)
Potassium: 4 mmol/L (ref 3.5–5.1)
Sodium: 138 mmol/L (ref 135–145)
Total Bilirubin: 0.5 mg/dL (ref 0.3–1.2)
Total Protein: 6.2 g/dL — ABNORMAL LOW (ref 6.5–8.1)

## 2019-04-30 LAB — URINALYSIS, ROUTINE W REFLEX MICROSCOPIC
Bacteria, UA: NONE SEEN
Bilirubin Urine: NEGATIVE
Glucose, UA: NEGATIVE mg/dL
Hgb urine dipstick: NEGATIVE
Ketones, ur: NEGATIVE mg/dL
Leukocytes,Ua: NEGATIVE
Nitrite: NEGATIVE
Protein, ur: 30 mg/dL — AB
Specific Gravity, Urine: 1.029 (ref 1.005–1.030)
pH: 5 (ref 5.0–8.0)

## 2019-04-30 LAB — CBC
HCT: 32.4 % — ABNORMAL LOW (ref 36.0–46.0)
Hemoglobin: 11.2 g/dL — ABNORMAL LOW (ref 12.0–15.0)
MCH: 28.1 pg (ref 26.0–34.0)
MCHC: 34.6 g/dL (ref 30.0–36.0)
MCV: 81.2 fL (ref 80.0–100.0)
Platelets: 296 10*3/uL (ref 150–400)
RBC: 3.99 MIL/uL (ref 3.87–5.11)
RDW: 15.3 % (ref 11.5–15.5)
WBC: 8 10*3/uL (ref 4.0–10.5)
nRBC: 0 % (ref 0.0–0.2)

## 2019-04-30 LAB — PROTEIN / CREATININE RATIO, URINE
Creatinine, Urine: 285.52 mg/dL
Protein Creatinine Ratio: 0.07 mg/mg{Cre} (ref 0.00–0.15)
Total Protein, Urine: 19 mg/dL

## 2019-04-30 NOTE — MAU Provider Note (Signed)
Chief Complaint:  No chief complaint on file.   First Provider Initiated Contact with Patient 04/30/19 2207      HPI: Barbara Moore is a 28 y.o. G1P0 at 4257w5d by LMP who presents to maternity admissions reporting swelling and pain in her right leg, swelling in both hands and an elevated blood pressure tonight at home.  She has no hx of HTN before today.  She denies h/a, epigastric pain, or visual disturbances on arrival to MAU but while in MAU reports onset of occipital and frontal h/a and visual changes with dark spots in field of vision.  She has not tried any treatments.  Standing or weight bearing on her right leg makes the pain worse. She reports pelvic pressure but denies any abdominal pain.   HPI  Past Medical History: Past Medical History:  Diagnosis Date  . Herpes   . Lactose intolerance   . Obesity   . Seasonal allergies   . Sickle cell trait (HCC)     Past obstetric history: OB History  Gravida Para Term Preterm AB Living  1            SAB TAB Ectopic Multiple Live Births               # Outcome Date GA Lbr Len/2nd Weight Sex Delivery Anes PTL Lv  1 Current             Past Surgical History: Past Surgical History:  Procedure Laterality Date  . NO PAST SURGERIES      Family History: Family History  Problem Relation Age of Onset  . Diabetes Mother   . Heart failure Mother   . Healthy Father   . Learning disabilities Sister     Social History: Social History   Tobacco Use  . Smoking status: Never Smoker  . Smokeless tobacco: Never Used  Substance Use Topics  . Alcohol use: Not Currently    Comment: occ  . Drug use: No    Allergies:  Allergies  Allergen Reactions  . Lactose Intolerance (Gi) Diarrhea and Nausea And Vomiting    Meds:  Medications Prior to Admission  Medication Sig Dispense Refill Last Dose  . Prenatal Vit-Fe Fum-FA-Omega (PNV PRENATAL PLUS MULTIVIT+DHA PO) Take 1 capsule by mouth daily.   04/30/2019 at Unknown time  .  valACYclovir (VALTREX) 500 MG tablet Take 1 tablet (500 mg total) by mouth daily. 30 tablet 1 04/30/2019 at Unknown time    ROS:  Review of Systems  Constitutional: Negative for chills, fatigue and fever.  Eyes: Positive for visual disturbance.  Respiratory: Negative for shortness of breath.   Cardiovascular: Negative for chest pain.  Gastrointestinal: Negative for abdominal pain, nausea and vomiting.  Genitourinary: Positive for pelvic pain. Negative for difficulty urinating, dysuria, flank pain, vaginal bleeding, vaginal discharge and vaginal pain.  Neurological: Positive for headaches. Negative for dizziness.  Psychiatric/Behavioral: Negative.      I have reviewed patient's Past Medical Hx, Surgical Hx, Family Hx, Social Hx, medications and allergies.   Physical Exam   Patient Vitals for the past 24 hrs:  BP Temp Temp src Pulse Resp SpO2 Height Weight  05/01/19 0500 137/83 - - 96 18 100 % - -  05/01/19 0445 132/81 - - 94 20 99 % - -  05/01/19 0436 133/74 - - 90 20 100 % - -  05/01/19 0401 134/81 99.3 F (37.4 C) Oral 99 18 - - -  05/01/19 0215 121/78 - - 93 - - - -  05/01/19 0200 122/71 - - 97 - - - -  05/01/19 0145 125/72 - - 90 - - - -  05/01/19 0130 118/66 - - 89 - - - -  05/01/19 0115 127/79 - - 96 - - - -  05/01/19 0100 128/81 - - 92 - - - -  05/01/19 0045 131/84 - - 95 - - - -  05/01/19 0037 (!) 134/92 - - (!) 102 - - - -  05/01/19 0015 (!) 145/87 - - (!) 102 - - - -  05/01/19 0000 140/87 - - (!) 102 - - - -  04/30/19 2345 132/88 - - 95 - - - -  04/30/19 2330 123/77 - - 92 - - - -  04/30/19 2316 122/79 - - 91 - - - -  04/30/19 2300 140/88 - - 93 - - - -  04/30/19 2231 132/88 - - (!) 103 - - - -  04/30/19 2215 (!) 143/98 - - (!) 110 - - - -  04/30/19 2200 131/86 - - (!) 107 - - - -  04/30/19 2153 133/83 - - (!) 108 - - - -  04/30/19 2123 129/83 - - (!) 104 - - - -  04/30/19 2121 - 99.2 F (37.3 C) - - 18 - - -  04/30/19 2117 - - - - 18 - 5\' 8"  (1.727 m) (!)  147.4 kg   Constitutional: Well-developed, well-nourished female in no acute distress.  Cardiovascular: normal rate Respiratory: normal effort GI: Abd soft, non-tender, gravid appropriate for gestational age.  MS: Extremities nontender, 1+ nonpitting edema BLE, BUE,  Left calf 19 inches, right calf 19 inches, left ankle 10 inches, right ankle 10 inches, no warmth or redness of right or left leg, normal ROM Neurologic: Alert and oriented x 4.  GU: Neg CVAT.   Dilation:1 Effacement (%): 50 Cervical Position: Mid position Station: -3 Presentation: Vertex Exam by::Lisa Leftwich-Kirby, CNM  FHT:  Baseline 135 , moderate variability, accelerations present, no decelerations Contractions: irregular, mild to palpation   Labs: Results for orders placed or performed during the hospital encounter of 04/30/19 (from the past 24 hour(s))  Urinalysis, Routine w reflex microscopic     Status: Abnormal   Collection Time: 04/30/19  9:30 PM  Result Value Ref Range   Color, Urine YELLOW YELLOW   APPearance CLEAR CLEAR   Specific Gravity, Urine 1.029 1.005 - 1.030   pH 5.0 5.0 - 8.0   Glucose, UA NEGATIVE NEGATIVE mg/dL   Hgb urine dipstick NEGATIVE NEGATIVE   Bilirubin Urine NEGATIVE NEGATIVE   Ketones, ur NEGATIVE NEGATIVE mg/dL   Protein, ur 30 (A) NEGATIVE mg/dL   Nitrite NEGATIVE NEGATIVE   Leukocytes,Ua NEGATIVE NEGATIVE   RBC / HPF 0-5 0 - 5 RBC/hpf   WBC, UA 0-5 0 - 5 WBC/hpf   Bacteria, UA NONE SEEN NONE SEEN   Squamous Epithelial / LPF 0-5 0 - 5   Mucus PRESENT   Protein / creatinine ratio, urine     Status: None   Collection Time: 04/30/19  9:30 PM  Result Value Ref Range   Creatinine, Urine 285.52 mg/dL   Total Protein, Urine 19 mg/dL   Protein Creatinine Ratio 0.07 0.00 - 0.15 mg/mg[Cre]  CBC     Status: Abnormal   Collection Time: 04/30/19 10:35 PM  Result Value Ref Range   WBC 8.0 4.0 - 10.5 K/uL   RBC 3.99 3.87 - 5.11 MIL/uL   Hemoglobin 11.2 (L) 12.0 - 15.0 g/dL  HCT  32.4 (L) 36.0 - 46.0 %   MCV 81.2 80.0 - 100.0 fL   MCH 28.1 26.0 - 34.0 pg   MCHC 34.6 30.0 - 36.0 g/dL   RDW 96.015.3 45.411.5 - 09.815.5 %   Platelets 296 150 - 400 K/uL   nRBC 0.0 0.0 - 0.2 %  Comprehensive metabolic panel     Status: Abnormal   Collection Time: 04/30/19 10:35 PM  Result Value Ref Range   Sodium 138 135 - 145 mmol/L   Potassium 4.0 3.5 - 5.1 mmol/L   Chloride 110 98 - 111 mmol/L   CO2 20 (L) 22 - 32 mmol/L   Glucose, Bld 90 70 - 99 mg/dL   BUN 8 6 - 20 mg/dL   Creatinine, Ser 1.190.72 0.44 - 1.00 mg/dL   Calcium 9.1 8.9 - 14.710.3 mg/dL   Total Protein 6.2 (L) 6.5 - 8.1 g/dL   Albumin 2.6 (L) 3.5 - 5.0 g/dL   AST 14 (L) 15 - 41 U/L   ALT 18 0 - 44 U/L   Alkaline Phosphatase 137 (H) 38 - 126 U/L   Total Bilirubin 0.5 0.3 - 1.2 mg/dL   GFR calc non Af Amer >60 >60 mL/min   GFR calc Af Amer >60 >60 mL/min   Anion gap 8 5 - 15  Type and screen [facility]     Status: None   Collection Time: 05/01/19  3:10 AM  Result Value Ref Range   ABO/RH(D) A POS    Antibody Screen NEG    Sample Expiration      05/04/2019,2359 Performed at [facility], [address] [place], [place] [address]   ABO/Rh     Status: None (Preliminary result)   Collection Time: 05/01/19  3:10 AM  Result Value Ref Range   ABO/RH(D)      A POS Performed at [facility], [address] [place], [place] [address]   SARS Coronavirus 2 (CEPHEID - Performed in [facility] hospital lab), Hosp Order     Status: None   Collection Time: 05/01/19  3:22 AM   Specimen: Nasopharyngeal Swab  Result Value Ref Range   SARS Coronavirus 2 NEGATIVE NEGATIVE   --/--/A POS, A POS Performed at [facility], [address] [place], [place] [address]  [phone](07/25 0310)  Imaging:    MAU Course/MDM: Orders Placed This Encounter  Procedures  . SARS Coronavirus 2 (CEPHEID - Performed in [facility] hospital lab), [facility]  . Urinalysis, Routine w reflex microscopic  . CBC  .  Comprehensive metabolic panel  . Protein / creatinine ratio, urine  . RPR  . CBC  . Diet clear liquid Room service appropriate? Yes; Fluid consistency: Thin  . Vitals signs per unit policy  . Notify physician for vaginal bleeding, acute abdominal pain, temperature >/= 100.4 F, significant change in vital signs, non-reassuring fetal heart rate pattern, imminent delivery or failure to progress  . Fetal monitoring per unit policy  . Activity as tolerated  . Cervical Exam  . Measure blood pressure post delivery every 15 min x 1 hour then every 30 min x 1 hour  . Fundal check post delivery every 15 min x 1 hour then every 30 min x 1 hour  . If Rapid HIV test positive or known HIV positive: initiate AZT orders  . May in and out cath x 2 for inability to void  . Insert foley catheter  . Discontinue  foley prior to vaginal delivery  . Initiate Carrier Fluid Protocol  . Initiate Oral Care Protocol  . Order Rapid HIV per protocol if no results on chart  . Full code  . Type and screen [facility]  . ABO/Rh  . Insert and maintain IV Line  . Admit to Inpatient (patient's expected length of stay will be greater than 2 midnights or inpatient only procedure)    Meds ordered this encounter  Medications  . valACYclovir (VALTREX) tablet 500 mg  . lactated ringers infusion  . oxytocin (PITOCIN) IV BOLUS FROM BAG  . oxytocin (PITOCIN) IV infusion 40 units in NS 1000 mL - Premix  . lactated ringers infusion 500-1,000 mL  . acetaminophen (TYLENOL) tablet 650 mg  . oxyCODONE-acetaminophen (PERCOCET/ROXICET) 5-325 MG per tablet 1 tablet  . oxyCODONE-acetaminophen (PERCOCET/ROXICET) 5-325 MG per tablet 2 tablet  . ondansetron (ZOFRAN) injection 4 mg  . sodium citrate-citric acid (ORACIT) solution 30 mL  . lidocaine (PF) (XYLOCAINE) 1 % injection 30 mL  . FOLLOWED BY Linked Order Group   . penicillin G potassium 5 Million Units in sodium chloride 0.9 % 250 mL IVPB     Order Specific  Question:   Antibiotic Indication:     Answer:   Group B Strep Prophylaxis   . penicillin G 3 million units in sodium chloride 0.9% 100 mL IVPB     Order Specific Question:   Antibiotic Indication:     Answer:   Group B Strep Prophylaxis  . magnesium bolus via infusion 4 g  . magnesium sulfate 40 grams in LR 500 mL OB infusion  . misoprostol (CYTOTEC) tablet 50 mcg     NST reviewed and reactive Pt with positive Homans, no other s/sx of DVT Pt with elevated BP x 3-4 in MAU, > 6 hours from home BP that was elevated While in MAU pt developed h/a and visual changes Consult Dr Emelda FearFerguson with presentation, exam findings and test results.  Admit for IOL for Preeclampsia with severe features based on h/a and vision changes Mag sulfate PCN for GBS positive No HSV lesions on visual exam, no symptoms per pt. Pt taking suppressive Valtrex dose daily.    Assessment: 1. Preeclampsia, severe, third trimester     Plan: Admit to L&D Mag sulfate 4 g bolus then 2 g /hour PCN for GBS positive Evaluate right leg pain PRN while pt inpatient No HSV lesions on visual exam, no symptoms per pt. Pt taking suppressive Valtrex dose daily.   Sharen CounterLisa Leftwich-Kirby Certified Nurse-Midwife 05/01/2019 5:25 AM

## 2019-04-30 NOTE — MAU Note (Signed)
No redness or tenderness noted R calf. Pt states leg is sore all over

## 2019-04-30 NOTE — MAU Note (Signed)
R leg swollen from groin to foot. Some pain in R thigh and lower leg. No redness in leg. R hand feels tight with swelling. B/P at home was 120/94. Occ h/a but "nothing too serious". No headache now. Denies vag bleeding or d/c

## 2019-05-01 ENCOUNTER — Encounter (HOSPITAL_COMMUNITY): Payer: Self-pay

## 2019-05-01 DIAGNOSIS — D62 Acute posthemorrhagic anemia: Secondary | ICD-10-CM | POA: Diagnosis not present

## 2019-05-01 DIAGNOSIS — O1414 Severe pre-eclampsia complicating childbirth: Secondary | ICD-10-CM | POA: Diagnosis present

## 2019-05-01 DIAGNOSIS — Z1159 Encounter for screening for other viral diseases: Secondary | ICD-10-CM | POA: Diagnosis not present

## 2019-05-01 DIAGNOSIS — O99824 Streptococcus B carrier state complicating childbirth: Secondary | ICD-10-CM | POA: Diagnosis present

## 2019-05-01 DIAGNOSIS — Z3A38 38 weeks gestation of pregnancy: Secondary | ICD-10-CM | POA: Diagnosis not present

## 2019-05-01 DIAGNOSIS — O9081 Anemia of the puerperium: Secondary | ICD-10-CM | POA: Diagnosis not present

## 2019-05-01 DIAGNOSIS — D573 Sickle-cell trait: Secondary | ICD-10-CM | POA: Diagnosis present

## 2019-05-01 DIAGNOSIS — O9832 Other infections with a predominantly sexual mode of transmission complicating childbirth: Secondary | ICD-10-CM | POA: Diagnosis present

## 2019-05-01 DIAGNOSIS — O99214 Obesity complicating childbirth: Secondary | ICD-10-CM | POA: Diagnosis present

## 2019-05-01 DIAGNOSIS — A6000 Herpesviral infection of urogenital system, unspecified: Secondary | ICD-10-CM | POA: Diagnosis present

## 2019-05-01 HISTORY — DX: Severe pre-eclampsia complicating childbirth: O14.14

## 2019-05-01 LAB — CBC
HCT: 31.3 % — ABNORMAL LOW (ref 36.0–46.0)
Hemoglobin: 10.8 g/dL — ABNORMAL LOW (ref 12.0–15.0)
MCH: 28.2 pg (ref 26.0–34.0)
MCHC: 34.5 g/dL (ref 30.0–36.0)
MCV: 81.7 fL (ref 80.0–100.0)
Platelets: 295 10*3/uL (ref 150–400)
RBC: 3.83 MIL/uL — ABNORMAL LOW (ref 3.87–5.11)
RDW: 15.2 % (ref 11.5–15.5)
WBC: 8.9 10*3/uL (ref 4.0–10.5)
nRBC: 0 % (ref 0.0–0.2)

## 2019-05-01 LAB — SARS CORONAVIRUS 2 BY RT PCR (HOSPITAL ORDER, PERFORMED IN ~~LOC~~ HOSPITAL LAB): SARS Coronavirus 2: NEGATIVE

## 2019-05-01 LAB — RPR: RPR Ser Ql: NONREACTIVE

## 2019-05-01 MED ORDER — OXYCODONE-ACETAMINOPHEN 5-325 MG PO TABS: 2.0000 | ORAL_TABLET | ORAL | Status: DC | PRN

## 2019-05-01 MED ORDER — SODIUM CHLORIDE 0.9 % IV SOLN
5.0000 10*6.[IU] | Freq: Once | INTRAVENOUS | Status: AC
  Administered 2019-05-01: 05:00:00 5 10*6.[IU] via INTRAVENOUS
  Filled 2019-05-01: qty 5

## 2019-05-01 MED ORDER — OXYTOCIN 40 UNITS IN NORMAL SALINE INFUSION - SIMPLE MED
2.5000 [IU]/h | INTRAVENOUS | Status: DC
  Administered 2019-05-02: 14:00:00 2.5 [IU]/h via INTRAVENOUS
  Filled 2019-05-01: qty 1000

## 2019-05-01 MED ORDER — OXYTOCIN BOLUS FROM INFUSION
500.0000 mL | Freq: Once | INTRAVENOUS | Status: AC
  Administered 2019-05-02: 14:00:00 500 mL via INTRAVENOUS

## 2019-05-01 MED ORDER — PENICILLIN G 3 MILLION UNITS IVPB - SIMPLE MED
3.0000 10*6.[IU] | INTRAVENOUS | Status: DC
  Administered 2019-05-01 (×3): 3 10*6.[IU] via INTRAVENOUS
  Filled 2019-05-01 (×3): qty 100

## 2019-05-01 MED ORDER — SOD CITRATE-CITRIC ACID 500-334 MG/5ML PO SOLN: 30.0000 mL | ORAL | Status: DC | PRN

## 2019-05-01 MED ORDER — VALACYCLOVIR HCL 500 MG PO TABS
500.0000 mg | ORAL_TABLET | Freq: Every day | ORAL | Status: DC
  Administered 2019-05-01: 500 mg via ORAL
  Filled 2019-05-01: qty 1

## 2019-05-01 MED ORDER — LIDOCAINE HCL (PF) 1 % IJ SOLN
30.0000 mL | INTRAMUSCULAR | Status: AC | PRN
  Administered 2019-05-02: 14:00:00 30 mL via SUBCUTANEOUS
  Filled 2019-05-01: qty 30

## 2019-05-01 MED ORDER — LACTATED RINGERS IV SOLN
INTRAVENOUS | Status: DC
  Administered 2019-05-01 – 2019-05-02 (×4): via INTRAVENOUS

## 2019-05-01 MED ORDER — OXYCODONE-ACETAMINOPHEN 5-325 MG PO TABS: 1.0000 | ORAL_TABLET | ORAL | Status: DC | PRN

## 2019-05-01 MED ORDER — OXYTOCIN 40 UNITS IN NORMAL SALINE INFUSION - SIMPLE MED
1.0000 m[IU]/min | INTRAVENOUS | Status: DC
  Administered 2019-05-01: 15:00:00 2 m[IU]/min via INTRAVENOUS

## 2019-05-01 MED ORDER — ACETAMINOPHEN 325 MG PO TABS: 650.0000 mg | ORAL_TABLET | ORAL | Status: DC | PRN

## 2019-05-01 MED ORDER — MAGNESIUM SULFATE BOLUS VIA INFUSION
4.0000 g | Freq: Once | INTRAVENOUS | Status: AC
  Administered 2019-05-01: 05:00:00 4 g via INTRAVENOUS
  Filled 2019-05-01: qty 500

## 2019-05-01 MED ORDER — PENICILLIN G 3 MILLION UNITS IVPB - SIMPLE MED: 3.0000 10*6.[IU] | INTRAVENOUS | Status: DC

## 2019-05-01 MED ORDER — MISOPROSTOL 50MCG HALF TABLET
50.0000 ug | ORAL_TABLET | Freq: Once | ORAL | Status: AC
  Administered 2019-05-01: 50 ug via ORAL
  Filled 2019-05-01: qty 1

## 2019-05-01 MED ORDER — ONDANSETRON HCL 4 MG/2ML IJ SOLN
4.0000 mg | Freq: Four times a day (QID) | INTRAMUSCULAR | Status: DC | PRN
  Administered 2019-05-02: 07:00:00 4 mg via INTRAVENOUS
  Filled 2019-05-01: qty 2

## 2019-05-01 MED ORDER — LACTATED RINGERS IV SOLN: 500.0000 mL | INTRAVENOUS | Status: DC | PRN

## 2019-05-01 MED ORDER — TERBUTALINE SULFATE 1 MG/ML IJ SOLN: 0.2500 mg | Freq: Once | INTRAMUSCULAR | Status: DC | PRN

## 2019-05-01 MED ORDER — MAGNESIUM SULFATE 40 G IN LACTATED RINGERS - SIMPLE
2.0000 g/h | INTRAVENOUS | Status: AC
  Administered 2019-05-01 – 2019-05-03 (×4): 2 g/h via INTRAVENOUS
  Filled 2019-05-01 (×4): qty 500

## 2019-05-01 MED ORDER — PENICILLIN G 3 MILLION UNITS IVPB - SIMPLE MED
3.0000 10*6.[IU] | INTRAVENOUS | Status: DC
  Administered 2019-05-01 – 2019-05-02 (×4): 3 10*6.[IU] via INTRAVENOUS
  Filled 2019-05-01 (×12): qty 100

## 2019-05-01 NOTE — Progress Notes (Signed)
Pt moves around in bed and along with habitus makes fetal monitoring difficult

## 2019-05-01 NOTE — Progress Notes (Signed)
Labor Progress Note Barbara Moore is a 28 y.o. G1P0 at [redacted]w[redacted]d presented for IOL for preeclampsia with severe features  S:  Patient resting  O:  BP (!) 123/100   Pulse 82   Temp 98.3 F (36.8 C) (Oral)   Resp 16   Ht 5\' 8"  (1.727 m)   Wt (!) 147.4 kg   LMP 08/02/2018   SpO2 100%   BMI 49.42 kg/m   Fetal Tracing:  Baseline: 130 Variability: moderate Accels: 15x15 Decels: none  Toco: none   CVE: Dilation: 2 Effacement (%): 60 Cervical Position: Middle Station: -3 Presentation: Vertex Exam by:: Maryruth Hancock, CNM   A&P: 28 y.o. G1P0 [redacted]w[redacted]d IOL for preeclampsia #Labor: Progressing well. Will start pitocin 2x2 #Pain: per patient request #FWB: Cat 1 #GBS positive  Wende Mott, CNM 2:28 PM

## 2019-05-01 NOTE — Progress Notes (Signed)
LABOR PROGRESS NOTE  Bhumi Godbey is a 28 y.o. G1P0 at [redacted]w[redacted]d admitted for IOL for preeclampsia with severe features.  Subjective: Doing well with no concerns at this time. Comfortable. Asymptomatic.  Objective: BP 98/61 Comment: BP cuff on top arm  Pulse 80   Temp 97.8 F (36.6 C)   Resp 18   Ht 5\' 8"  (1.727 m)   Wt (!) 147.4 kg   LMP 08/02/2018   SpO2 100%   BMI 49.42 kg/m  or  Vitals:   05/01/19 2000 05/01/19 2032 05/01/19 2100 05/01/19 2130  BP: 119/76 (!) 151/95 124/78 98/61  Pulse: 89 81 83 80  Resp: 20 20 20 18   Temp:      TempSrc:      SpO2:      Weight:      Height:       Dilation: 2 Effacement (%): 60 Cervical Position: Posterior Station: -3 Presentation: Vertex Exam by:: Dr Jearl Klinefelter: Baseline rate 130, minimal varibility, -accels, -decel Toco: Unable to assess as adjusting tocometer  Labs: Lab Results  Component Value Date   WBC 8.9 05/01/2019   HGB 10.8 (L) 05/01/2019   HCT 31.3 (L) 05/01/2019   MCV 81.7 05/01/2019   PLT 295 05/01/2019    Patient Active Problem List   Diagnosis Date Noted  . Preeclampsia, severe, third trimester 05/01/2019  . Group B streptococcal carriage complicating pregnancy 18/56/3149  . NST (non-stress test) reactive 04/17/2019  . Abnormal Pap smear of cervix 11/11/2018  . BMI 45.0-49.9, adult ([place]) 10/28/2018  . Supervision of low-risk first pregnancy 10/23/2018  . HSV-2 infection 10/23/2018    Assessment / Plan: 28 y.o. G1P0 at [redacted]w[redacted]d here for IOL for preeclampsia with severe features.  Pre-eclampsia with SF: On Mag and asymptomatic at this time; BP remains well-controlled, will continue to monitor. Labor: Unchanged from last cervical exam. Will place foley bulb at this time and continue Pitocin 2x2 per protocol. Fetal Wellbeing: Category 2 as above; will continue to monitor. Pain Control: None at this time Anticipated MOD: SVD  Vilma Meckel, MD Family Medicine, PGY-2 05/01/2019, 9:44 PM

## 2019-05-01 NOTE — Progress Notes (Signed)
To BS via w/c °

## 2019-05-01 NOTE — MAU Note (Signed)
Pt states she feels alittle better. STill has some floaters but head does feel better since rested head back. Has metalic taste

## 2019-05-01 NOTE — Progress Notes (Signed)
Barbara Moore is a 28 y.o. G1P0 at [redacted]w[redacted]d by ultrasound admitted for Preeclampsia with severe features based on headache and visual changes.  Subjective: Pt feeling some cramping, doing well, does not plan pain medication during labor but is open to it if needed.  Objective: BP 98/61 Comment: BP cuff on top arm  Pulse 80   Temp 97.8 F (36.6 C)   Resp 18   Ht 5\' 8"  (1.727 m)   Wt (!) 147.4 kg   LMP 08/02/2018   SpO2 100%   BMI 49.42 kg/m  I/O last 3 completed shifts: In: 3249.7 [P.O.:1005; I.V.:1694.7; IV Piggyback:550] Out: 2850 [Urine:2850] Total I/O In: 542.5 [P.O.:238; I.V.:304.5] Out: 450 [Urine:450]  FHT:  FHR: 120 bpm, variability: moderate,  accelerations:  Present,  decelerations:  Absent UC:   irregular, every 10-15 minutes SVE:   Dilation: 2.5 Effacement (%): 60 Station: -3 Exam by:: L. Leftwich-Kirby CNM Foley bulb found to be in vaginal vault, through cervix.  Cervix 2.5-3 cm, soft, thin on exam so likely foley came out of cervix before it was fully inflated so did stretch cervix some.  Foley replaced, 60 ml of NS to fill balloon by RN.  Pt tolerated well.   Labs: Lab Results  Component Value Date   WBC 8.9 05/01/2019   HGB 10.8 (L) 05/01/2019   HCT 31.3 (L) 05/01/2019   MCV 81.7 05/01/2019   PLT 295 05/01/2019    Assessment / Plan: Induction of labor due to preeclampsia,  progressing well on pitocin Foley bulb in place  Labor: Progressing normally Preeclampsia:  on magnesium sulfate Fetal Wellbeing:  Category I Pain Control:  Labor support without medications I/D:  GBS positive on PCN Anticipated MOD:  NSVD  [name] 05/01/2019, 9:56 PM

## 2019-05-01 NOTE — Progress Notes (Signed)
Labor Progress Note Barbara Moore is a 28 y.o. G1P0 at [redacted]w[redacted]d presented for IOL for preeclampsia with severe features  S:  Patient comfortable. Good cervical change after one dose of cytotec.  O:  BP 134/86   Pulse 80   Temp 98.2 F (36.8 C) (Oral)   Resp 18   Ht 5\' 8"  (1.727 m)   Wt (!) 147.4 kg   LMP 08/02/2018   SpO2 100%   BMI 49.42 kg/m   Fetal Tracing:  Baseline: 135 Variability: moderate Accels:15x15 Decels: none  Toco: occasional uc's   CVE: Dilation: 2 Effacement (%): 50 Cervical Position: Middle Station: -2 Presentation: Vertex Exam by:: Rosana Hoes, RN    A&P: 28 y.o. G1P0 [redacted]w[redacted]d IOL for preeclampsia with severe features #Labor: Progressing well. Discussed placement of FB with patient. Patient hesitant due to failed attempt overnight. Would prefer to do another dose of cytotec, reasonable considering good change made with first dose. #Pain: per patient request #FWB: Cat 1 #GBS positive  Wende Mott, CNM 10:13 AM

## 2019-05-01 NOTE — MAU Note (Signed)
Pt was sitting up in bed. WHen she laid back FHR and uterine activity started recording. LL Kirby CNM in to discuss admission and POC with pt

## 2019-05-01 NOTE — MAU Note (Signed)
Covid swab obtained without difficulty and pt tol well. No symptoms 

## 2019-05-01 NOTE — MAU Note (Signed)
Pt had just gotten up to BR. When returned called out and stated she has started seeing floaters and having some h/a in back of head. Clammy. Cool cloth to head and ice water given. Aquilla Hacker CNM notified.

## 2019-05-01 NOTE — H&P (Addendum)
LABOR AND DELIVERY ADMISSION HISTORY AND PHYSICAL NOTE  Barbara Moore is a 28 y.o. female G1P0 with IUP at [redacted]w[redacted]d by LMP presenting for IOL for pre-eclampsia with severe features.   She reports positive fetal movement. She denies leakage of fluid or vaginal bleeding. Patient states that her headache has resolved but is still complaining of some "floaters" in her vision. Pain is well-controlled at this time, endorsing some mild cramping only.  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 Herpes   . Lactose intolerance   . Obesity   . Seasonal allergies   . Sickle cell trait [facility])     Past Surgical History: Past Surgical History:  Procedure Laterality Date  . NO PAST SURGERIES      Obstetrical History: OB History    Gravida  1   Para      Term      Preterm      AB      Living        SAB      TAB      Ectopic      Multiple      Live Births              Social History: Social History   Socioeconomic History  . Marital status: Single    Spouse name: Not on file  . Number of children: Not on file  . Years of education: Not on file  . Highest education level: Not on file  Occupational History  . Occupation: Database administrator: [employer]  Social Needs  . Financial resource strain: Not on file  . Food insecurity    Worry: Never true    Inability: Sometimes true  . Transportation needs    Medical: No    Non-medical: No  Tobacco Use  . Smoking status: Never Smoker  . Smokeless tobacco: Never Used  Substance and Sexual Activity  . Alcohol use: Not Currently    Comment: occ  . Drug use: No  . Sexual activity: Yes    Birth control/protection: None  Lifestyle  . Physical activity    Days per week: Not on file    Minutes per session: Not on file  .  Stress: Not on file  Relationships  . Social Herbalist on phone: Not on file    Gets together: Not on file    Attends religious service: Not on file    Active member of club or organization: Not on file    Attends meetings of clubs or organizations: Not on file    Relationship status: Not on file  Other Topics Concern  . Not on file  Social History Narrative  . Not on file    Family History: Family History  Problem Relation Age of Onset  . Diabetes Mother   . Heart failure Mother   . Healthy Father   . Learning disabilities Sister     Allergies: Allergies  Allergen Reactions  . Lactose Intolerance (Gi) Diarrhea and Nausea And Vomiting    Medications Prior to Admission  Medication Sig Dispense Refill Last Dose  . Prenatal Vit-Fe Fum-FA-Omega (PNV PRENATAL PLUS MULTIVIT+DHA PO) Take 1 capsule by mouth daily.   04/30/2019 at Unknown time  . valACYclovir (VALTREX) 500 MG tablet Take  1 tablet (500 mg total) by mouth daily. 30 tablet 1 04/30/2019 at Unknown time     Review of Systems  All systems reviewed and negative except as stated in HPI  Physical Exam Blood pressure 132/81, pulse 94, temperature 99.3 F (37.4 C), temperature source Oral, resp. rate 20, height 5\' 8"  (1.727 m), weight (!) 147.4 kg, last menstrual period 08/02/2018, SpO2 99 %. General appearance: Alert, oriented, NAD Lungs: Normal respiratory effort Heart: Regular rate Abdomen: Soft, non-tender; gravid, FH appropriate for GA Extremities: No calf swelling or tenderness Presentation: Cephalic Fetal monitoring: Baseline rate 145, moderate variability, + accels, - decels Uterine activity: Occasional contractions Dilation: 1(external 1 internal closed ) Effacement (%): 50 Station: -3 Exam by:: Dr. Mathis FareAlbert   Prenatal labs: ABO, Rh: --/--/A POS, A POS Performed at [facility], [address] [place], [place] [address]  [id number](07/25 0310) Antibody: NEG (07/25 0310) Rubella: 1.57 (01/17  1209) RPR: Non Reactive (05/15 0834)  HBsAg: Negative (01/17 1209)  HIV: Non Reactive (05/15 0834)  GC/Chlamydia: Negative GBS:  Positive  2-hr GTT: 82 (WNL) Genetic screening: Normal Anatomy US: Normal growth; placenta anterior  Prenatal Transfer Tool  Maternal Diabetes: No Genetic Screening: Normal Maternal Ultrasounds/Referrals: Normal Fetal Ultrasounds or other Referrals:  None Maternal Substance Abuse:  No Significant Maternal Medications:  Valtrex, PNV Significant Maternal Lab Results: Group B Strep positive  Results for orders placed or performed during the hospital encounter of 04/30/19 (from the past 24 hour(s))  Urinalysis, Routine w reflex microscopic   Collection Time: 04/30/19  9:30 PM  Result Value Ref Range   Color, Urine YELLOW YELLOW   APPearance CLEAR CLEAR   Specific Gravity, Urine 1.029 1.005 - 1.030   pH 5.0 5.0 - 8.0   Glucose, UA NEGATIVE NEGATIVE mg/dL   Hgb urine dipstick NEGATIVE NEGATIVE   Bilirubin Urine NEGATIVE NEGATIVE   Ketones, ur NEGATIVE NEGATIVE mg/dL   Protein, ur 30 (A) NEGATIVE mg/dL   Nitrite NEGATIVE NEGATIVE   Leukocytes,Ua NEGATIVE NEGATIVE   RBC / HPF 0-5 0 - 5 RBC/hpf   WBC, UA 0-5 0 - 5 WBC/hpf   Bacteria, UA NONE SEEN NONE SEEN   Squamous Epithelial / LPF 0-5 0 - 5   Mucus PRESENT   Protein / creatinine ratio, urine   Collection Time: 04/30/19  9:30 PM  Result Value Ref Range   Creatinine, Urine 285.52 mg/dL   Total Protein, Urine 19 mg/dL   Protein Creatinine Ratio 0.07 0.00 - 0.15 mg/mg[Cre]  CBC   Collection Time: 04/30/19 10:35 PM  Result Value Ref Range   WBC 8.0 4.0 - 10.5 K/uL   RBC 3.99 3.87 - 5.11 MIL/uL   Hemoglobin 11.2 (L) 12.0 - 15.0 g/dL   HCT 40.932.4 (L) 81.136.0 - 91.446.0 %   MCV 81.2 80.0 - 100.0 fL   MCH 28.1 26.0 - 34.0 pg   MCHC 34.6 30.0 - 36.0 g/dL   RDW 78.215.3 95.611.5 - 21.315.5 %   Platelets 296 150 - 400 K/uL   nRBC 0.0 0.0 - 0.2 %  Comprehensive metabolic panel   Collection Time: 04/30/19 10:35 PM   Result Value Ref Range   Sodium 138 135 - 145 mmol/L   Potassium 4.0 3.5 - 5.1 mmol/L   Chloride 110 98 - 111 mmol/L   CO2 20 (L) 22 - 32 mmol/L   Glucose, Bld 90 70 - 99 mg/dL   BUN 8 6 - 20 mg/dL   Creatinine, Ser 0.860.72 0.44 -  1.00 mg/dL   Calcium 9.1 8.9 - 40.910.3 mg/dL   Total Protein 6.2 (L) 6.5 - 8.1 g/dL   Albumin 2.6 (L) 3.5 - 5.0 g/dL   AST 14 (L) 15 - 41 U/L   ALT 18 0 - 44 U/L   Alkaline Phosphatase 137 (H) 38 - 126 U/L   Total Bilirubin 0.5 0.3 - 1.2 mg/dL   GFR calc non Af Amer >60 >60 mL/min   GFR calc Af Amer >60 >60 mL/min   Anion gap 8 5 - 15  Type and screen [facility]   Collection Time: 05/01/19  3:10 AM  Result Value Ref Range   ABO/RH(D) A POS    Antibody Screen NEG    Sample Expiration      05/04/2019,2359 Performed at [facility], [address] [place], [place] [address]   ABO/Rh   Collection Time: 05/01/19  3:10 AM  Result Value Ref Range   ABO/RH(D)      A POS Performed at [facility], [address] [place], [place] [address]   SARS Coronavirus 2 (CEPHEID - Performed in [facility] hospital lab), [facility] Order   Collection Time: 05/01/19  3:22 AM   Specimen: Nasopharyngeal Swab  Result Value Ref Range   SARS Coronavirus 2 NEGATIVE NEGATIVE    Patient Active Problem List   Diagnosis Date Noted  . Preeclampsia, severe, third trimester 05/01/2019  . Group B streptococcal carriage complicating pregnancy 04/27/2019  . NST (non-stress test) reactive 04/17/2019  . Abnormal Pap smear of cervix 11/11/2018  . BMI 45.0-49.9, adult (HCC) 10/28/2018  . Supervision of low-risk first pregnancy 10/23/2018  . HSV-2 infection 10/23/2018    Assessment: Barbara Moore is a 28 y.o. G1P0 at [id number] here for IOL for pre-eclampsia with severe features.  #Pre-eclampsia with SF: On Mag; labs and BP within normal limits. Headache resolved and complaining of vision changes only; will continue to monitor. #Labor: Induction of labor with  Cytotec due to internal os being closed on exam. Will reassess in 3-4 hours and attempt foley bulb placement if able. #Pain: Planning to avoid epidural if possible #FWB: Category 1 tracing as above #ID: GBS positive; PCN ordered #MOF: Breast  #MOC: POPs #Circ: Desired  Worthy RancherChristina M Albert 05/01/2019, 4:47 AM  I confirm that I have verified the information documented in the resident's note and that I have also personally reperformed the history, physical exam and all medical decision making activities of this service and have verified that all service and findings are accurately documented in this resident's note.   Sharyon CableRogers, Zeena Starkel C, CNM 05/01/2019 7:58 AM

## 2019-05-02 ENCOUNTER — Inpatient Hospital Stay (HOSPITAL_COMMUNITY): Payer: BC Managed Care – PPO | Admitting: Anesthesiology

## 2019-05-02 ENCOUNTER — Encounter (HOSPITAL_COMMUNITY): Payer: Self-pay | Admitting: *Deleted

## 2019-05-02 DIAGNOSIS — Z3A38 38 weeks gestation of pregnancy: Secondary | ICD-10-CM

## 2019-05-02 DIAGNOSIS — O1414 Severe pre-eclampsia complicating childbirth: Secondary | ICD-10-CM

## 2019-05-02 DIAGNOSIS — O99824 Streptococcus B carrier state complicating childbirth: Secondary | ICD-10-CM

## 2019-05-02 LAB — CBC
HCT: 33.1 % — ABNORMAL LOW (ref 36.0–46.0)
HCT: 32.3 % — ABNORMAL LOW (ref 36.0–46.0)
Hemoglobin: 11.7 g/dL — ABNORMAL LOW (ref 12.0–15.0)
Hemoglobin: 11.2 g/dL — ABNORMAL LOW (ref 12.0–15.0)
MCH: 28.3 pg (ref 26.0–34.0)
MCH: 28.3 pg (ref 26.0–34.0)
MCHC: 35.3 g/dL (ref 30.0–36.0)
MCHC: 34.7 g/dL (ref 30.0–36.0)
MCV: 80 fL (ref 80.0–100.0)
MCV: 81.6 fL (ref 80.0–100.0)
Platelets: 284 10*3/uL (ref 150–400)
Platelets: 331 10*3/uL (ref 150–400)
RBC: 4.14 MIL/uL (ref 3.87–5.11)
RBC: 3.96 MIL/uL (ref 3.87–5.11)
RDW: 15 % (ref 11.5–15.5)
RDW: 15 % (ref 11.5–15.5)
WBC: 9.7 10*3/uL (ref 4.0–10.5)
WBC: 7.1 10*3/uL (ref 4.0–10.5)
nRBC: 0 % (ref 0.0–0.2)
nRBC: 0 % (ref 0.0–0.2)

## 2019-05-02 MED ORDER — PHENYLEPHRINE 40 MCG/ML (10ML) SYRINGE FOR IV PUSH (FOR BLOOD PRESSURE SUPPORT): 80.0000 ug | PREFILLED_SYRINGE | INTRAVENOUS | Status: DC | PRN

## 2019-05-02 MED ORDER — EPHEDRINE 5 MG/ML INJ: 10.0000 mg | INTRAVENOUS | Status: DC | PRN

## 2019-05-02 MED ORDER — SODIUM CHLORIDE (PF) 0.9 % IJ SOLN
INTRAMUSCULAR | Status: DC | PRN
  Administered 2019-05-02: 6 mL/h via EPIDURAL

## 2019-05-02 MED ORDER — TRANEXAMIC ACID-NACL 1000-0.7 MG/100ML-% IV SOLN
INTRAVENOUS | Status: AC
  Filled 2019-05-02: qty 100

## 2019-05-02 MED ORDER — ONDANSETRON HCL 4 MG PO TABS: 4.0000 mg | ORAL_TABLET | ORAL | Status: DC | PRN

## 2019-05-02 MED ORDER — PRENATAL MULTIVITAMIN CH
1.0000 | ORAL_TABLET | Freq: Every day | ORAL | Status: DC
  Administered 2019-05-03 – 2019-05-04 (×2): 1 via ORAL
  Filled 2019-05-02 (×2): qty 1

## 2019-05-02 MED ORDER — TETANUS-DIPHTH-ACELL PERTUSSIS 5-2.5-18.5 LF-MCG/0.5 IM SUSP: 0.5000 mL | Freq: Once | INTRAMUSCULAR | Status: DC

## 2019-05-02 MED ORDER — MISOPROSTOL 200 MCG PO TABS
ORAL_TABLET | ORAL | Status: AC
  Filled 2019-05-02: qty 4

## 2019-05-02 MED ORDER — BENZOCAINE-MENTHOL 20-0.5 % EX AERO
1.0000 "application " | INHALATION_SPRAY | CUTANEOUS | Status: DC | PRN
  Administered 2019-05-02: 1 via TOPICAL
  Filled 2019-05-02: qty 56

## 2019-05-02 MED ORDER — WITCH HAZEL-GLYCERIN EX PADS
1.0000 "application " | MEDICATED_PAD | CUTANEOUS | Status: DC | PRN
  Administered 2019-05-02: 1 via TOPICAL

## 2019-05-02 MED ORDER — COCONUT OIL OIL: 1.0000 "application " | TOPICAL_OIL | Status: DC | PRN

## 2019-05-02 MED ORDER — FENTANYL CITRATE (PF) 100 MCG/2ML IJ SOLN
100.0000 ug | INTRAMUSCULAR | Status: DC | PRN
  Administered 2019-05-02 (×3): 100 ug via INTRAVENOUS
  Filled 2019-05-02 (×3): qty 2

## 2019-05-02 MED ORDER — FENTANYL-BUPIVACAINE-NACL 0.5-0.125-0.9 MG/250ML-% EP SOLN
12.0000 mL/h | EPIDURAL | Status: DC | PRN
  Filled 2019-05-02: qty 250

## 2019-05-02 MED ORDER — LACTATED RINGERS IV SOLN
500.0000 mL | Freq: Once | INTRAVENOUS | Status: AC
  Administered 2019-05-02: 08:00:00 500 mL via INTRAVENOUS

## 2019-05-02 MED ORDER — METHYLERGONOVINE MALEATE 0.2 MG/ML IJ SOLN
INTRAMUSCULAR | Status: AC
  Filled 2019-05-02: qty 1

## 2019-05-02 MED ORDER — DIPHENHYDRAMINE HCL 25 MG PO CAPS: 25.0000 mg | ORAL_CAPSULE | Freq: Four times a day (QID) | ORAL | Status: DC | PRN

## 2019-05-02 MED ORDER — SENNOSIDES-DOCUSATE SODIUM 8.6-50 MG PO TABS
2.0000 | ORAL_TABLET | ORAL | Status: DC
  Administered 2019-05-02: 2 via ORAL
  Filled 2019-05-02 (×2): qty 2

## 2019-05-02 MED ORDER — ACETAMINOPHEN 325 MG PO TABS: 650.0000 mg | ORAL_TABLET | ORAL | Status: DC | PRN

## 2019-05-02 MED ORDER — MISOPROSTOL 200 MCG PO TABS
800.0000 ug | ORAL_TABLET | Freq: Once | ORAL | Status: AC
  Administered 2019-05-02: 14:00:00 800 ug via RECTAL

## 2019-05-02 MED ORDER — DIBUCAINE (PERIANAL) 1 % EX OINT: 1.0000 "application " | TOPICAL_OINTMENT | CUTANEOUS | Status: DC | PRN

## 2019-05-02 MED ORDER — ZOLPIDEM TARTRATE 5 MG PO TABS: 5.0000 mg | ORAL_TABLET | Freq: Every evening | ORAL | Status: DC | PRN

## 2019-05-02 MED ORDER — ONDANSETRON HCL 4 MG/2ML IJ SOLN: 4.0000 mg | INTRAMUSCULAR | Status: DC | PRN

## 2019-05-02 MED ORDER — TRANEXAMIC ACID-NACL 1000-0.7 MG/100ML-% IV SOLN
1000.0000 mg | INTRAVENOUS | Status: AC
  Administered 2019-05-02: 1000 mg via INTRAVENOUS

## 2019-05-02 MED ORDER — LIDOCAINE-EPINEPHRINE (PF) 2 %-1:200000 IJ SOLN
INTRAMUSCULAR | Status: DC | PRN
  Administered 2019-05-02: 3 mL via EPIDURAL
  Administered 2019-05-02: 2 mL via EPIDURAL

## 2019-05-02 MED ORDER — METHYLERGONOVINE MALEATE 0.2 MG/ML IJ SOLN
0.2000 mg | Freq: Once | INTRAMUSCULAR | Status: AC
  Administered 2019-05-02: 15:00:00 0.2 mg via INTRAMUSCULAR
  Filled 2019-05-02: qty 1

## 2019-05-02 MED ORDER — KETOROLAC TROMETHAMINE 30 MG/ML IJ SOLN
INTRAMUSCULAR | Status: AC
  Administered 2019-05-02: 30 mg
  Filled 2019-05-02: qty 1

## 2019-05-02 MED ORDER — DIPHENHYDRAMINE HCL 50 MG/ML IJ SOLN: 12.5000 mg | INTRAMUSCULAR | Status: DC | PRN

## 2019-05-02 MED ORDER — KETOROLAC TROMETHAMINE 30 MG/ML IJ SOLN: 30.0000 mg | Freq: Once | INTRAMUSCULAR | Status: DC

## 2019-05-02 MED ORDER — SIMETHICONE 80 MG PO CHEW: 80.0000 mg | CHEWABLE_TABLET | ORAL | Status: DC | PRN

## 2019-05-02 MED ORDER — IBUPROFEN 600 MG PO TABS
600.0000 mg | ORAL_TABLET | Freq: Four times a day (QID) | ORAL | Status: DC
  Administered 2019-05-02 – 2019-05-04 (×7): 600 mg via ORAL
  Filled 2019-05-02 (×7): qty 1

## 2019-05-02 NOTE — Discharge Summary (Signed)
OB Discharge Summary     Patient Name: Barbara Moore DOB: 12/02/1990 MRN: [id number]  Date of admission: 04/30/2019 Delivering MD: Nicolette Bang   Date of discharge: 05/04/2019  Admitting diagnosis: HBP SWELLING RT SIDE  Intrauterine pregnancy: [redacted]w[redacted]d     Secondary diagnosis:  Principal Problem:   Severe preeclampsia, delivered Active Problems:   BMI 45.0-49.9, adult (HCC)   Abnormal Pap smear of cervix   Group B streptococcal carriage complicating pregnancy   SVD (spontaneous vaginal delivery)   Vaginal delivery   Postpartum anemia   Postpartum hemorrhage  Additional problems: SPEC     Discharge diagnosis: Term Pregnancy Delivered and Preeclampsia (severe)                                                                                                Post partum procedures:blood transfusion and magnesium x 24 hrs  Augmentation: AROM, Pitocin, Cytotec and Foley Balloon  Complications: [id number]  Hospital course:  Induction of Labor With Vaginal Delivery   28 y.o. yo G1P0 at [redacted]w[redacted]d was admitted to the hospital 04/30/2019 for induction of labor.  Indication for induction: Preeclampsia.  Patient had an uncomplicated labor course as follows: Membrane Rupture Time/Date: 4:30 AM ,05/02/2019   Intrapartum Procedures: Episiotomy: None [1]                                         Lacerations:  Labial [10];Perineal [11]  Patient had delivery of a Viable infant.  Information for the patient's newborn:  Aamari, West [[id number]]  Delivery Method: Vag-Spont    05/02/2019  Details of delivery can be found in separate delivery note.  Patient had a routine postpartum course.She received magnesium x 24 hrs. Was started on Vasotec for BP control. BP well control. Was transfused with 2 units PRBC's. Progressed to ambulating, voiding, tolerating diet, + flatus and good oral pain control. Discharge instructions, medications and follow up were reviewed with the pt.  Patient  is discharged home 05/04/19.  Physical exam  Vitals:   05/03/19 1651 05/03/19 2039 05/04/19 0529 05/04/19 0745  BP: 123/72 140/83 132/82 129/79  Pulse:  90 96 74  Resp: 18 18 18 18   Temp: 97.9 F (36.6 C) 98 F (36.7 C) 97.9 F (36.6 C) 98.4 F (36.9 C)  TempSrc: Oral Oral Oral Oral  SpO2: 99% 100% 100% 100%  Weight:      Height:       General: alert Lochia: appropriate Uterine Fundus: firm Incision: Healing well with no significant drainage DVT Evaluation: No evidence of DVT seen on physical exam. Labs: Lab Results  Component Value Date   WBC 10.0 05/04/2019   HGB 8.9 (L) 05/04/2019   HCT 25.6 (L) 05/04/2019   MCV 83.1 05/04/2019   PLT 246 05/04/2019   CMP Latest Ref Rng & Units 05/04/2019  Glucose 70 - 99 mg/dL 86  BUN 6 - 20 mg/dL 7  Creatinine 0.44 - 1.00 mg/dL 0.67  Sodium 135 - 145 mmol/L 139  Potassium 3.5 - 5.1 mmol/L 3.8  Chloride 98 - 111 mmol/L 108  CO2 22 - 32 mmol/L 24  Calcium 8.9 - 10.3 mg/dL 1.6(X8.4(L)  Total Protein 6.5 - 8.1 g/dL 0.9(U5.4(L)  Total Bilirubin 0.3 - 1.2 mg/dL 0.5  Alkaline Phos 38 - 126 U/L 91  AST 15 - 41 U/L 16  ALT 0 - 44 U/L 22    Discharge instruction: per After Visit Summary and "Baby and Me Booklet".  After visit meds:  Allergies as of 05/04/2019      Reactions   Lactose Intolerance (gi) Diarrhea, Nausea And Vomiting      Medication List    STOP taking these medications   valACYclovir 500 MG tablet Commonly known as: VALTREX     TAKE these medications   enalapril 10 MG tablet Commonly known as: VASOTEC Take 1 tablet (10 mg total) by mouth daily. Start taking on: May 05, 2019   ibuprofen 600 MG tablet Commonly known as: ADVIL Take 1 tablet (600 mg total) by mouth every 6 (six) hours.   PNV PRENATAL PLUS MULTIVIT+DHA PO Take 1 capsule by mouth daily.       Diet: low salt diet  Activity: Advance as tolerated. Pelvic rest for 6 weeks.   Outpatient follow up:4 weeks Follow up Appt: Future Appointments  Date  Time Provider Department Center  05/12/2019 10:20 AM [facility] NURSE [facility]  05/31/2019  9:35 AM Armando ReichertHogan, Heather D, CNM [facility]   Follow up Visit:No follow-ups on file.   Please schedule this patient for Postpartum visit in: 4 weeks with the following provider: Any provider For C/S patients schedule nurse incision check in weeks 2 weeks: no High risk pregnancy complicated by: Severe Pre-E  Delivery mode:  SVD Anticipated Birth Control:  POPs PP Procedures needed: BP check, PP colpo  Schedule Integrated BH visit: no   Postpartum contraception: Progesterone only pills  Newborn Data: Live born female  Birth Weight:  As recorded APGAR: 8, 9  Newborn Delivery   Birth date/time: 05/02/2019 13:46:00 Delivery type: Vaginal, Spontaneous      Baby Feeding: Breast Disposition:home with mother   05/04/2019 Hermina StaggersMichael L Isamar Wellbrock, MD

## 2019-05-02 NOTE — Anesthesia Procedure Notes (Signed)
Epidural Patient location during procedure: OB Start time: 05/02/2019 10:30 AM End time: 05/02/2019 11:00 AM  Staffing Anesthesiologist: Freddrick March, MD Performed: anesthesiologist   Preanesthetic Checklist Completed: patient identified, pre-op evaluation, timeout performed, IV checked, risks and benefits discussed and monitors and equipment checked  Epidural Patient position: sitting Prep: site prepped and draped and DuraPrep Patient monitoring: continuous pulse ox, blood pressure, heart rate and cardiac monitor Approach: midline Location: L3-L4 Injection technique: LOR air  Needle:  Needle type: Tuohy  Needle gauge: 17 G Needle length: 9 cm Needle insertion depth: 11 cm Catheter type: closed end flexible Catheter size: 19 Gauge Catheter at skin depth: 17 cm Test dose: negative  Assessment Sensory level: T8 Events: blood not aspirated, injection not painful, no injection resistance, negative IV test and no paresthesia  Additional Notes Patient identified. Risks/Benefits/Options discussed with patient including but not limited to bleeding, infection, nerve damage, paralysis, failed block, incomplete pain control, headache, blood pressure changes, nausea, vomiting, reactions to medication both or allergic, itching and postpartum back pain. Confirmed with bedside nurse the patient's most recent platelet count. Confirmed with patient that they are not currently taking any anticoagulation, have any bleeding history or any family history of bleeding disorders. Patient expressed understanding and wished to proceed. All questions were answered. Sterile technique was used throughout the entire procedure. Please see nursing notes for vital signs. Test dose was given through epidural catheter and negative prior to continuing to dose epidural or start infusion. Warning signs of high block given to the patient including shortness of breath, tingling/numbness in hands, complete motor  block, or any concerning symptoms with instructions to call for help. Patient was given instructions on fall risk and not to get out of bed. All questions and concerns addressed with instructions to call with any issues or inadequate analgesia.  Reason for block:procedure for pain

## 2019-05-02 NOTE — Progress Notes (Signed)
Barbara Moore is a 28 y.o. G1P0 at [redacted]w[redacted]d admitted for induction of labor due to preeclampsia with severe features.  Subjective: Pt feeling contractions, reports they are not painful.   Objective: BP 134/83   Pulse 92   Temp 97.9 F (36.6 C) (Axillary)   Resp 18   Ht 5\' 8"  (1.727 m)   Wt (!) 147.4 kg   LMP 08/02/2018   SpO2 100%   BMI 49.42 kg/m  I/O last 3 completed shifts: In: 3249.7 [P.O.:1005; I.V.:1694.7; IV Piggyback:550] Out: 3570 [Urine:2850] Total I/O In: 1941.4 [P.O.:716; I.V.:1125.4; IV Piggyback:100] Out: 2050 [Urine:2050]  FHT:  FHR: 145 bpm, variability: moderate,  accelerations:  Present,  decelerations:  Absent UC:   regular, every 2-3 minutes SVE:   Dilation: 5 Effacement (%): 80 Station: -1 Exam by:: L. Leftwich-Kirby CNM AROM, clear fluid, IUPC placed without difficulty. Pt tolerated well. +  Labs: Lab Results  Component Value Date   WBC 8.9 05/01/2019   HGB 10.8 (L) 05/01/2019   HCT 31.3 (L) 05/01/2019   MCV 81.7 05/01/2019   PLT 295 05/01/2019    Assessment / Plan: Induction of labor due to preeclampsia,  progressing well on pitocin  Labor: Progressing normally Preeclampsia:  labs stable Fetal Wellbeing:  Category I Pain Control:  Labor support without medications I/D:  GBS Anticipated MOD:  NSVD  Barbara Moore 05/02/2019, 4:42 AM

## 2019-05-02 NOTE — Progress Notes (Addendum)
LABOR PROGRESS NOTE  Barbara Moore is a 28 y.o. G1P0 at [redacted]w[redacted]d admitted for IOL for preeclampsia with severe features.  Subjective: Doing well, feeling her contractions a bit more and reports they are more painful overall. No other concerns at this time; remains asymptomatic.  Objective: BP (!) 109/54   Pulse 82   Temp 98.1 F (36.7 C) (Oral)   Resp 20   Ht 5\' 8"  (1.727 m)   Wt (!) 147.4 kg   LMP 08/02/2018   SpO2 100%   BMI 49.42 kg/m  or  Vitals:   05/01/19 2231 05/01/19 2332 05/02/19 0000 05/02/19 0030  BP: (!) 111/55 115/60 115/60 (!) 109/54  Pulse: 78 80 82 82  Resp: 18 18 20 20   Temp:      TempSrc:      SpO2:      Weight:      Height:       Dilation: 4 Effacement (%): 60 Cervical Position: Posterior Station: -3 Presentation: Vertex Exam by:: Dr Jearl Klinefelter: Baseline rate 130, minimal-moderate varibility, -accels, -decels Toco: Occasional contractions  Labs: Lab Results  Component Value Date   WBC 8.9 05/01/2019   HGB 10.8 (L) 05/01/2019   HCT 31.3 (L) 05/01/2019   MCV 81.7 05/01/2019   PLT 295 05/01/2019    Patient Active Problem List   Diagnosis Date Noted  . Preeclampsia, severe, third trimester 05/01/2019  . Group B streptococcal carriage complicating pregnancy 42/59/5638  . NST (non-stress test) reactive 04/17/2019  . Abnormal Pap smear of cervix 11/11/2018  . BMI 45.0-49.9, adult ([place]) 10/28/2018  . Supervision of low-risk first pregnancy 10/23/2018  . HSV-2 infection 10/23/2018    Assessment / Plan: 28 y.o. G1P0 at [redacted]w[redacted]d here for IOL for preeclampsia with severe features.  Pre-eclampsia with SF: On Mag and remains asymptomatic; BP well-controlled. Labor: Progressing after foley bulb placement with cervical change. Contractions remain inadequate. Continue Pitocin 2x2 per protocol. Fetal Wellbeing: Category 2 as above; will continue to monitor Pain Control: None; considering IV pain medicine and epidural when desired Anticipated MOD:  SVD  Vilma Meckel, MD  Family Medicine PGY-2 05/02/2019, 12:43 AM   Tracing is Category I, with moderate variability and no decelerations.  Otherwise, I agree with the resident's note.  [name], CNM 7:51 AM

## 2019-05-02 NOTE — Progress Notes (Signed)
LABOR PROGRESS NOTE  Barbara Moore is a 28 y.o. G1P0 at [redacted]w[redacted]d  admitted for IOL due to preE w/ SF.  Subjective: She is doing well, resting in bed lying on her left side. She is feeling tired and experiencing minimal discomfort with contractions.   Objective: BP 135/68   Pulse 71   Temp 98.2 F (36.8 C) (Oral)   Resp 16   Ht 5\' 8"  (1.727 m)   Wt (!) 147.4 kg   LMP 08/02/2018   SpO2 96%   BMI 49.42 kg/m  or  Vitals:   05/02/19 1105 05/02/19 1106 05/02/19 1110 05/02/19 1116  BP:  126/72 129/76 135/68  Pulse:  83 83 71  Resp:  20 16   Temp:   98.2 F (36.8 C)   TempSrc:   Oral   SpO2: 97% 97% 96%   Weight:      Height:         Dilation: 5.5 Effacement (%): 90 Cervical Position: Posterior Station: -1 Presentation: Vertex Exam by:: Varney Baas, RN FHT: baseline rate 135 bpm , moderate varibility, 15 x 15 acel, no decel Toco: 1-2 mins  Labs: Lab Results  Component Value Date   WBC 9.7 05/02/2019   HGB 11.7 (L) 05/02/2019   HCT 33.1 (L) 05/02/2019   MCV 80.0 05/02/2019   PLT 284 05/02/2019    Patient Active Problem List   Diagnosis Date Noted  . Preeclampsia, severe, third trimester 05/01/2019  . Group B streptococcal carriage complicating pregnancy 44/31/5400  . NST (non-stress test) reactive 04/17/2019  . Abnormal Pap smear of cervix 11/11/2018  . BMI 45.0-49.9, adult ([facility]) 10/28/2018  . Supervision of low-risk first pregnancy 10/23/2018  . HSV-2 infection 10/23/2018    Assessment / Plan: 28 y.o. G1P0 at [redacted]w[redacted]d here for IOL due to preE w/ SF.  Labor: Continue time appropriate cervical exams for the progression of labor. IUPC replaced due to inadequate resting tone. Pitocin 20 mU/min. Mg++ 2g/hr (alert oriented and no signs of respiratory distress). Fetal Wellbeing:  Cat I, vertex (sutures palpated on exam) Pain Control:  Epidural in place and adequate Anticipated MOD: Vaginal  Carisa Backhaus Autry-Lott, D.O. Family Medicine Resident, PGY-1 05/02/2019, 11:16  AM

## 2019-05-02 NOTE — Anesthesia Preprocedure Evaluation (Signed)
Anesthesia Evaluation  Patient identified by MRN, date of birth, ID band Patient awake    Reviewed: Allergy & Precautions, NPO status , Patient's Chart, lab work & pertinent test results  Airway Mallampati: III  TM Distance: >3 FB Neck ROM: Full    Dental no notable dental hx.    Pulmonary neg pulmonary ROS,    Pulmonary exam normal breath sounds clear to auscultation       Cardiovascular hypertension, Normal cardiovascular exam Rhythm:Regular Rate:Normal     Neuro/Psych negative neurological ROS  negative psych ROS   GI/Hepatic negative GI ROS, Neg liver ROS,   Endo/Other  Morbid obesity  Renal/GU negative Renal ROS  negative genitourinary   Musculoskeletal negative musculoskeletal ROS (+)   Abdominal   Peds  Hematology  (+) Blood dyscrasia, Sickle cell trait ,   Anesthesia Other Findings IOL for preE with severe features on magnesium  Reproductive/Obstetrics (+) Pregnancy                             Anesthesia Physical Anesthesia Plan  ASA: III  Anesthesia Plan: Epidural   Post-op Pain Management:    Induction:   PONV Risk Score and Plan: Treatment may vary due to age or medical condition  Airway Management Planned: Natural Airway  Additional Equipment:   Intra-op Plan:   Post-operative Plan:   Informed Consent: I have reviewed the patients History and Physical, chart, labs and discussed the procedure including the risks, benefits and alternatives for the proposed anesthesia with the patient or authorized representative who has indicated his/her understanding and acceptance.       Plan Discussed with: Anesthesiologist  Anesthesia Plan Comments: (Patient identified. Risks, benefits, options discussed with patient including but not limited to bleeding, infection, nerve damage, paralysis, failed block, incomplete pain control, headache, blood pressure changes, nausea,  vomiting, reactions to medication, itching, and post partum back pain. Confirmed with bedside nurse the patient's most recent platelet count. Confirmed with the patient that they are not taking any anticoagulation, have any bleeding history or any family history of bleeding disorders. Patient expressed understanding and wishes to proceed. All questions were answered. )        Anesthesia Quick Evaluation

## 2019-05-03 DIAGNOSIS — O9081 Anemia of the puerperium: Secondary | ICD-10-CM

## 2019-05-03 HISTORY — DX: Anemia of the puerperium: O90.81

## 2019-05-03 LAB — CBC
HCT: 21.8 % — ABNORMAL LOW (ref 36.0–46.0)
Hemoglobin: 7.7 g/dL — ABNORMAL LOW (ref 12.0–15.0)
MCH: 29.2 pg (ref 26.0–34.0)
MCHC: 35.3 g/dL (ref 30.0–36.0)
MCV: 82.6 fL (ref 80.0–100.0)
Platelets: 252 10*3/uL (ref 150–400)
RBC: 2.64 MIL/uL — ABNORMAL LOW (ref 3.87–5.11)
RDW: 15.2 % (ref 11.5–15.5)
WBC: 9.7 10*3/uL (ref 4.0–10.5)
nRBC: 0.2 % (ref 0.0–0.2)

## 2019-05-03 LAB — ABO/RH: ABO/RH(D): A POS

## 2019-05-03 LAB — PREPARE RBC (CROSSMATCH)

## 2019-05-03 MED ORDER — SODIUM CHLORIDE 0.9% IV SOLUTION
Freq: Once | INTRAVENOUS | Status: AC
  Administered 2019-05-03: 08:00:00 via INTRAVENOUS

## 2019-05-03 MED ORDER — DIPHENHYDRAMINE HCL 25 MG PO CAPS
25.0000 mg | ORAL_CAPSULE | Freq: Once | ORAL | Status: AC
  Administered 2019-05-03: 25 mg via ORAL
  Filled 2019-05-03: qty 1

## 2019-05-03 MED ORDER — ENALAPRIL MALEATE 10 MG PO TABS
10.0000 mg | ORAL_TABLET | Freq: Every day | ORAL | Status: DC
  Administered 2019-05-03 – 2019-05-04 (×2): 10 mg via ORAL
  Filled 2019-05-03 (×2): qty 1

## 2019-05-03 MED ORDER — FUROSEMIDE 10 MG/ML IJ SOLN
20.0000 mg | Freq: Once | INTRAMUSCULAR | Status: AC
  Administered 2019-05-03: 11:00:00 20 mg via INTRAVENOUS
  Filled 2019-05-03: qty 2

## 2019-05-03 MED ORDER — ACETAMINOPHEN 500 MG PO TABS
1000.0000 mg | ORAL_TABLET | Freq: Once | ORAL | Status: AC
  Administered 2019-05-03: 08:00:00 1000 mg via ORAL
  Filled 2019-05-03: qty 2

## 2019-05-03 NOTE — Anesthesia Postprocedure Evaluation (Signed)
Anesthesia Post Note  Patient: Barbara Moore  Procedure(s) Performed: AN AD [facility]     Patient location during evaluation: Mother Baby Anesthesia Type: Epidural Level of consciousness: awake and alert Pain management: pain level controlled Vital Signs Assessment: post-procedure vital signs reviewed and stable Respiratory status: spontaneous breathing, nonlabored ventilation and respiratory function stable Cardiovascular status: stable Postop Assessment: no headache, no backache and epidural receding Anesthetic complications: no Comments: Spoke with pt on the phone. No complaints from pt, no anesthetic complications noted.    Last Vitals:  Vitals:   05/03/19 0805 05/03/19 0825  BP: (!) 122/50 (!) 115/59  Pulse: 86 86  Resp: 18 18  Temp: 37 C 36.9 C  SpO2: 97% 99%    Last Pain:  Vitals:   05/03/19 0825  TempSrc: Oral  PainSc:    Pain Goal: Patients Stated Pain Goal: 3 (05/03/19 0520)                 Riki Sheer

## 2019-05-03 NOTE — Progress Notes (Signed)
Postpartum Day 2: Vaginal Delivery at [id number] in the setting of severe preeclampsia  Subjective: Patient is ambulating but reports lightheadedness and fatigue.  Denies any headaches, visual symptoms, RUQ/epigastric pain. Minimal lochia. Baby is doing well at bedside. Patient reports tolerating PO, + flatus and no problems voiding.    Objective: Vital signs in last 24 hours: Temp:  [97.6 F (36.4 C)-99.3 F (37.4 C)] 98.3 F (36.8 C) (07/27 0536) Pulse Rate:  [68-112] 95 (07/27 0536) Resp:  [16-20] 17 (07/27 0536) BP: (96-148)/(43-95) 142/95 (07/27 0536) SpO2:  [95 %-100 %] 99 % (07/27 0536) Patient Vitals for the past 24 hrs:  BP Temp Temp src Pulse Resp SpO2  05/03/19 0536 (!) 142/95 98.3 F (36.8 C) Oral 95 17 99 %  05/03/19 0510 - - - - 16 -  05/03/19 0400 - - - - 18 -  05/03/19 0300 - - - - 16 -  05/03/19 0130 - - - - 18 -  05/02/19 2316 (!) 148/80 98.3 F (36.8 C) Oral (!) 108 18 100 %  05/02/19 1920 124/74 98.2 F (36.8 C) Oral 96 18 100 %  05/02/19 1749 129/72 98.7 F (37.1 C) Oral 98 18 100 %  05/02/19 1625 124/74 99.3 F (37.4 C) Oral (!) 102 18 100 %  05/02/19 1600 137/79 - - (!) 105 18 -  05/02/19 1545 (!) 142/84 - - (!) 105 16 -  05/02/19 1531 130/85 - - (!) 106 18 -  05/02/19 1530 - - - - - 100 %  05/02/19 1525 - - - - - 100 %  05/02/19 1520 - - - - - 100 %  05/02/19 1519 - - - - - 100 %  05/02/19 1516 126/60 - - 95 18 -  05/02/19 1515 - - - - - 99 %  05/02/19 1510 - - - - - 100 %  05/02/19 1505 - - - - - 99 %  05/02/19 1501 129/77 - - 94 18 98 %  05/02/19 1445 (!) 141/86 - - 99 18 99 %  05/02/19 1430 136/74 98.9 F (37.2 C) Oral 99 18 97 %  05/02/19 1415 131/81 - - 93 18 97 %  05/02/19 1404 (!) 141/81 - - (!) 101 18 -  05/02/19 1400 - - - - - 98 %  05/02/19 1348 124/71 - - (!) 112 18 -  05/02/19 1301 (!) 128/94 98.1 F (36.7 C) Oral 88 18 -  05/02/19 1235 - - - - - 98 %  05/02/19 1231 110/61 - - 96 18 -  05/02/19 1230 - - - - - 99 %  05/02/19 1225 -  - - - - 99 %  05/02/19 1220 - - - - - 98 %  05/02/19 1210 - - - - - 98 %  05/02/19 1205 - - - - - 98 %  05/02/19 1201 110/61 - - 97 20 -  05/02/19 1200 - - - - - 98 %  05/02/19 1155 - - - - - 100 %  05/02/19 1150 - - - - - 97 %  05/02/19 1145 - - - - - 99 %  05/02/19 1140 - - - - - 98 %  05/02/19 1135 - - - - - 100 %  05/02/19 1131 130/69 - - 79 18 -  05/02/19 1126 132/72 - - 78 16 98 %  05/02/19 1125 - - - - - 98 %  05/02/19 1120 127/73 - - 88 20 95 %  05/02/19 1116  135/68 - - 71 18 -  05/02/19 1115 - - - - - 97 %  05/02/19 1110 129/76 98.2 F (36.8 C) Oral 83 16 96 %  05/02/19 1106 126/72 - - 83 20 97 %  05/02/19 1105 - - - - - 97 %  05/02/19 1101 134/76 - - 87 18 -  05/02/19 1100 - - - - - 97 %  05/02/19 1056 126/77 - - 87 18 -  05/02/19 1055 - - - - - 99 %  05/02/19 1052 106/82 - - 95 18 -  05/02/19 1050 - - - - - 99 %  05/02/19 1046 130/69 - - 72 16 -  05/02/19 1045 135/74 - - 73 18 99 %  05/02/19 1030 (!) 146/80 - - 85 18 -  05/02/19 1016 140/83 - - 93 18 -  05/02/19 1001 135/88 - - 79 18 -  05/02/19 0945 125/80 97.6 F (36.4 C) Oral 68 18 -  05/02/19 0931 118/76 - - 88 18 -  05/02/19 0921 (!) 97/57 - - 93 18 -  05/02/19 0920 - - - - - 100 %  05/02/19 0916 (!) 118/55 - - 94 18 -  05/02/19 0915 - - - - - 100 %  05/02/19 0911 111/75 - - 95 18 -  05/02/19 0907 (!) 96/43 - - 99 18 -  05/02/19 0901 116/78 - - 96 16 -  05/02/19 0855 117/64 - - 99 18 100 %  05/02/19 0851 109/69 - - 97 16 -  05/02/19 0845 (!) 111/45 - - (!) 112 16 99 %  05/02/19 0840 116/69 - - 97 18 -  05/02/19 0835 126/66 - - 85 18 100 %  05/02/19 0731 129/65 - - 91 16 -  05/02/19 0725 (!) 131/58 98.2 F (36.8 C) Oral 85 16 -  05/02/19 0700 129/61 - - 90 - -    Intake/Output Summary (Last 24 hours) at 05/03/2019 0657 Last data filed at 05/03/2019 0500 Gross per 24 hour  Intake 3604.32 ml  Output 2368 ml  Net 1236.32 ml    Physical Exam:  General: alert and no distress Lochia:  appropriate Uterine Fundus: firm DVT Evaluation: No evidence of DVT seen on physical exam. 2+ BLE  Labs: CBC Latest Ref Rng & Units 05/03/2019 05/02/2019 05/02/2019  WBC 4.0 - 10.5 K/uL 9.7 7.1 9.7  Hemoglobin 12.0 - 15.0 g/dL 7.7(L) 11.2(L) 11.7(L)  Hematocrit 36.0 - 46.0 % 21.8(L) 32.3(L) 33.1(L)  Platelets 150 - 400 K/uL 252 331 284   CMP Latest Ref Rng & Units 04/30/2019 07/08/2018 01/26/2016  Glucose 70 - 99 mg/dL 90 94 784(O104(H)  BUN 6 - 20 mg/dL 8 11 13   Creatinine 0.44 - 1.00 mg/dL 9.620.72 9.520.90 8.410.70  Sodium 135 - 145 mmol/L 138 138 142  Potassium 3.5 - 5.1 mmol/L 4.0 3.5 3.4(L)  Chloride 98 - 111 mmol/L 110 103 103  CO2 22 - 32 mmol/L 20(L) 26 -  Calcium 8.9 - 10.3 mg/dL 9.1 9.1 -  Total Protein 6.5 - 8.1 g/dL 6.2(L) 7.5 -  Total Bilirubin 0.3 - 1.2 mg/dL 0.5 0.5 -  Alkaline Phos 38 - 126 U/L 137(H) 83 -  AST 15 - 41 U/L 14(L) 15 -  ALT 0 - 44 U/L 18 17 -    Assessment/Plan: PPD#1 s/p SVD after IOL for severe preeclampsia. Delivery complicated by PPH and now symptomatic anemia - Continue magnesium sulfate x 24 hours postpartum.  Vasotec 10 mg qd started for BP control -  Counseled about transfusion for symptomatic anemia, she agreed to get transfusion. Ordered for 2 units pRBCs. Follow up hemoglobin tomorrow morning. - Regular diet - She desires circumcision for her female infant, to be done later today or tomorrow. Breastfeeding. Plans POPs for contraception Continue current care.  Verita Schneiders, MD 05/03/2019, 6:48 AM

## 2019-05-03 NOTE — Progress Notes (Signed)
Patient ID: Barbara Moore, female   DOB: 1991-07-27, 28 y.o.   MRN: [id number] Attending Circumcision Counseling Progress Note  Patient desires circumcision for her female infant.  Circumcision procedure details discussed, risks and benefits of procedure were also discussed.  These include but are not limited to: Benefits of circumcision in men include reduction in the rates of urinary tract infection (UTI), penile cancer, some sexually transmitted infections, penile inflammatory and retractile disorders, as well as easier hygiene.  Risks include bleeding , infection, injury of glans which may lead to penile deformity or urinary tract issues, unsatisfactory cosmetic appearance and other potential complications related to the procedure.  It was emphasized that this is an elective procedure.  Patient wants to proceed with circumcision; written informed consent obtained.  Will do circumcision soon, routine circumcision and post circumcision care ordered for the infant.  Mikal Blasdell L. Rip Harbour, M.D. 05/03/2019 2:46 PM

## 2019-05-03 NOTE — Lactation Note (Signed)
This note was copied from a baby's chart. Lactation Consultation Note Baby 58 hrs old. Baby has no interest in BF at this time. Mom is on Mag. Mom and baby are both sleepy. Mom has very Large pendulous breast. Everted nipple at the bottom end of the breast. Encouraged mom today to wear her bra to help w/dependent edema. Slight edema noted. Breast compressible. Colostrum easily expressed. Mom demonstrated hand expression. Collected drops, gave to baby w/gloved finger to stimulate hunger. Baby suckled a few times on the gloved finger, then started gagging.  Discussed ways of stimulation to wake baby. Mom encouraged to feed baby 8-12 times/24 hours and with feeding cues. Mom encouraged to waken baby for feeds if hasn't cued in 3 hrs. Newborn behavior, STS, I&O, cluster feeding, breast massage, supply and demand discussed. Encouraged mom to rest while baby does and call for assistance if needed. Lactation brochure given.  Patient Name: Boy Story Conti [id number] Date: 05/03/2019 Reason for consult: Initial assessment;Primapara;1st time breastfeeding;Term   Maternal Data Has patient been taught Hand Expression?: Yes Does the patient have breastfeeding experience prior to this delivery?: No  Feeding Feeding Type: Breast Milk  LATCH Score Latch: Too sleepy or reluctant, no latch achieved, no sucking elicited.  Audible Swallowing: None  Type of Nipple: Everted at rest and after stimulation  Comfort (Breast/Nipple): Filling, red/small blisters or bruises, mild/mod discomfort(edema/tender)        Interventions Interventions: Breast feeding basics reviewed;Support pillows;Skin to skin;Position options;Breast massage;Expressed milk;Hand express;Breast compression  Lactation Tools Discussed/Used WIC Program: Yes   Consult Status Consult Status: Follow-up Date: 05/03/19 Follow-up type: In-patient    Onda Kattner, [place] 05/03/2019, 4:03 AM

## 2019-05-03 NOTE — Lactation Note (Signed)
This note was copied from a baby's chart. Lactation Consultation Note  Patient Name: Barbara Moore [id number] Date: 05/03/2019 Reason for consult: Follow-up assessment;Primapara;1st time breastfeeding;Term;Infant weight loss;Other (Comment)(per mom had a circ) baby is 28 hours / post circ / mom requested for the baby to be woken up by 6 pm . Baby last fed at  @ 20 15 and had 30 ml of formula.  As LC was changing a medium mec stool . Baby spit up a small amount of undigested formula.  LC noted the vase gauge to be 1/2 way off prior to changing the stool diaper, and has LC was changing the diaper  It came off. Tried to reapply and would not stay. No bleeding noted and vaseline applied to the front of the diaper.  LC informed the [place] RN of the gauge falling off and to recheck. LC assisted mom to latch on the right breast STS and baby sleepy and would not open is mouth.  LC recommended STS for now and allow the baby to get hungry and show more feeding cues.  LC explained to mom is normal post circ for the baby to be sleepy, spitty. LC  also recommended for the baby to be allowed to get hungry prior to giving formula. If into the evening is still not latching pump with the DEBP that is already set up at the bedside , spoon feed the baby the colostrum and supplement if needed 20- 25 ml.  [facility] RN [place]ella Nipple aware.    Maternal Data Has patient been taught Hand Expression?: Yes  Feeding Feeding Type: Breast Fed  LATCH Score Latch: Too sleepy or reluctant, no latch achieved, no sucking elicited.  Audible Swallowing: None  Type of Nipple: Everted at rest and after stimulation  Comfort (Breast/Nipple): Soft / non-tender  Hold (Positioning): Assistance needed to correctly position infant at breast and maintain latch.  LATCH Score: 5  Interventions Interventions: Breast feeding basics reviewed;Assisted with latch;Skin to skin;Breast compression;Adjust position;Support pillows;Position  options  Lactation Tools Discussed/Used Pump Review: Milk Storage(LC reviewed storage)   Consult Status Consult Status: Follow-up Date: 05/04/19 Follow-up type: In-patient    [place] 05/03/2019, 6:16 PM

## 2019-05-04 ENCOUNTER — Telehealth: Payer: BC Managed Care – PPO | Admitting: Advanced Practice Midwife

## 2019-05-04 LAB — COMPREHENSIVE METABOLIC PANEL
ALT: 22 U/L (ref 0–44)
AST: 16 U/L (ref 15–41)
Albumin: 2.3 g/dL — ABNORMAL LOW (ref 3.5–5.0)
Alkaline Phosphatase: 91 U/L (ref 38–126)
Anion gap: 7 (ref 5–15)
BUN: 7 mg/dL (ref 6–20)
CO2: 24 mmol/L (ref 22–32)
Calcium: 8.4 mg/dL — ABNORMAL LOW (ref 8.9–10.3)
Chloride: 108 mmol/L (ref 98–111)
Creatinine, Ser: 0.67 mg/dL (ref 0.44–1.00)
GFR calc Af Amer: >60 mL/min (ref >60)
GFR calc non Af Amer: >60 mL/min (ref >60)
Glucose, Bld: 86 mg/dL (ref 70–99)
Potassium: 3.8 mmol/L (ref 3.5–5.1)
Sodium: 139 mmol/L (ref 135–145)
Total Bilirubin: 0.5 mg/dL (ref 0.3–1.2)
Total Protein: 5.4 g/dL — ABNORMAL LOW (ref 6.5–8.1)

## 2019-05-04 LAB — BPAM RBC
Blood Product Expiration Date: 202008172359
Blood Product Expiration Date: 202008172359
ISSUE DATE / TIME: 202007270758
ISSUE DATE / TIME: 202007271133
Unit Type and Rh: 6200
Unit Type and Rh: 6200

## 2019-05-04 LAB — TYPE AND SCREEN
ABO/RH(D): A POS
Antibody Screen: NEGATIVE
Unit division: 0
Unit division: 0

## 2019-05-04 LAB — CBC
HCT: 25.6 % — ABNORMAL LOW (ref 36.0–46.0)
Hemoglobin: 8.9 g/dL — ABNORMAL LOW (ref 12.0–15.0)
MCH: 28.9 pg (ref 26.0–34.0)
MCHC: 34.8 g/dL (ref 30.0–36.0)
MCV: 83.1 fL (ref 80.0–100.0)
Platelets: 246 10*3/uL (ref 150–400)
RBC: 3.08 MIL/uL — ABNORMAL LOW (ref 3.87–5.11)
RDW: 15.6 % — ABNORMAL HIGH (ref 11.5–15.5)
WBC: 10 10*3/uL (ref 4.0–10.5)
nRBC: 0 % (ref 0.0–0.2)

## 2019-05-04 MED ORDER — ENALAPRIL MALEATE 10 MG PO TABS: 10.0000 mg | ORAL_TABLET | Freq: Every day | ORAL | 1 refills | Status: DC

## 2019-05-04 MED ORDER — IBUPROFEN 600 MG PO TABS: 600.0000 mg | ORAL_TABLET | Freq: Four times a day (QID) | ORAL | 0 refills | Status: DC

## 2019-05-04 MED FILL — ENALAPRIL MALEATE 10 MG TAB: 10 | 30 days supply | Qty: 30 | Fill #0

## 2019-05-04 MED FILL — IBUPROFEN 600 MG TABLET: 600 | 5 days supply | Qty: 30 | Fill #0

## 2019-05-04 NOTE — Lactation Note (Signed)
This note was copied from a baby's chart. Lactation Consultation Note  Patient Name: Barbara Moore [id number] Date: 05/04/2019 Reason for consult: Follow-up assessment  2nd [place] visit.  LC reviewed supply / demand and the importance of consistent pumping around the  Clock until the  Baby is latching.  Sore nipple and engorgement prevention and tx reviewed.  Per mom has a DEBP - [place] at home.  LC offered to request and LC O/P appt. For this Thursday or Friday and mom receptive.  [place] placed a request in the Epic basket for the [facility] O/P clinic.    Maternal Data Has patient been taught Hand Expression?: Yes  Feeding Feeding Type: Breast Fed Nipple Type: Slow - flow  LATCH Score - earlier latch attempt  Latch: Repeated attempts needed to sustain latch, nipple held in mouth throughout feeding, stimulation needed to elicit sucking reflex.  Audible Swallowing: None  Type of Nipple: Everted at rest and after stimulation  Comfort (Breast/Nipple): Soft / non-tender  Hold (Positioning): Assistance needed to correctly position infant at breast and maintain latch.  LATCH Score: 6  Interventions Interventions: Breast feeding basics reviewed  Lactation Tools Discussed/Used Tools: Pump Breast pump type: Double-Electric Breast Pump Pump Review: Milk Storage   Consult Status Consult Status: Follow-up(LC offered to request and LC O/P at [facility] heath and mom receptive) Date: 05/04/19 Follow-up type: [place] 05/04/2019, 9:50 AM

## 2019-05-04 NOTE — Progress Notes (Signed)
Discharge instructions provided to pt. Discussed signs and symptoms to report to the MD, post-partum depression, upcoming appointments, and meds. Pt verbalizes understanding and has no questions or concerns at this time. Pt received prescriptions from pharmacy. Pt discharged from hospital in stable condition.

## 2019-05-04 NOTE — Lactation Note (Signed)
This note was copied from a baby's chart. Lactation Consultation Note  Patient Name: Boy Jaidence Geisler [id number] Date: 05/04/2019 Reason for consult: Follow-up assessment;Term;Primapara;1st time breastfeeding;Infant weight loss Baby is 42 hours  As LC entered the room mom was opening a bottle to feed the baby.  Baby was noted to stir in the crib.  LC offered to assist with latch. LC placed baby STS and attempted to latch and baby not interested.  Was fed a bottle at 0700- 10 ml.  Per mom has pumped a few times and has hand expressed with drops.  LC reviewed supply / demand and the importance of consistent pumping to enhance her milk coming in.  Also to make the nipple areola complex more elastic and elongate so the baby can latch better.  Baby is not getting any depth when attempt to latch.  Will F/U with mom.   Maternal Data Has patient been taught Hand Expression?: Yes  Feeding Feeding Type: Breast Fed Nipple Type: Slow - flow  LATCH Score Latch: Repeated attempts needed to sustain latch, nipple held in mouth throughout feeding, stimulation needed to elicit sucking reflex.  Audible Swallowing: None  Type of Nipple: Everted at rest and after stimulation  Comfort (Breast/Nipple): Soft / non-tender  Hold (Positioning): Assistance needed to correctly position infant at breast and maintain latch.  LATCH Score: 6  Interventions Interventions: Breast feeding basics reviewed;Assisted with latch;Skin to skin;Breast massage;Adjust position;Support pillows;Position options  Lactation Tools Discussed/Used Tools: Pump Breast pump type: Double-Electric Breast Pump Pump Review: Milk Storage   Consult Status Consult Status: Follow-up Date: 05/04/19 Follow-up type: In-patient    [place] 05/04/2019, 8:23 AM

## 2019-05-04 NOTE — Discharge Instructions (Signed)
Postpartum Baby Blues The postpartum period begins right after the birth of a baby. During this time, there is often a lot of joy and excitement. It is also a time of many changes in the life of the parents. No matter how many times a mother gives birth, each child brings new challenges to the family, including different ways of relating to one another. It is common to have feelings of excitement along with confusing changes in moods, emotions, and thoughts. You may feel happy one minute and sad or stressed the next. These feelings of sadness usually happen in the period right after you have your baby, and they go away within a week or two. This is called the "baby blues." What are the causes? There is no known cause of baby blues. It is likely caused by a combination of factors. However, changes in hormone levels after childbirth are believed to trigger some of the symptoms. Other factors that can play a role in these mood changes include:  Lack of sleep.  Stressful life events, such as poverty, caring for a loved one, or death of a loved one.  Genetics. What are the signs or symptoms? Symptoms of this condition include:  Brief changes in mood, such as going from extreme happiness to sadness.  Decreased concentration.  Difficulty sleeping.  Crying spells and tearfulness.  Loss of appetite.  Irritability.  Anxiety. If the symptoms of baby blues last for more than 2 weeks or become more severe, you may have postpartum depression. How is this diagnosed? This condition is diagnosed based on an evaluation of your symptoms. There are no medical or lab tests that lead to a diagnosis, but there are various questionnaires that a health care provider may use to identify women with the baby blues or postpartum depression. How is this treated? Treatment is not needed for this condition. The baby blues usually go away on their own in 1-2 weeks. Social support is often all that is needed. You will  be encouraged to get adequate sleep and rest. Follow these instructions at home: Lifestyle      Get as much rest as you can. Take a nap when the baby sleeps.  Exercise regularly as told by your health care provider. Some women find yoga and walking to be helpful.  Eat a balanced and nourishing diet. This includes plenty of fruits and vegetables, whole grains, and lean proteins.  Do little things that you enjoy. Have a cup of tea, take a bubble bath, read your favorite magazine, or listen to your favorite music.  Avoid alcohol.  Ask for help with household chores, cooking, grocery shopping, or running errands. Do not try to do everything yourself. Consider hiring a postpartum doula to help. This is a professional who specializes in providing support to new mothers.  Try not to make any major life changes during pregnancy or right after giving birth. This can add stress. General instructions  Talk to people close to you about how you are feeling. Get support from your partner, family members, friends, or other new moms. You may want to join a support group.  Find ways to cope with stress. This may include: ? Writing your thoughts and feelings in a journal. ? Spending time outside. ? Spending time with people who make you laugh.  Try to stay positive in how you think. Think about the things you are grateful for.  Take over-the-counter and prescription medicines only as told by your health care provider.    Let your health care provider know if you have any concerns.  Keep all postpartum visits as told by your health care provider. This is important. Contact a health care provider if:  Your baby blues do not go away after 2 weeks. Get help right away if:  You have thoughts of taking your own life (suicidal thoughts).  You think you may harm the baby or other people.  You see or hear things that are not there (hallucinations). Summary  After giving birth, you may feel happy  one minute and sad or stressed the next. Feelings of sadness that happen right after the baby is born and go away after a week or two are called the "baby blues."  You can manage the baby blues by getting enough rest, eating a healthy diet, exercising, spending time with supportive people, and finding ways to cope with stress.  If feelings of sadness and stress last longer than 2 weeks or get in the way of caring for your baby, talk to your health care provider. This may mean you have postpartum depression. This information is not intended to replace advice given to you by your health care provider. Make sure you discuss any questions you have with your health care provider. Document Released: 06/27/2004 Document Revised: 01/15/2019 Document Reviewed: 11/19/2016 Elsevier Patient Education  [place]. Preeclampsia and Eclampsia Preeclampsia is a serious condition that may develop during pregnancy. This condition causes high blood pressure and increased protein in your urine along with other symptoms, such as headaches and vision changes. These symptoms may develop as the condition gets worse. Preeclampsia may occur at 20 weeks of pregnancy or later. Diagnosing and treating preeclampsia early is very important. If not treated early, it can cause serious problems for you and your baby. One problem it can lead to is eclampsia. Eclampsia is a condition that causes muscle jerking or shaking (convulsions or seizures) and other serious problems for the mother. During pregnancy, delivering your baby may be the best treatment for preeclampsia or eclampsia. For most women, preeclampsia and eclampsia symptoms go away after giving birth. In rare cases, a woman may develop preeclampsia after giving birth (postpartum preeclampsia). This usually occurs within 48 hours after childbirth but may occur up to 6 weeks after giving birth. What are the causes? The cause of preeclampsia is not known. What increases  the risk? The following risk factors make you more likely to develop preeclampsia:  Being pregnant for the first time.  Having had preeclampsia during a past pregnancy.  Having a family history of preeclampsia.  Having high blood pressure.  Being pregnant with more than one baby.  Being 55 or older.  Being African-American.  Having kidney disease or diabetes.  Having medical conditions such as lupus or blood diseases.  Being very overweight (obese). What are the signs or symptoms? The most common symptoms are:  Severe headaches.  Vision problems, such as blurred or double vision.  Abdominal pain, especially upper abdominal pain. Other symptoms that may develop as the condition gets worse include:  Sudden weight gain.  Sudden swelling of the hands, face, legs, and feet.  Severe nausea and vomiting.  Numbness in the face, arms, legs, and feet.  Dizziness.  Urinating less than usual.  Slurred speech.  Convulsions or seizures. How is this diagnosed? There are no screening tests for preeclampsia. Your health care provider will ask you about symptoms and check for signs of preeclampsia during your prenatal visits. You may also have  tests that include:  Checking your blood pressure.  Urine tests to check for protein. Your health care provider will check for this at every prenatal visit.  Blood tests.  Monitoring your baby's heart rate.  Ultrasound. How is this treated? You and your health care provider will determine the treatment approach that is best for you. Treatment may include:  Having more frequent prenatal exams to check for signs of preeclampsia, if you have an increased risk for preeclampsia.  Medicine to lower your blood pressure.  Staying in the hospital, if your condition is severe. There, treatment will focus on controlling your blood pressure and the amount of fluids in your body (fluid retention).  Taking medicine (magnesium sulfate) to  prevent seizures. This may be given as an injection or through an IV.  Taking a low-dose aspirin during your pregnancy.  Delivering your baby early. You may have your labor started with medicine (induced), or you may have a cesarean delivery. Follow these instructions at home: Eating and drinking   Drink enough fluid to keep your urine pale yellow.  Avoid caffeine. Lifestyle  Do not use any products that contain nicotine or tobacco, such as cigarettes and e-cigarettes. If you need help quitting, ask your health care provider.  Do not use alcohol or drugs.  Avoid stress as much as possible. Rest and get plenty of sleep. General instructions  Take over-the-counter and prescription medicines only as told by your health care provider.  When lying down, lie on your left side. This keeps pressure off your major blood vessels.  When sitting or lying down, raise (elevate) your feet. Try putting some pillows underneath your lower legs.  Exercise regularly. Ask your health care provider what kinds of exercise are best for you.  Keep all follow-up and prenatal visits as told by your health care provider. This is important. How is this prevented? There is no known way of preventing preeclampsia or eclampsia from developing. However, to lower your risk of complications and detect problems early:  Get regular prenatal care. Your health care provider may be able to diagnose and treat the condition early.  Maintain a healthy weight. Ask your health care provider for help managing weight gain during pregnancy.  Work with your health care provider to manage any long-term (chronic) health conditions you have, such as diabetes or kidney problems.  You may have tests of your blood pressure and kidney function after giving birth.  Your health care provider may have you take low-dose aspirin during your next pregnancy. Contact a health care provider if:  You have symptoms that your health care  provider told you may require more treatment or monitoring, such as: ? Headaches. ? Nausea or vomiting. ? Abdominal pain. ? Dizziness. ? Light-headedness. Get help right away if:  You have severe: ? Abdominal pain. ? Headaches that do not get better. ? Dizziness. ? Vision problems. ? Confusion. ? Nausea or vomiting.  You have any of the following: ? A seizure. ? Sudden, rapid weight gain. ? Sudden swelling in your hands, ankles, or face. ? Trouble moving any part of your body. ? Numbness in any part of your body. ? Trouble speaking. ? Abnormal bleeding.  You faint. Summary  Preeclampsia is a serious condition that may develop during pregnancy.  This condition causes high blood pressure and increased protein in your urine along with other symptoms, such as headaches and vision changes.  Diagnosing and treating preeclampsia early is very important. If not treated early,  it can cause serious problems for you and your baby.  Get help right away if you have symptoms that your health care provider told you to watch for. This information is not intended to replace advice given to you by your health care provider. Make sure you discuss any questions you have with your health care provider. Document Released: 09/20/2000 Document Revised: 05/26/2018 Document Reviewed: 04/29/2016 Elsevier Patient Education  2020 [employer] Postpartum Care After Vaginal Delivery This sheet gives you information about how to care for yourself from the time you deliver your baby to up to 6-12 weeks after delivery (postpartum period). Your health care provider may also give you more specific instructions. If you have problems or questions, contact your health care provider. Follow these instructions at home: Vaginal bleeding  It is normal to have vaginal bleeding (lochia) after delivery. Wear a sanitary pad for vaginal bleeding and discharge. ? During the first week after delivery, the amount and  appearance of lochia is often similar to a menstrual period. ? Over the next few weeks, it will gradually decrease to a dry, yellow-brown discharge. ? For most women, lochia stops completely by 4-6 weeks after delivery. Vaginal bleeding can vary from woman to woman.  Change your sanitary pads frequently. Watch for any changes in your flow, such as: ? A sudden increase in volume. ? A change in color. ? Large blood clots.  If you pass a blood clot from your vagina, save it and call your health care provider to discuss. Do not flush blood clots down the toilet before talking with your health care provider.  Do not use tampons or douches until your health care provider says this is safe.  If you are not breastfeeding, your period should return 6-8 weeks after delivery. If you are feeding your child breast milk only (exclusive breastfeeding), your period may not return until you stop breastfeeding. Perineal care  Keep the area between the vagina and the anus (perineum) clean and dry as told by your health care provider. Use medicated pads and pain-relieving sprays and creams as directed.  If you had a cut in the perineum (episiotomy) or a tear in the vagina, check the area for signs of infection until you are healed. Check for: ? More redness, swelling, or pain. ? Fluid or blood coming from the cut or tear. ? Warmth. ? Pus or a bad smell.  You may be given a squirt bottle to use instead of wiping to clean the perineum area after you go to the bathroom. As you start healing, you may use the squirt bottle before wiping yourself. Make sure to wipe gently.  To relieve pain caused by an episiotomy, a tear in the vagina, or swollen veins in the anus (hemorrhoids), try taking a warm sitz bath 2-3 times a day. A sitz bath is a warm water bath that is taken while you are sitting down. The water should only come up to your hips and should cover your buttocks. Breast care  Within the first few days  after delivery, your breasts may feel heavy, full, and uncomfortable (breast engorgement). Milk may also leak from your breasts. Your health care provider can suggest ways to help relieve the discomfort. Breast engorgement should go away within a few days.  If you are breastfeeding: ? Wear a bra that supports your breasts and fits you well. ? Keep your nipples clean and dry. Apply creams and ointments as told by your health care provider. ? You  may need to use breast pads to absorb milk that leaks from your breasts. ? You may have uterine contractions every time you breastfeed for up to several weeks after delivery. Uterine contractions help your uterus return to its normal size. ? If you have any problems with breastfeeding, work with your health care provider or Advertising copywriterlactation consultant.  If you are not breastfeeding: ? Avoid touching your breasts a lot. Doing this can make your breasts produce more milk. ? Wear a good-fitting bra and use cold packs to help with swelling. ? Do not squeeze out (express) milk. This causes you to make more milk. Intimacy and sexuality  Ask your health care provider when you can engage in sexual activity. This may depend on: ? Your risk of infection. ? How fast you are healing. ? Your comfort and desire to engage in sexual activity.  You are able to get pregnant after delivery, even if you have not had your period. If desired, talk with your health care provider about methods of birth control (contraception). Medicines  Take over-the-counter and prescription medicines only as told by your health care provider.  If you were prescribed an antibiotic medicine, take it as told by your health care provider. Do not stop taking the antibiotic even if you start to feel better. Activity  Gradually return to your normal activities as told by your health care provider. Ask your health care provider what activities are safe for you.  Rest as much as possible. Try to rest  or take a nap while your baby is sleeping. Eating and drinking   Drink enough fluid to keep your urine pale yellow.  Eat high-fiber foods every day. These may help prevent or relieve constipation. High-fiber foods include: ? Whole grain cereals and breads. ? Brown rice. ? Beans. ? Fresh fruits and vegetables.  Do not try to lose weight quickly by cutting back on calories.  Take your prenatal vitamins until your postpartum checkup or until your health care provider tells you it is okay to stop. Lifestyle  Do not use any products that contain nicotine or tobacco, such as cigarettes and e-cigarettes. If you need help quitting, ask your health care provider.  Do not drink alcohol, especially if you are breastfeeding. General instructions  Keep all follow-up visits for you and your baby as told by your health care provider. Most women visit their health care provider for a postpartum checkup within the first 3-6 weeks after delivery. Contact a health care provider if:  You feel unable to cope with the changes that your child brings to your life, and these feelings do not go away.  You feel unusually sad or worried.  Your breasts become red, painful, or hard.  You have a fever.  You have trouble holding urine or keeping urine from leaking.  You have little or no interest in activities you used to enjoy.  You have not breastfed at all and you have not had a menstrual period for 12 weeks after delivery.  You have stopped breastfeeding and you have not had a menstrual period for 12 weeks after you stopped breastfeeding.  You have questions about caring for yourself or your baby.  You pass a blood clot from your vagina. Get help right away if:  You have chest pain.  You have difficulty breathing.  You have sudden, severe leg pain.  You have severe pain or cramping in your lower abdomen.  You bleed from your vagina so much that you  fill more than one sanitary pad in one  hour. Bleeding should not be heavier than your heaviest period.  You develop a severe headache.  You faint.  You have blurred vision or spots in your vision.  You have bad-smelling vaginal discharge.  You have thoughts about hurting yourself or your baby. If you ever feel like you may hurt yourself or others, or have thoughts about taking your own life, get help right away. You can go to the nearest emergency department or call:  Your local emergency services (911 in the [place]  A suicide crisis helpline, such as the [facility] at (343)001-4311. This is open 24 hours a day. Summary  The period of time right after you deliver your newborn up to 6-12 weeks after delivery is called the postpartum period.  Gradually return to your normal activities as told by your health care provider.  Keep all follow-up visits for you and your baby as told by your health care provider. This information is not intended to replace advice given to you by your health care provider. Make sure you discuss any questions you have with your health care provider. Document Released: 07/21/2007 Document Revised: 09/26/2017 Document Reviewed: 07/07/2017 Elsevier Patient Education  2020 [employer].

## 2019-05-10 ENCOUNTER — Encounter: Payer: BC Managed Care – PPO | Admitting: Advanced Practice Midwife

## 2019-05-12 ENCOUNTER — Ambulatory Visit (INDEPENDENT_AMBULATORY_CARE_PROVIDER_SITE_OTHER): Payer: BC Managed Care – PPO | Admitting: Lactation Services

## 2019-05-12 ENCOUNTER — Other Ambulatory Visit: Payer: Self-pay

## 2019-05-12 DIAGNOSIS — Z0130 Encounter for examination of blood pressure without abnormal findings: Secondary | ICD-10-CM

## 2019-05-12 NOTE — Progress Notes (Signed)
Pt here post vaginal delivery on 7/27. Pt reports she is feeling tired but otherwise. Pt reports occasional headaches upon awakening and notes she is in pain, she is taking ibuprofen as needed. She denies blurred vision or dizziness.   Pt with general concerns about healing and bleeding post delivery. Pt with some back pain that is resolved with Ibuprofen.   Pt with not other questions/concerns at this time.   Pt to follow up with video visit on 8/24.

## 2019-05-20 ENCOUNTER — Telehealth: Payer: Self-pay | Admitting: Medical

## 2019-05-20 NOTE — Telephone Encounter (Signed)
Attempted to call patient about changes made with her visit. Left a message on her voice mailbox.

## 2019-05-21 ENCOUNTER — Telehealth: Payer: Self-pay | Admitting: Medical

## 2019-05-21 ENCOUNTER — Other Ambulatory Visit: Payer: Self-pay

## 2019-05-21 ENCOUNTER — Encounter: Payer: Self-pay | Admitting: Medical

## 2019-05-21 ENCOUNTER — Telehealth (INDEPENDENT_AMBULATORY_CARE_PROVIDER_SITE_OTHER): Payer: BC Managed Care – PPO | Admitting: Medical

## 2019-05-21 DIAGNOSIS — Z1389 Encounter for screening for other disorder: Secondary | ICD-10-CM | POA: Diagnosis not present

## 2019-05-21 DIAGNOSIS — R87610 Atypical squamous cells of undetermined significance on cytologic smear of cervix (ASC-US): Secondary | ICD-10-CM

## 2019-05-21 DIAGNOSIS — Z3009 Encounter for other general counseling and advice on contraception: Secondary | ICD-10-CM

## 2019-05-21 MED ORDER — NORETHINDRONE 0.35 MG PO TABS: 1.0000 | ORAL_TABLET | Freq: Every day | ORAL | 11 refills | Status: DC

## 2019-05-21 NOTE — Progress Notes (Signed)
Patient wants Birth Control Pills for Contraceptive.

## 2019-05-21 NOTE — Progress Notes (Signed)
I connected with Barbara Moore  on 05/21/19 at  9:15 AM EDT by: MyChart video and verified that I am speaking with the correct person using two identifiers.  Patient is located at home and provider is located at home.     The purpose of this virtual visit is to provide medical care while limiting exposure to the novel coronavirus. I discussed the limitations, risks, security and privacy concerns of performing an evaluation and management service by MyChart video and the availability of in person appointments. I also discussed with the patient that there may be a patient responsible charge related to this service. By engaging in this virtual visit, you consent to the provision of healthcare.  Additionally, you authorize for your insurance to be billed for the services provided during this visit.  The patient expressed understanding and agreed to proceed.  The following staff members participated in the virtual visit:  Carver Fila, Tyro Partum Visit Note Subjective:    [name] is a 28 y.o. [id number] female who presents for a postpartum visit. She is 3 weeks postpartum following a spontaneous vaginal delivery. I have fully reviewed the prenatal and intrapartum course. The delivery was at 38.6 gestational weeks. Outcome: spontaneous vaginal delivery. Anesthesia: epidural. Postpartum course has been normal. Baby's course has been normal. Baby is feeding by bottle Dory Horn - Soothe.Still would like to try to breastfeed. Bleeding no bleeding. Bowel function is normal. Bladder function is normal. Patient is not sexually active. Contraception method is abstinence. Postpartum depression screening: negative.  The following portions of the patient's history were reviewed and updated as appropriate: allergies, current medications, past family history, past medical history, past social history, past surgical history and problem list.  Review of Systems Pertinent items are noted in HPI.   Objective:   There were no vitals filed for this visit. Self-Obtained Patient does not have access to her BP cuff now. No concerning symptoms. Will check BP later today and inform us of any abnormal values.        Assessment:   Normal postpartum exam. Pap smear not done at today's visit. Last pap smear 10/2018 and results were ASCUS with +HPV. Needs colposcopy  History of pre-eclampsia   Plan:    1. Contraception: oral progesterone-only contraceptive 2. Follow up in: 3 weeks for Colposcopy or sooner as needed.   Luvenia Redden, PA-C 05/21/2019 9:30 AM

## 2019-05-21 NOTE — Patient Instructions (Signed)
Colposcopy Colposcopy is a procedure to examine the lowest part of the uterus (cervix), the vagina, and the area around the vaginal opening (vulva) for abnormalities or signs of disease. The procedure is done using a lighted microscope or magnifying lens (colposcope). If any unusual cells are found during the procedure, your health care provider may remove a tissue sample for testing (biopsy). A colposcopy may be done if you:  Have an abnormal Pap test. A Pap test is a screening test that is used to check for signs of cancer or infection of the vagina, cervix, and uterus.  Have a Pap smear test in which you test positive for high-risk HPV (human papillomavirus).  Have a sore or lesion on your cervix.  Have genital warts on your vulva, vagina, or cervix.  Took certain medicines while pregnant, such as diethylstilbestrol (DES).  Have pain during sexual intercourse.  Have vaginal bleeding, especially after sexual intercourse.  Need to have a cervical polyp removed.  Need to have a lost intrauterine device (IUD) string located. Let your health care provider know about:  Any allergies you have, including allergies to prescribed medicine, latex, or iodine.  All medicines you are taking, including vitamins, herbs, eye drops, creams, and over-the-counter medicines. Bring a list of all of your medicines to your appointment.  Any problems you or family members have had with anesthetic medicines.  Any blood disorders you have.  Any surgeries you have had.  Any medical conditions you have, such as pelvic inflammatory disease (PID) or endometrial disorder.  Any history of frequent fainting.  Your menstrual cycle and what form of birth control (contraception) you use.  Your medical history, including any prior cervical treatment.  Whether you are pregnant or may be pregnant. What are the risks? Generally, this is a safe procedure. However, problems may occur, including:  Pain.   Infection, which may include a fever, bad-smelling discharge, or pelvic pain.  Bleeding or discharge.  Misdiagnosis.  Fainting and vasovagal reactions, but this is rare.  Allergic reactions to medicines.  Damage to other structures or organs. What happens before the procedure?  If you have your menstrual period or will have it at the time of your procedure, tell your health care provider. A colposcopy typically is not done during menstruation.  Continue your contraceptive practices before and after the procedure.  For 24 hours before the colposcopy: ? Do not douche. ? Do not use tampons. ? Do not use medicines, creams, or suppositories in the vagina. ? Do not have sexual intercourse.  Ask your health care provider about: ? Changing or stopping your regular medicines. This is especially important if you are taking diabetes medicines or blood thinners. ? Taking medicines such as aspirin and ibuprofen. These medicines can thin your blood. Do not take these medicines before your procedure if your health care provider instructs you not to. It is likely that your health care provider will tell you to avoid taking aspirin or medicine that contains aspirin for 7 days before the procedure.  Follow instructions from your health care provider about eating or drinking restrictions. You will likely need to eat a regular diet the day of the procedure and not skip any meals.  You may have an exam or testing. A pregnancy test will be taken on the day of the procedure.  You may have a blood or urine sample taken.  Plan to have someone take you home from the hospital or clinic.  If you will be going   home right after the procedure, plan to have someone with you for 24 hours. What happens during the procedure?  You will lie down on your back, with your feet in foot rests (stirrups).  A warmed and lubricated instrument (speculum) will be inserted into your vagina. The speculum will be used to hold  apart the walls of your vagina so your health care provider can see your cervix and the inside of your vagina.  A cotton swab will be used to place a small amount of liquid solution on the areas to be examined. This solution makes it easier to see abnormal cells. You may feel a slight burning during this part.  The colposcope will be used to scan the cervix with a bright white light. The colposcope will be held near your vulvaand will magnify your vulva, vagina, and cervix for easier examination.  Your health care provider may decide to take a biopsy. If so: ? You may be given medicine to numb the area (local anesthetic). ? Surgical instruments will be used to suck out mucus and cells through your vagina. ? You may feel mild pain while the tissue sample is removed. ? Bleeding may occur. A solution may be used to stop the bleeding. ? If a sample of tissue is needed from the inside of the cervix, a different procedure called endocervical curettage (ECC) may be completed. During this procedure, a curved instrument (curette) will be used to scrape cells from your cervix or the top of your cervix (endocervix).  Your health care provider will record the location of any abnormalities. The procedure may vary among health care providers and hospitals. What happens after the procedure?  You will lie down and rest for a few minutes. You may be offered juice or cookies.  Your blood pressure, heart rate, breathing rate, and blood oxygen level will be monitored until any medicines you were given have worn off.  You may have to wear compression stockings. These stockings help to prevent blood clots and reduce swelling in your legs.  You may have some cramping in your abdomen. This should go away after a few minutes. This information is not intended to replace advice given to you by your health care provider. Make sure you discuss any questions you have with your health care provider. Document Released:  12/14/2002 Document Revised: 09/05/2017 Document Reviewed: 04/29/2016 Elsevier Patient Education  2020 [employer]  

## 2019-05-21 NOTE — Telephone Encounter (Signed)
Attempted to call patient with her colposcopy appointment ( 9/4 @ 8:15). No answer, left voicemail with the appointment information. Reminder mailed

## 2019-05-28 ENCOUNTER — Telehealth: Payer: Self-pay | Admitting: Family Medicine

## 2019-05-28 NOTE — Telephone Encounter (Signed)
Attempted to call patient about her appointment change. New appointment 9/10 @ 3:15. No answer, left voicemail with appointment information. Patient instructed to give the office a call back if she is needing to reschedule.

## 2019-05-31 ENCOUNTER — Telehealth: Payer: BC Managed Care – PPO | Admitting: Advanced Practice Midwife

## 2019-06-11 ENCOUNTER — Ambulatory Visit: Payer: BC Managed Care – PPO | Admitting: Medical

## 2019-06-16 ENCOUNTER — Telehealth: Payer: Self-pay | Admitting: Obstetrics & Gynecology

## 2019-06-16 NOTE — Telephone Encounter (Signed)
Called the patient to confirm the upcoming appointment. Left a detailed voicemail of wearing a face mask, no children or visitors due to covid19 restrictions. Also instructed if the patient has been in contact with someone who has had covid19, if she has been diagnosed with covid19, or experienced any flu-like symptoms we ask that you please call our office to reschedule. °

## 2019-06-17 ENCOUNTER — Ambulatory Visit: Payer: BC Managed Care – PPO | Admitting: Obstetrics and Gynecology

## 2019-07-09 ENCOUNTER — Ambulatory Visit: Payer: BC Managed Care – PPO | Admitting: Obstetrics and Gynecology

## 2019-07-23 ENCOUNTER — Encounter: Payer: Self-pay | Admitting: *Deleted

## 2019-07-26 ENCOUNTER — Telehealth: Payer: Self-pay | Admitting: *Deleted

## 2019-07-26 ENCOUNTER — Ambulatory Visit (INDEPENDENT_AMBULATORY_CARE_PROVIDER_SITE_OTHER)
Admission: RE | Admit: 2019-07-26 | Discharge: 2019-07-26 | Disposition: A | Discharge status: Home / Self Care | Payer: BC Managed Care – PPO | Source: Ambulatory Visit

## 2019-07-26 DIAGNOSIS — L500 Allergic urticaria: Secondary | ICD-10-CM

## 2019-07-26 DIAGNOSIS — L509 Urticaria, unspecified: Secondary | ICD-10-CM

## 2019-07-26 MED ORDER — PREDNISONE 10 MG (21) PO TBPK: ORAL_TABLET | ORAL | 0 refills | Status: DC

## 2019-07-26 MED ORDER — NORELGESTROMIN-ETH ESTRADIOL 150-35 MCG/24HR TD PTWK: 1.0000 | MEDICATED_PATCH | TRANSDERMAL | 12 refills | Status: DC

## 2019-07-26 NOTE — Telephone Encounter (Signed)
I sent a script for Xulane patches, she should use one patch every week for 3 weeks, skip the 4th week. Repeat every four weeks

## 2019-07-26 NOTE — Telephone Encounter (Signed)
Call transferred from registrar. Nataline requests to change from ocp- progesterone only to patch. She states she broke out in hives yesterday. She reports she is not taking enalapril. She is taking only PNV and ortho micronor and has not started any new meds. She states she is breast and bottle feeding. She states she had an E visit with urgent care and they prescribed prednisone pack for her hives. She denies any swelling of her tongue , lips or SOB. We discussed hives could be from pills or many other causes.  I informed her I would need to check with a provider. I discussed with Dr. Darene Lamer and she reviewed chart. She approved patch if patient understands this may interfere with breastfeeding and decrease her milk supply. I explained this to Slater and she states she understands this and still wants to be prescribed patch. I explained the doctor will place prescription. Linda,RN

## 2019-07-26 NOTE — Discharge Instructions (Signed)
Treating you for possible allergic reaction with prednisone Take the medication as prescribed Do not breastfeed for at least 4 hours after taking.  Continue the benadryl and zyrtec as needed.  Follow up with your OB/GYN

## 2019-07-26 NOTE — Telephone Encounter (Signed)
I called Barbara Moore and left a message your prescription you requested has been sent in to your pharmacy. If you have any questions about the prescription instructions- please call our office. Neriah Brott,RN

## 2019-07-26 NOTE — ED Provider Notes (Signed)
Virtual Visit via Video Note:  Marna Weniger  initiated request for Telemedicine visit with [facility] Urgent Care team. I connected with Kerin Salen  on 07/26/2019 at 9:12 PM  for a synchronized telemedicine visit using a video enabled HIPPA compliant telemedicine application. I verified that I am speaking with Kerin Salen  using two identifiers. Orvan July, NP  was physically located in a [facility] Urgent care site and Dashley Monts was located at a different location.   The limitations of evaluation and management by telemedicine as well as the availability of in-person appointments were discussed. Patient was informed that she  may incur a bill ( including co-pay) for this virtual visit encounter. Rashada Klontz  expressed understanding and gave verbal consent to proceed with virtual visit.     History of Present Illness:Kensley Hiers  is a 28 y.o. female presents with rash, itching. This started 2 days ago. She has been using benadryl  and Zyrtec with some relief. Currently breastfeeding and on oral contraceptives.    Denies any fever, joint pain. Denies any recent changes in lotions, detergents, foods or other possible irritants. No recent travel. Nobody else at home has the rash. Patient has been outside but denies any contact with plants or insects. No new foods or medications.   ROS per HPI     Past Medical History:  Diagnosis Date  . Herpes   . Lactose intolerance   . Obesity   . Seasonal allergies   . Sickle cell trait (HCC)     Allergies  Allergen Reactions  . Lactose Intolerance (Gi) Diarrhea and Nausea And Vomiting        Observations/Objective:VITALS: Per patient if applicable, see vitals. GENERAL: Alert, appears well and in no acute distress. HEENT: Atraumatic, conjunctiva clear, no obvious abnormalities on inspection of external nose and ears. NECK: Normal movements of the head and neck. CARDIOPULMONARY: No increased WOB. Speaking in clear sentences. I:E  ratio WNL.  MS: Moves all visible extremities without noticeable abnormality. PSYCH: Pleasant and cooperative, well-groomed. Speech normal rate and rhythm. Affect is appropriate. Insight and judgement are appropriate. Attention is focused, linear, and appropriate.  NEURO: CN grossly intact. Oriented as arrived to appointment on time with no prompting. Moves both UE equally.  SKIN: widespread urticaria seen on camera.      Assessment and Plan: urticaria- treating with prednisone taper and will have her continue the benadryl and Zyrtec.    Follow Up Instructions:Follow up as needed for continued or worsening symptoms     I discussed the assessment and treatment plan with the patient. The patient was provided an opportunity to ask questions and all were answered. The patient agreed with the plan and demonstrated an understanding of the instructions.   The patient was advised to call back or seek an in-person evaluation if the symptoms worsen or if the condition fails to improve as anticipated.     Orvan July, NP  07/26/2019 9:12 PM         Orvan July, NP 07/26/19 2113

## 2019-07-27 ENCOUNTER — Ambulatory Visit: Payer: BC Managed Care – PPO | Admitting: Obstetrics and Gynecology

## 2019-08-11 ENCOUNTER — Telehealth: Payer: Self-pay | Admitting: Family Medicine

## 2019-08-11 NOTE — Telephone Encounter (Signed)
Patient would like a call back she have questions regarding her Birth Control

## 2019-08-11 NOTE — Telephone Encounter (Signed)
Called patient, no answer- left message stating we are trying to reach you to return your phone call, please call us back if you still need assistance.  

## 2019-08-12 ENCOUNTER — Telehealth: Payer: Self-pay | Admitting: Family Medicine

## 2019-08-12 NOTE — Telephone Encounter (Signed)
Called patient, no answer- left message stating we are trying to reach you to return your phone call regarding questions about your birth control please call [place] back if you still have questions or you may also submit them via mychart.

## 2019-08-12 NOTE — Telephone Encounter (Signed)
Patient is returning call. Please call he rback.

## 2019-08-17 ENCOUNTER — Telehealth: Payer: Self-pay | Admitting: Obstetrics and Gynecology

## 2019-08-17 NOTE — Telephone Encounter (Signed)
Left a detailed message about appointment change

## 2019-08-18 ENCOUNTER — Ambulatory Visit: Payer: BC Managed Care – PPO | Admitting: Obstetrics and Gynecology

## 2019-08-19 ENCOUNTER — Telehealth: Payer: Self-pay | Admitting: *Deleted

## 2019-08-19 NOTE — Telephone Encounter (Addendum)
Asked by registrar to call patient to clarify what appointment she needs. Per review sent mychart message for appointmetn with Dr. Rosana Hoes requesting appointment because of " heart issues" stemming from birth control" .  I called Ziaire to discuss and she said she had started the patch and started having tightness in her chest and stopped the patch about a week ago.  Then she said she was getting another call and couldn't talk and ended the call.   Once we talk to her again, need to send message back to admin pool re: appointment.  Linda,RN

## 2019-08-20 NOTE — Telephone Encounter (Signed)
I called Kiahna back and left a message I was calling back to follow up  Her request for appointment- that we had spoke yesterday when our call was disconnected. I asked that if she still needs an appointment to call our office to discuss. Will send mychart message also.  Maddalena Linarez,RN

## 2019-09-17 ENCOUNTER — Ambulatory Visit: Payer: BC Managed Care – PPO | Admitting: Obstetrics and Gynecology

## 2019-09-17 ENCOUNTER — Encounter: Payer: Self-pay | Admitting: Obstetrics and Gynecology

## 2019-11-22 ENCOUNTER — Encounter: Payer: Self-pay | Admitting: General Practice

## 2019-11-22 ENCOUNTER — Other Ambulatory Visit: Payer: Self-pay | Admitting: General Practice

## 2019-11-22 DIAGNOSIS — B009 Herpesviral infection, unspecified: Secondary | ICD-10-CM

## 2019-11-23 MED ORDER — VALACYCLOVIR HCL 1 G PO TABS: 1000.0000 mg | ORAL_TABLET | Freq: Two times a day (BID) | ORAL | 6 refills | Status: AC

## 2019-12-01 ENCOUNTER — Other Ambulatory Visit: Payer: Self-pay

## 2019-12-01 DIAGNOSIS — Z760 Encounter for issue of repeat prescription: Secondary | ICD-10-CM

## 2019-12-01 NOTE — Progress Notes (Signed)
Patient called requesting a refill on Vasotec advised that she needed an primary care and advised that I would send a referral for her to obtain one.

## 2019-12-02 ENCOUNTER — Emergency Department (HOSPITAL_BASED_OUTPATIENT_CLINIC_OR_DEPARTMENT_OTHER)
Admission: EM | Admit: 2019-12-02 | Discharge: 2019-12-02 | Disposition: A | Discharge status: Home / Self Care | Payer: BC Managed Care – PPO | Attending: Emergency Medicine | Admitting: Emergency Medicine

## 2019-12-02 ENCOUNTER — Encounter (HOSPITAL_BASED_OUTPATIENT_CLINIC_OR_DEPARTMENT_OTHER): Payer: Self-pay

## 2019-12-02 ENCOUNTER — Other Ambulatory Visit: Payer: Self-pay

## 2019-12-02 ENCOUNTER — Emergency Department (HOSPITAL_BASED_OUTPATIENT_CLINIC_OR_DEPARTMENT_OTHER): Payer: BC Managed Care – PPO

## 2019-12-02 DIAGNOSIS — R2241 Localized swelling, mass and lump, right lower limb: Secondary | ICD-10-CM | POA: Insufficient documentation

## 2019-12-02 DIAGNOSIS — R0789 Other chest pain: Secondary | ICD-10-CM

## 2019-12-02 DIAGNOSIS — Z79899 Other long term (current) drug therapy: Secondary | ICD-10-CM | POA: Diagnosis not present

## 2019-12-02 DIAGNOSIS — Z793 Long term (current) use of hormonal contraceptives: Secondary | ICD-10-CM | POA: Insufficient documentation

## 2019-12-02 DIAGNOSIS — M25473 Effusion, unspecified ankle: Secondary | ICD-10-CM | POA: Diagnosis not present

## 2019-12-02 DIAGNOSIS — R0602 Shortness of breath: Secondary | ICD-10-CM | POA: Diagnosis not present

## 2019-12-02 DIAGNOSIS — M25471 Effusion, right ankle: Secondary | ICD-10-CM

## 2019-12-02 DIAGNOSIS — R079 Chest pain, unspecified: Secondary | ICD-10-CM | POA: Diagnosis not present

## 2019-12-02 LAB — BASIC METABOLIC PANEL
Anion gap: 8 (ref 5–15)
BUN: 13 mg/dL (ref 6–20)
CO2: 25 mmol/L (ref 22–32)
Calcium: 9.2 mg/dL (ref 8.9–10.3)
Chloride: 105 mmol/L (ref 98–111)
Creatinine, Ser: 0.74 mg/dL (ref 0.44–1.00)
GFR calc Af Amer: >60 mL/min (ref >60)
GFR calc non Af Amer: >60 mL/min (ref >60)
Glucose, Bld: 105 mg/dL — ABNORMAL HIGH (ref 70–99)
Potassium: 4.2 mmol/L (ref 3.5–5.1)
Sodium: 138 mmol/L (ref 135–145)

## 2019-12-02 LAB — TROPONIN I (HIGH SENSITIVITY): Troponin I (High Sensitivity): <2 ng/L (ref <18)

## 2019-12-02 LAB — CBC
HCT: 33.1 % — ABNORMAL LOW (ref 36.0–46.0)
Hemoglobin: 11.1 g/dL — ABNORMAL LOW (ref 12.0–15.0)
MCH: 26.9 pg (ref 26.0–34.0)
MCHC: 33.5 g/dL (ref 30.0–36.0)
MCV: 80.1 fL (ref 80.0–100.0)
Platelets: 405 10*3/uL — ABNORMAL HIGH (ref 150–400)
RBC: 4.13 MIL/uL (ref 3.87–5.11)
RDW: 15.8 % — ABNORMAL HIGH (ref 11.5–15.5)
WBC: 7 10*3/uL (ref 4.0–10.5)
nRBC: 0 % (ref 0.0–0.2)

## 2019-12-02 LAB — D-DIMER, QUANTITATIVE: D-Dimer, Quant: <0.27 ug/mL-FEU (ref 0.00–0.50)

## 2019-12-02 LAB — PREGNANCY, URINE: Preg Test, Ur: NEGATIVE

## 2019-12-02 NOTE — ED Provider Notes (Signed)
[facility] Provider Note   CSN: [id number] Arrival date & time: 12/02/19  1152     History Chief Complaint  Patient presents with  . Numbness  . Chest Pain    Barbara Moore is a 29 y.o. female with history of obesity, HTN, pre-eclampsia who presents with chest pain and numbness.  Patient states that she has been having left-sided chest pain and tightness intermittently for some time.  Timeframe is difficult for her to estimate but is been going on for maybe 1 week to several months.  When she has the chest tightness she report multiple other symptoms including headache, dizziness, blurry vision, left arm numbness, shortness of breath, and nausea.  She feels stressed and anxious because she has a 32-month-old and her mother had a severe Covid infection and was on a ventilator.  Her mother is currently getting out of rehab this weekend and is going to come live with her and she will be her caregiver.  She is also been having problems with right ankle pain and swelling.  She thought that maybe it was due to a sprain however its been persistent.  She has gotten back onto enalapril which she was on after she gave birth from preeclampsia and states that the swelling has improved after she restarted the blood pressure medicine.  She denies fever, syncope, persistent symptoms, abdominal pain, vomiting, diarrhea, urinary symptoms.  No history of DVT or PE.  She is not on birth control. She was concerned she was having a heart attack so came to the ED today.  HPI     Past Medical History:  Diagnosis Date  . Herpes   . Lactose intolerance   . Obesity   . Seasonal allergies   . Sickle cell trait [facility])     Patient Active Problem List   Diagnosis Date Noted  . Postpartum anemia 05/03/2019  . Postpartum hemorrhage 05/03/2019  . Severe preeclampsia, delivered 05/01/2019  . Abnormal Pap smear of cervix 11/11/2018  . BMI 45.0-49.9, adult ([place]) 10/28/2018  . HSV-2  infection 10/23/2018    Past Surgical History:  Procedure Laterality Date  . NO PAST SURGERIES       OB History    Gravida  1   Para  1   Term  1   Preterm      AB      Living  1     SAB      TAB      Ectopic      Multiple  0   Live Births  1           Family History  Problem Relation Age of Onset  . Diabetes Mother   . Heart failure Mother   . Healthy Father   . Learning disabilities Sister     Social History   Tobacco Use  . Smoking status: Never Smoker  . Smokeless tobacco: Never Used  Substance Use Topics  . Alcohol use: Not Currently  . Drug use: No    Home Medications Prior to Admission medications   Medication Sig Start Date End Date Taking? Authorizing Provider  enalapril (VASOTEC) 10 MG tablet Take 1 tablet (10 mg total) by mouth daily. 05/05/19   Chancy Milroy, MD  ibuprofen (ADVIL) 600 MG tablet Take 1 tablet (600 mg total) by mouth every 6 (six) hours. 05/04/19   Chancy Milroy, MD  norelgestromin-ethinyl estradiol (ORTHO EVRA) 150-35 MCG/24HR transdermal patch Place 1 patch onto  the skin once a week. 1 patch weekly for 3 weeks, skip 4th week. Repeat every 4 weeks 07/26/19   Sparacino, Hailey L, DO  norethindrone (ORTHO MICRONOR) 0.35 MG tablet Take 1 tablet (0.35 mg total) by mouth daily. 05/21/19   Marny Lowenstein, PA-C  predniSONE (STERAPRED UNI-PAK 21 TAB) 10 MG (21) TBPK tablet 6 tabs for 1 day, then 5 tabs for 1 das, then 4 tabs for 1 day, then 3 tabs for 1 day, 2 tabs for 1 day, then 1 tab for 1 day 07/26/19   Dahlia Byes A, NP  Prenatal Vit-Fe Fum-FA-Omega (PNV PRENATAL PLUS MULTIVIT+DHA PO) Take 1 capsule by mouth daily.    [provider]    Allergies    Lactose intolerance (gi)  Review of Systems   Review of Systems  Constitutional: Negative for fever.  Eyes: Positive for visual disturbance.  Respiratory: Positive for chest tightness and shortness of breath. Negative for cough.   Cardiovascular: Positive for  chest pain and leg swelling. Negative for palpitations.  Gastrointestinal: Positive for nausea. Negative for abdominal pain and vomiting.  Genitourinary: Negative for dysuria.  Neurological: Positive for dizziness and headaches. Negative for syncope.  Psychiatric/Behavioral: The patient is nervous/anxious.   All other systems reviewed and are negative.   Physical Exam Updated Vital Signs BP 124/69 (BP Location: Left Arm)   Pulse 94   Temp 98 F (36.7 C) (Oral)   Resp 18   Ht 5\' 8"  (1.727 m)   Wt (!) 145.2 kg   LMP 11/25/2019   SpO2 99%   BMI 48.66 kg/m   Physical Exam Vitals and nursing note reviewed.  Constitutional:      General: She is not in acute distress.    Appearance: Normal appearance. She is well-developed. She is obese. She is not ill-appearing.     Comments: Cooperative. Mildly anxious  HENT:     Head: Normocephalic and atraumatic.  Eyes:     General: No scleral icterus.       Right eye: No discharge.        Left eye: No discharge.     Conjunctiva/sclera: Conjunctivae normal.     Pupils: Pupils are equal, round, and reactive to light.  Cardiovascular:     Rate and Rhythm: Normal rate and regular rhythm.  Pulmonary:     Effort: Pulmonary effort is normal. No respiratory distress.     Breath sounds: Normal breath sounds.  Abdominal:     General: There is no distension.     Palpations: Abdomen is soft.     Tenderness: There is no abdominal tenderness.  Musculoskeletal:     Cervical back: Normal range of motion.     Comments: No significant lower extremity edema  Skin:    General: Skin is warm and dry.  Neurological:     Mental Status: She is alert and oriented to person, place, and time.     Comments: Lying on stretcher in NAD. GCS 15. Speaks in a clear voice. Cranial nerves II through XII grossly intact. 5/5 strength in all extremities. Sensation fully intact.  Bilateral finger-to-nose intact. Ambulatory    Psychiatric:        Mood and Affect: Mood is  anxious.        Behavior: Behavior normal.     ED Results / Procedures / Treatments   Labs (all labs ordered are listed, but only abnormal results are displayed) Labs Reviewed  BASIC METABOLIC PANEL - Abnormal; Notable for the following  components:      Result Value   Glucose, Bld 105 (*)    All other components within normal limits  CBC - Abnormal; Notable for the following components:   Hemoglobin 11.1 (*)    HCT 33.1 (*)    RDW 15.8 (*)    Platelets 405 (*)    All other components within normal limits  D-DIMER, QUANTITATIVE (NOT AT [facility])  PREGNANCY, URINE  TROPONIN I (HIGH SENSITIVITY)  TROPONIN I (HIGH SENSITIVITY)    EKG EKG Interpretation  Date/Time:  Thursday December 02 2019 12:00:18 EST Ventricular Rate:  97 PR Interval:  146 QRS Duration: 86 QT Interval:  346 QTC Calculation: 439 R Axis:   79 Text Interpretation: Normal sinus rhythm Nonspecific T wave abnormality Confirmed by Virgina Norfolk [phone]) on 12/02/2019 12:11:16 PM   Radiology DG Chest 2 View  Result Date: 12/02/2019 CLINICAL DATA:  Chest pain and shortness of breath for 1 week EXAM: CHEST - 2 VIEW COMPARISON:  10/05/2016 FINDINGS: Upper normal heart size. Mediastinal contours and pulmonary vascularity normal. Lungs clear. No pulmonary infiltrate, pleural effusion or pneumothorax. Osseous structures unremarkable. IMPRESSION: No acute abnormalities. Electronically Signed   By: Ulyses Southward M.D.   On: 12/02/2019 13:09    Procedures Procedures (including critical care time)  Medications Ordered in ED Medications - No data to display  ED Course  I have reviewed the triage vital signs and the nursing notes.  Pertinent labs & imaging results that were available during my care of the patient were reviewed by me and considered in my medical decision making (see chart for details).  29 year old female presents with multiple complaints.  Symptoms seem to be chronic, intermittent, and stress-induced.  Her  vital signs are normal here.  EKG is sinus rhythm.  Will obtain labs, chest x-ray.  CBC is remarkable for mild anemia which is improved.  BMP is normal.  Troponin is normal.  D-dimer is negative.  Do not think we need a second since pain is chronic in nature.  Pain is very atypical and I think again it is likely stress-induced.  Offered prescription for hydroxyzine however patient declined and will follow up with her PCP.  MDM Rules/Calculators/A&P                       Final Clinical Impression(s) / ED Diagnoses Final diagnoses:  Atypical chest pain  Right ankle swelling    Rx / DC Orders ED Discharge Orders    None       Bethel Born, PA-C 12/02/19 1509    Virgina Norfolk, DO 12/03/19 [id number]

## 2019-12-02 NOTE — ED Notes (Signed)
ED Provider at bedside. 

## 2019-12-02 NOTE — ED Notes (Signed)
Discharged by Arletha Grippe RN

## 2019-12-02 NOTE — ED Triage Notes (Signed)
Pt c/o numbness to left UE x 1 week-CP x 2-3 days-states she noncompliant with BP meds-NAD-steady gait

## 2019-12-02 NOTE — Discharge Instructions (Signed)
Please follow up with your doctor Return to the ED if you are worsening

## 2020-03-16 DIAGNOSIS — Z03818 Encounter for observation for suspected exposure to other biological agents ruled out: Secondary | ICD-10-CM | POA: Diagnosis not present

## 2020-03-16 DIAGNOSIS — J22 Unspecified acute lower respiratory infection: Secondary | ICD-10-CM | POA: Diagnosis not present

## 2020-06-21 ENCOUNTER — Ambulatory Visit: Payer: BC Managed Care – PPO

## 2020-06-21 DIAGNOSIS — R399 Unspecified symptoms and signs involving the genitourinary system: Secondary | ICD-10-CM | POA: Diagnosis not present

## 2020-07-14 IMAGING — US US MFM OB DETAIL +14 WK
1 series · 14 of 28 positions shown · non-contrast
Comparison: none

[Series 1: us mfm ob detail +14 wk · 117 acquisitions, 14 frames shown]
[im 5/117]
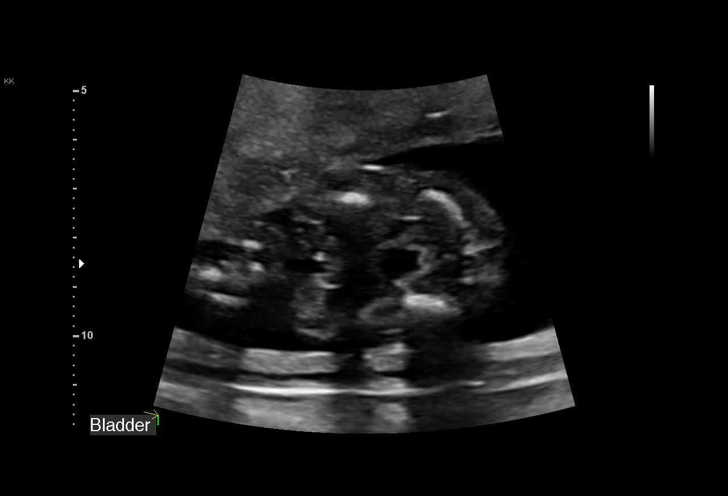
[im 13/117]
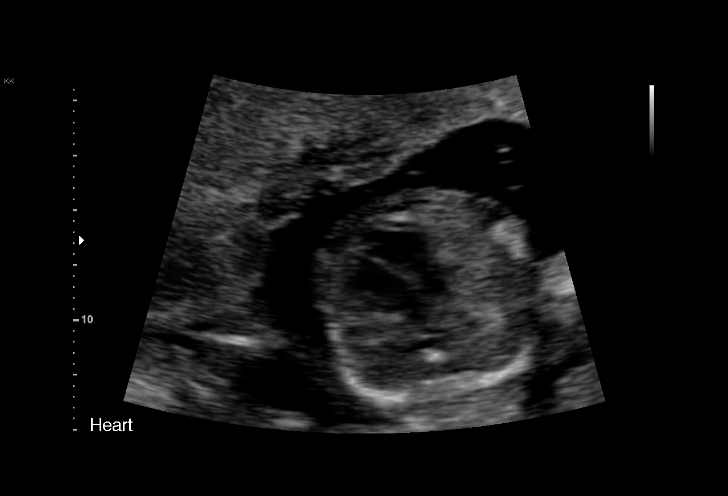
[im 22/117]
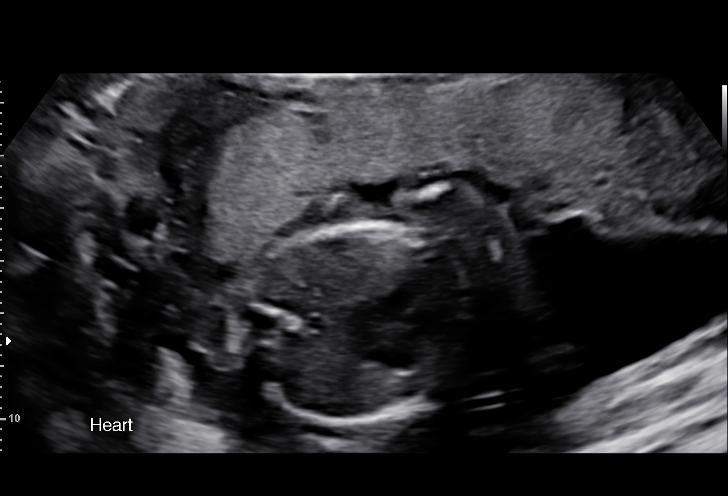
[im 31/117]
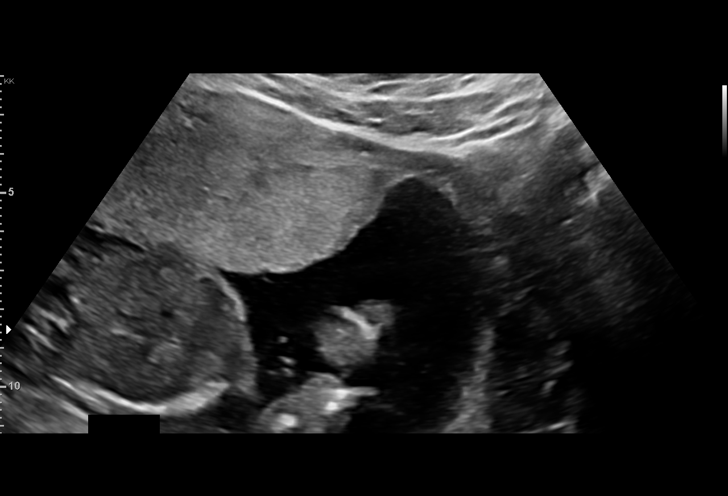
[im 39/117]
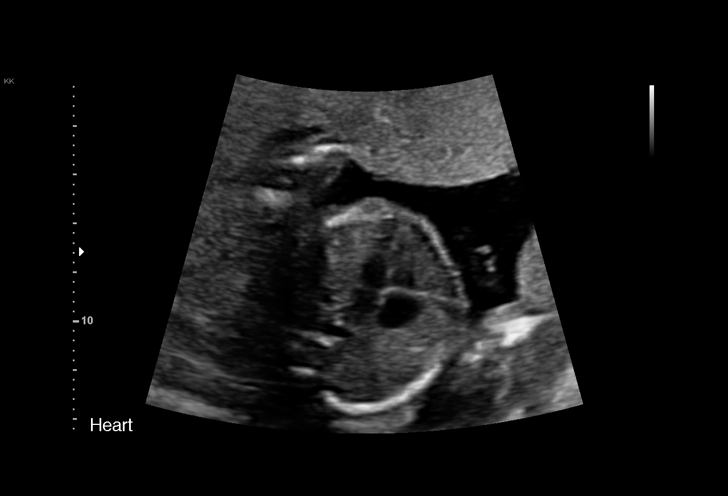
[im 48/117]
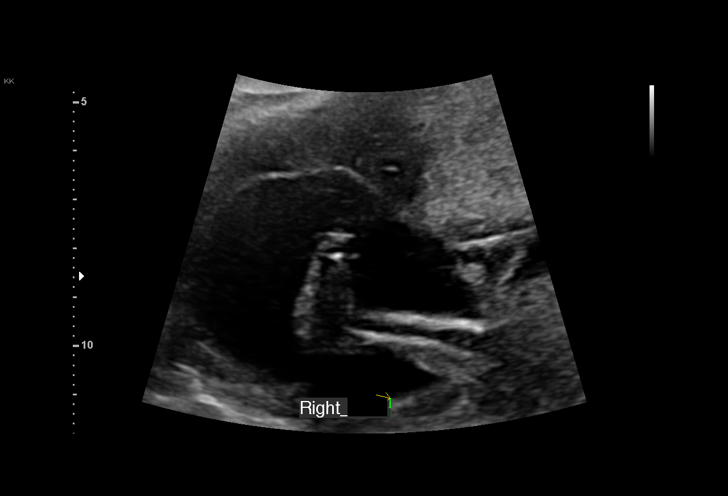
[im 56/117]
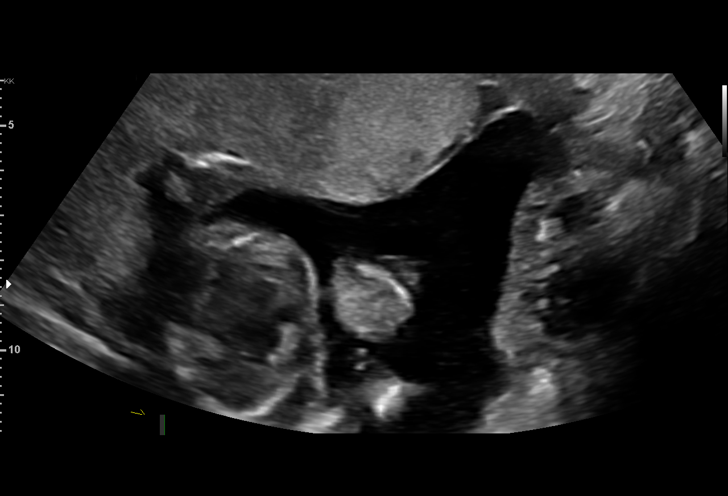
[im 65/117]
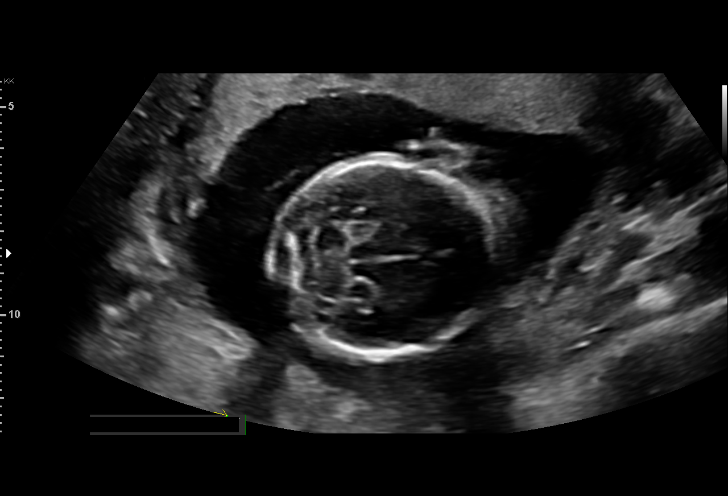
[im 74/117]
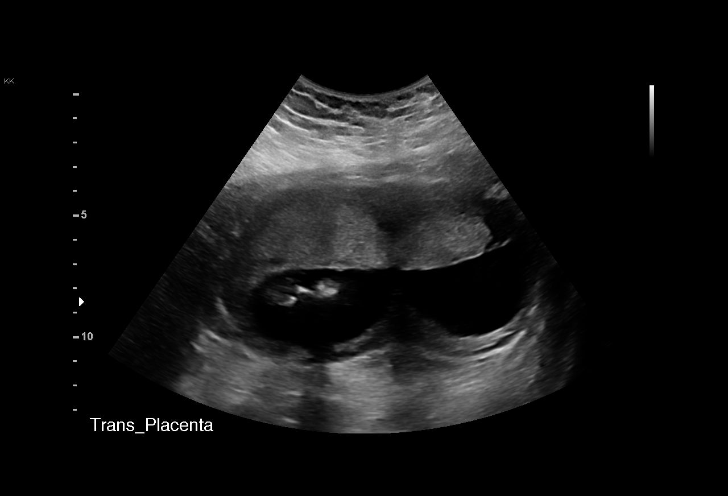
[im 82/117]
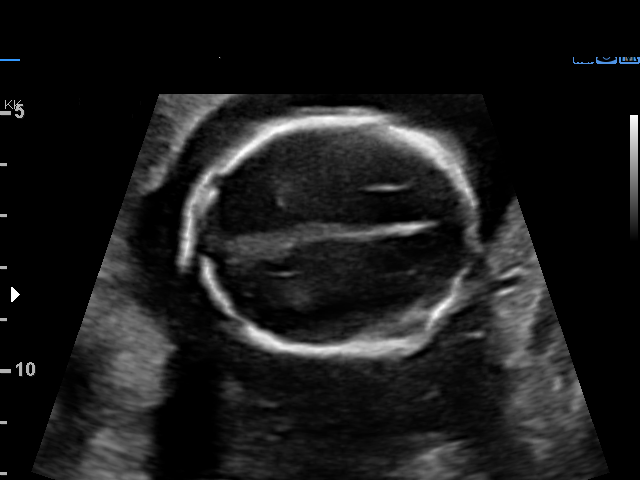
[im 91/117]
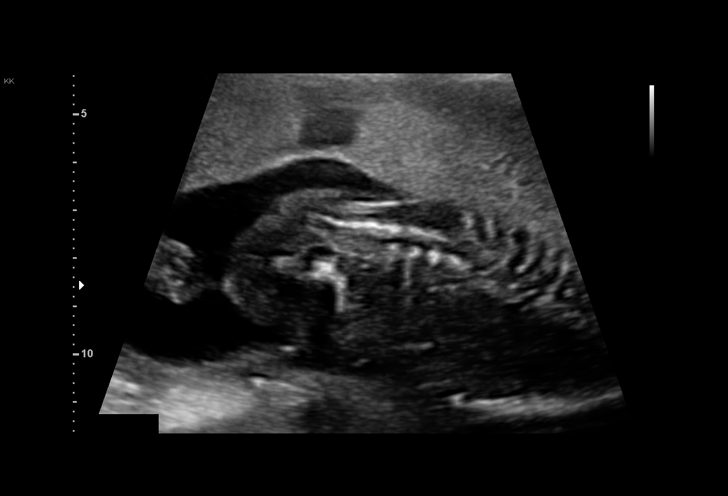
[im 99/117]
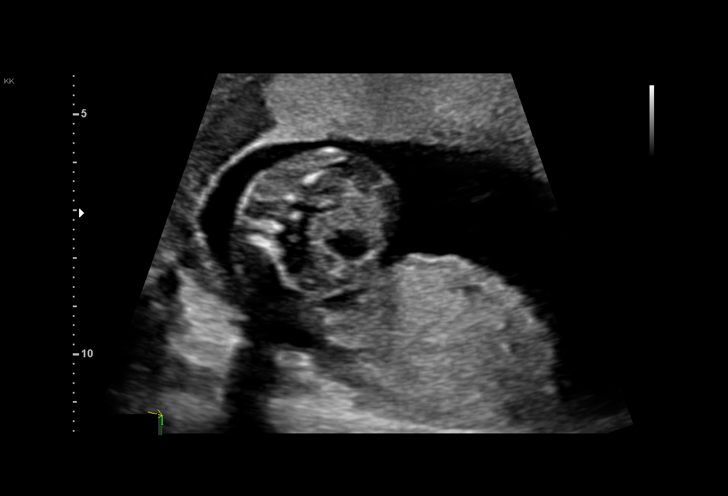
[im 108/117]
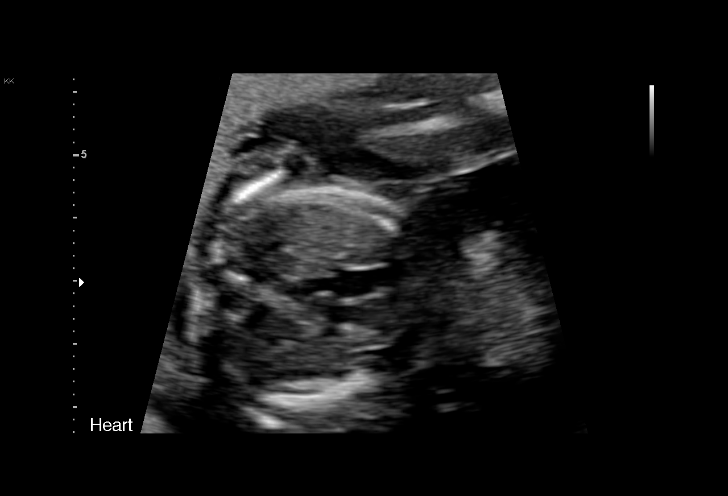
[im 117/117]
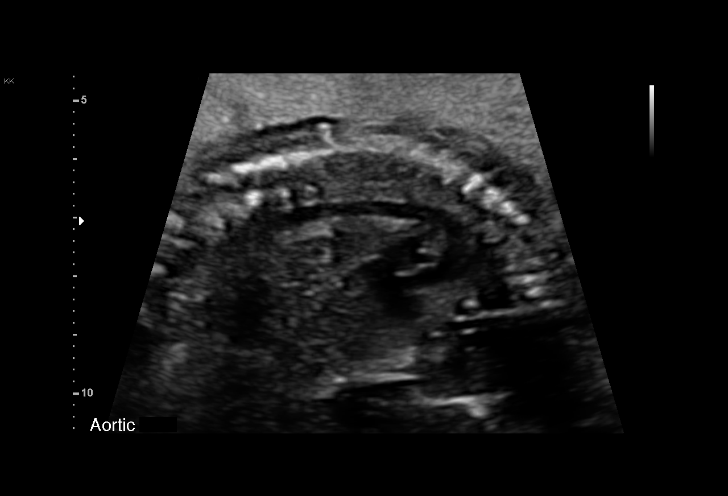

[14 of 28 positions shown; findings below may reference images not displayed]

OB/Gyn Clinic

 ----------------------------------------------------------------------

 ----------------------------------------------------------------------
Indications

  Obesity complicating pregnancy, second
  trimester(BMI 46)(AFP Negative
  Encounter for antenatal screening for
  malformations
  19 weeks gestation of pregnancy
 ----------------------------------------------------------------------
Vital Signs

 BMI:
Fetal Evaluation

 Num Of Fetuses:          1
 Fetal Heart Rate(bpm):   157
 Cardiac Activity:        Observed
 Presentation:            Cephalic
 Placenta:                Anterior
 P. Cord Insertion:       Visualized

 Amniotic Fluid
 AFI FV:      Within normal limits

                             Largest Pocket(cm)

Biometry
 BPD:      46.3  mm     G. Age:  20w 0d         79  %    CI:        75.63   %    70 - 86
                                                         FL/HC:       20.7  %    16.1 -
 HC:      168.8  mm     G. Age:  19w 4d         54  %    HC/AC:       1.07       1.09 -
 AC:      157.2  mm     G. Age:  20w 6d         89  %    FL/BPD:      75.6  %
 FL:         35  mm     G. Age:  21w 0d         92  %    FL/AC:       22.3  %    20 - 24
 HUM:      32.3  mm     G. Age:  20w 6d         92  %
 NFT:       4.2  mm

 Est. FW:     376   gm   0 lb 13 oz      67  %
OB History

 Gravidity:    1
Gestational Age

 LMP:           19w 2d        Date:  08/02/18                 EDD:   05/09/19
 U/S Today:     20w 3d                                        EDD:   05/01/19
 Best:          19w 2d     Det. By:  LMP  (08/02/18)          EDD:   05/09/19
Anatomy

 Cranium:               Appears normal         Aortic Arch:            Appears normal
 Cavum:                 Appears normal         Ductal Arch:            Not well visualized
 Ventricles:            Appears normal         Diaphragm:              Appears normal
 Choroid Plexus:        Appears normal         Stomach:                Appears normal, left
                                                                       sided
 Cerebellum:            Appears normal         Abdomen:                Appears normal
 Posterior Fossa:       Appears normal         Abdominal Wall:         Not well visualized
 Nuchal Fold:           Appears normal         Cord Vessels:           Appears normal (3
                                                                       vessel cord)
 Face:                  Appears normal         Kidneys:                Appear normal
                        (orbits and profile)
 Lips:                  Appears normal         Bladder:                Appears normal
 Thoracic:              Appears normal         Spine:                  Not well visualized
 Heart:                 Appears normal         Upper Extremities:      Appears normal
                        (4CH, axis, and
                        situs)
 RVOT:                  Not well visualized    Lower Extremities:      Appears normal
 LVOT:                  Not well visualized

 Other:  Technically difficult due to maternal habitus and fetal position.
Cervix Uterus Adnexa

 Cervix
 Length:            3.2  cm.
 Normal appearance by transabdominal scan.
Impression

 Normal interval growth.
 Suboptimal views again seen secondary to fetal position.
Recommendations

 Follow up anatomy in 4 weeks.

## 2020-08-28 DIAGNOSIS — F339 Major depressive disorder, recurrent, unspecified: Secondary | ICD-10-CM | POA: Diagnosis not present

## 2020-08-28 DIAGNOSIS — Z9189 Other specified personal risk factors, not elsewhere classified: Secondary | ICD-10-CM | POA: Diagnosis not present

## 2020-09-01 IMAGING — US US MFM OB FOLLOW UP
1 series · 14 of 28 positions shown · non-contrast
Comparison: none

[Series 1: us mfm ob follow up · 14 of 72 slices shown]
[im 3/72]
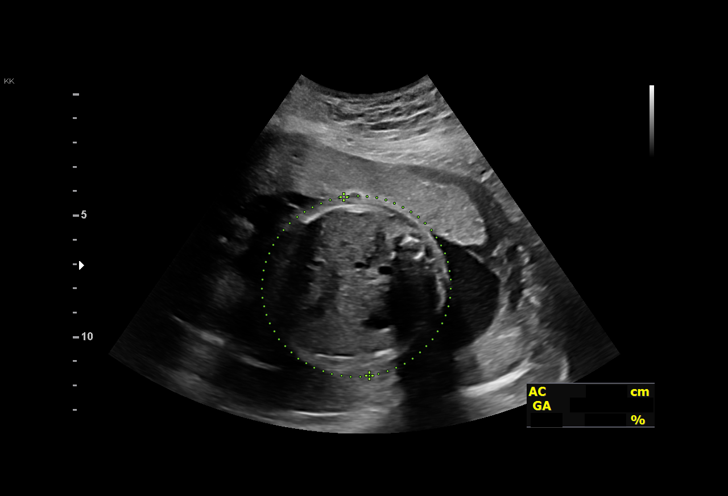
[im 8/72]
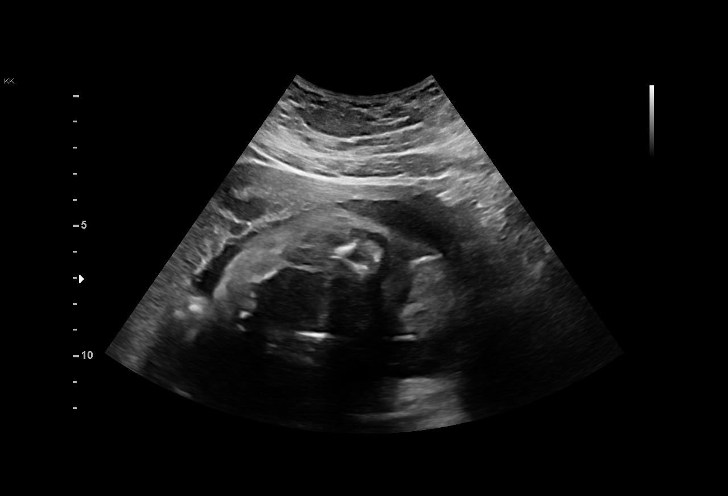
[im 14/72]
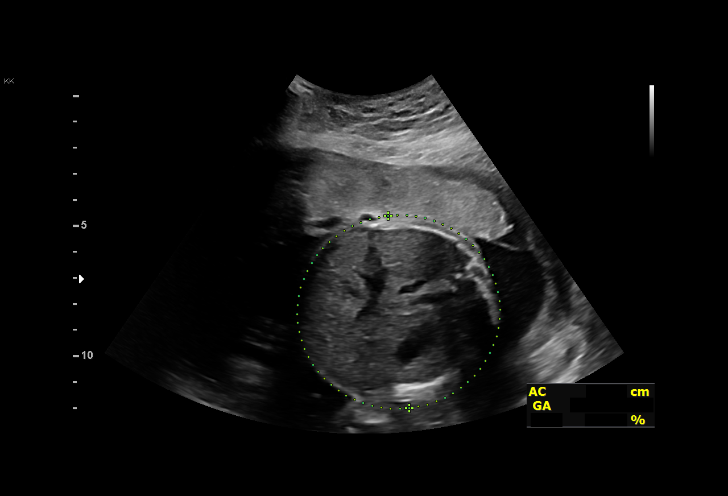
[im 19/72]
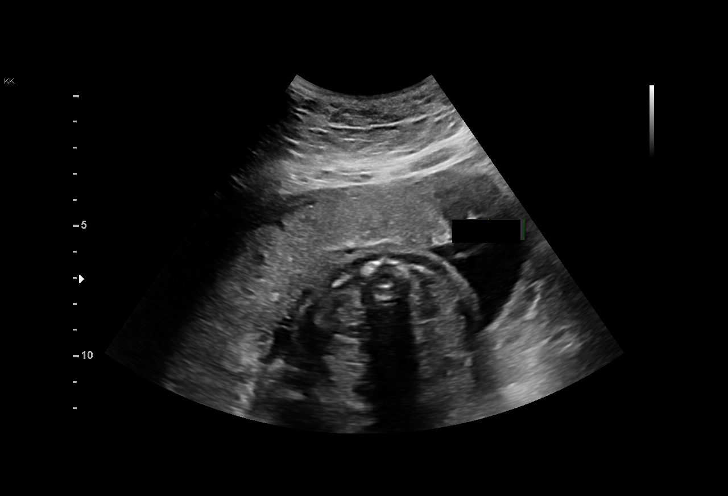
[im 24/72]
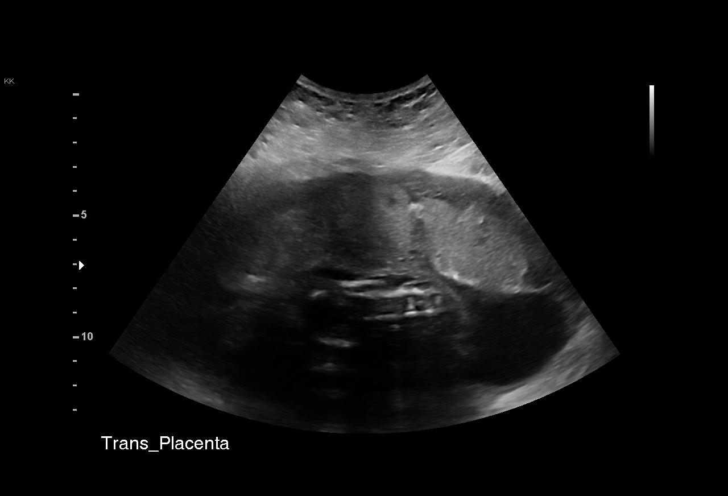
[im 29/72]
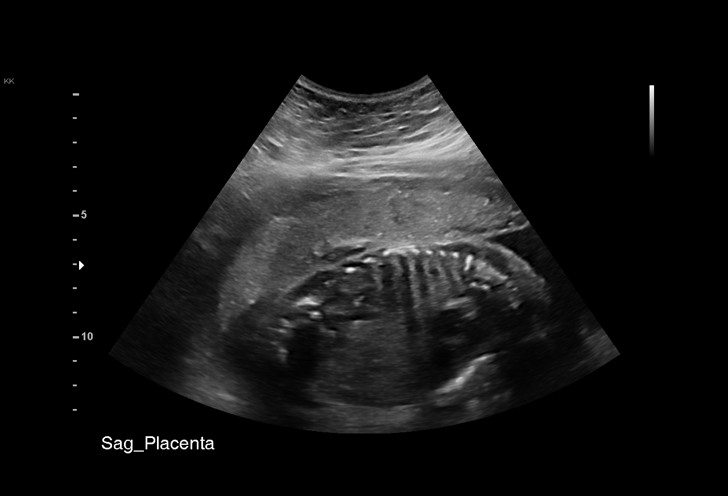
[im 35/72]
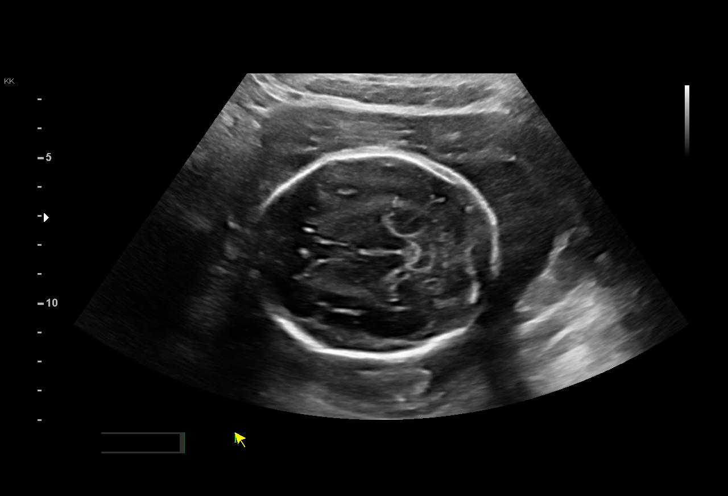
[im 40/72]
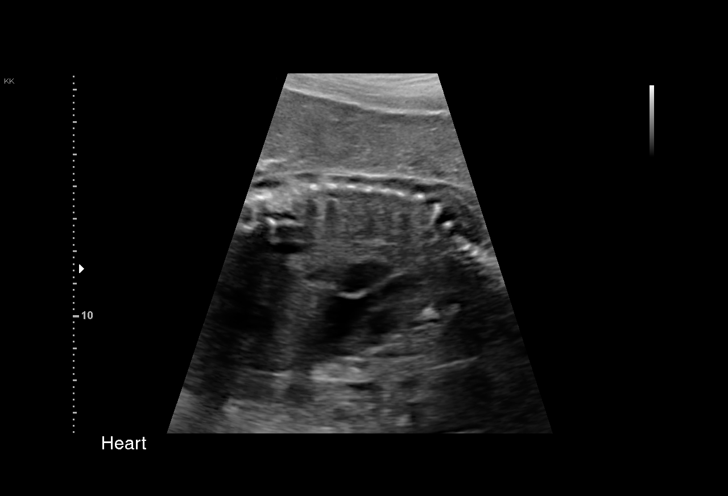
[im 45/72]
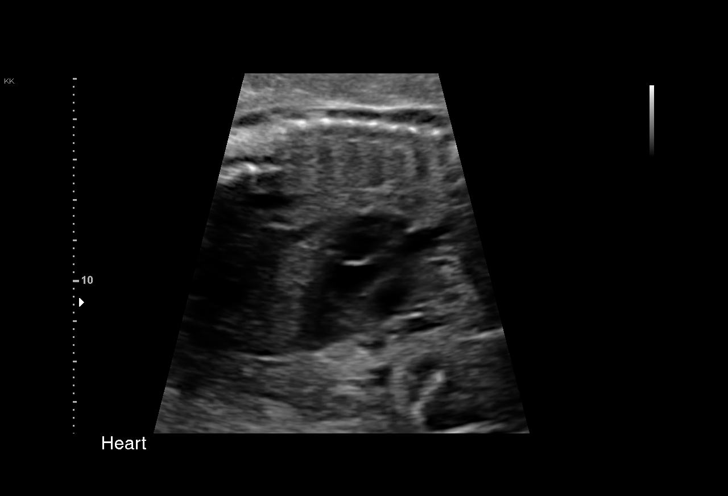
[im 50/72]
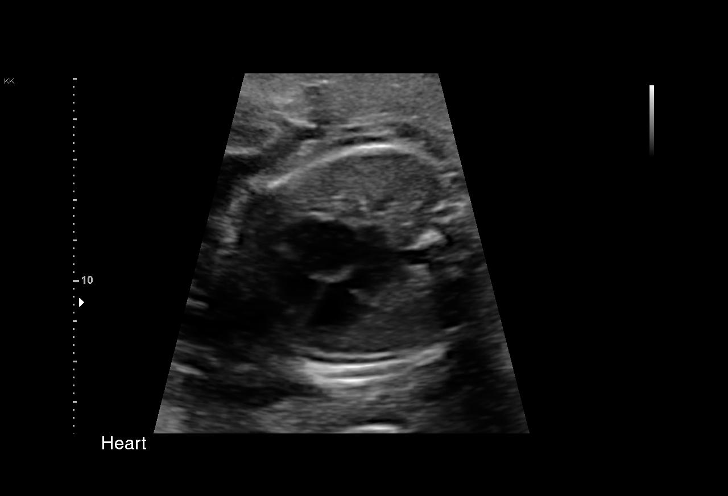
[im 56/72]
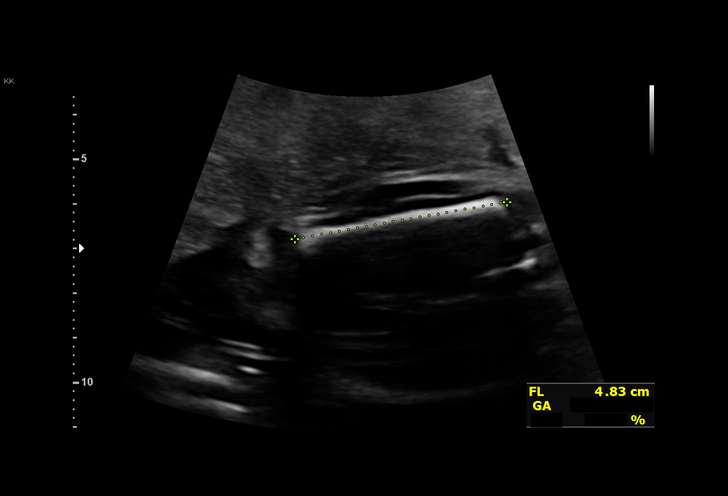
[im 61/72]
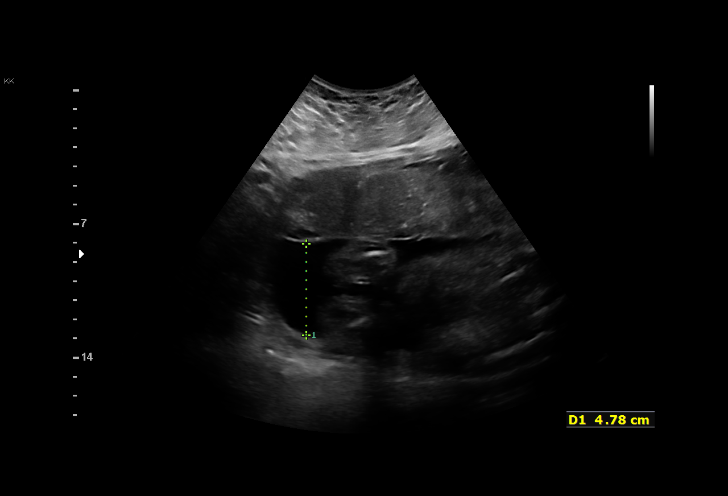
[im 66/72]
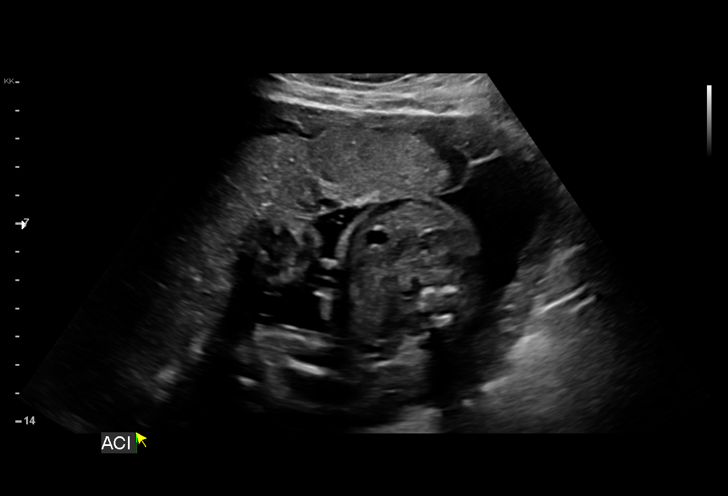
[im 72/72]
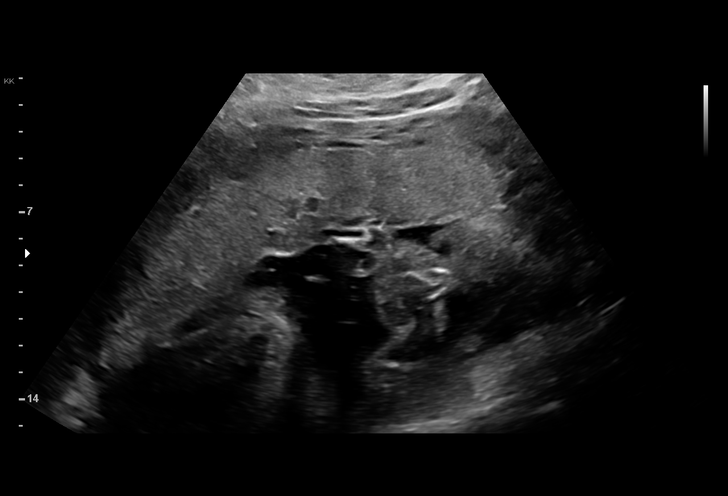

[14 of 28 positions shown; findings below may reference images not displayed]

[address]

                                                       ANDOLINI
 ----------------------------------------------------------------------

 ----------------------------------------------------------------------
Indications

  Obesity complicating pregnancy, second
  trimester(BMI 46)(AFP Negative
  Encounter for other antenatal screening
  follow-up
  26 weeks gestation of pregnancy
 ----------------------------------------------------------------------
Vital Signs

 BMI:
Fetal Evaluation

 Num Of Fetuses:         1
 Fetal Heart Rate(bpm):  148
 Cardiac Activity:       Observed
 Presentation:           Cephalic
 Placenta:               Anterior
 P. Cord Insertion:      Previously Visualized

 Amniotic Fluid
 AFI FV:      Within normal limits

                             Largest Pocket(cm)

Biometry

 BPD:        70  mm     G. Age:  28w 1d         91  %    CI:        74.91   %    70 - 86
                                                         FL/HC:      18.9   %    18.6 -
 HC:      256.6  mm     G. Age:  27w 6d         77  %    HC/AC:      1.07        1.04 -
 AC:      238.9  mm     G. Age:  28w 1d         90  %    FL/BPD:     69.4   %    71 - 87
 FL:       48.6  mm     G. Age:  26w 2d         37  %    FL/AC:      20.3   %    20 - 24
 Est. FW:    9277  gm      2 lb 6 oz     76  %
OB History

 Gravidity:    1
Gestational Age

 LMP:           26w 2d        Date:  08/02/18                 EDD:   05/09/19
 U/S Today:     27w 4d                                        EDD:   04/30/19
 Best:          26w 2d     Det. By:  LMP  (08/02/18)          EDD:   05/09/19
Anatomy

 Cranium:               Appears normal         Aortic Arch:            Previously seen
 Cavum:                 Appears normal         Ductal Arch:            Not well visualized
 Ventricles:            Appears normal         Diaphragm:              Previously seen
 Choroid Plexus:        Previously seen        Stomach:                Appears normal, left
                                                                       sided
 Cerebellum:            Appears normal         Abdomen:                Appears normal
 Posterior Fossa:       Appears normal         Abdominal Wall:         Appears nml (cord
                                                                       insert, abd wall)
 Nuchal Fold:           Previously seen        Cord Vessels:           Previously seen
 Face:                  Orbits and profile     Kidneys:                Appear normal
                        previously seen
 Lips:                  Appears normal         Bladder:                Appears normal
 Thoracic:              Appears normal         Spine:                  Appears normal
 Heart:                 Appears normal         Upper Extremities:      Previously seen
                        (4CH, axis, and
                        situs)
 RVOT:                  Appears normal         Lower Extremities:      Previously seen
 LVOT:                  Appears normal

 Other:  Technically difficult due to maternal habitus and fetal position. Male
         gender.
Cervix Uterus Adnexa

 Cervix
 Not visualized (advanced GA >07wks)
Impression

 Normal interval growth.
Recommendations

 Follow up growth in 6 weeks given BMI

## 2020-09-05 DIAGNOSIS — F339 Major depressive disorder, recurrent, unspecified: Secondary | ICD-10-CM | POA: Diagnosis not present

## 2020-09-05 DIAGNOSIS — Z9189 Other specified personal risk factors, not elsewhere classified: Secondary | ICD-10-CM | POA: Diagnosis not present

## 2020-11-10 IMAGING — US US MFM OB FOLLOW UP
1 series · 14 of 27 positions shown · non-contrast
Comparison: none

[Series 1: us mfm ob follow up · 14 of 27 slices shown]
[im 1/27]
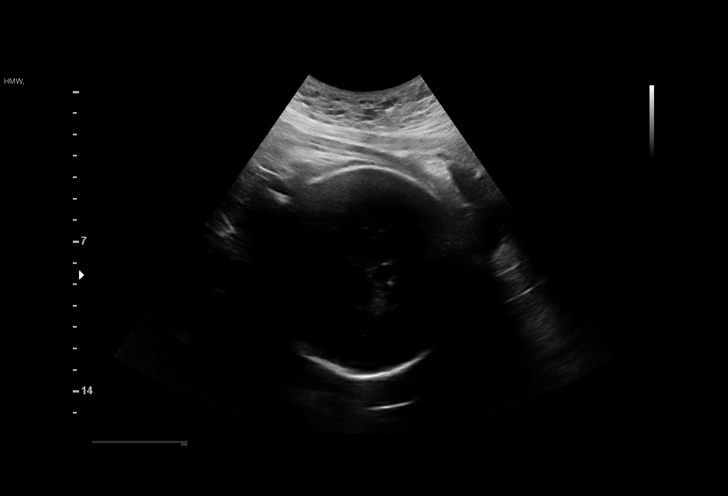
[im 3/27]
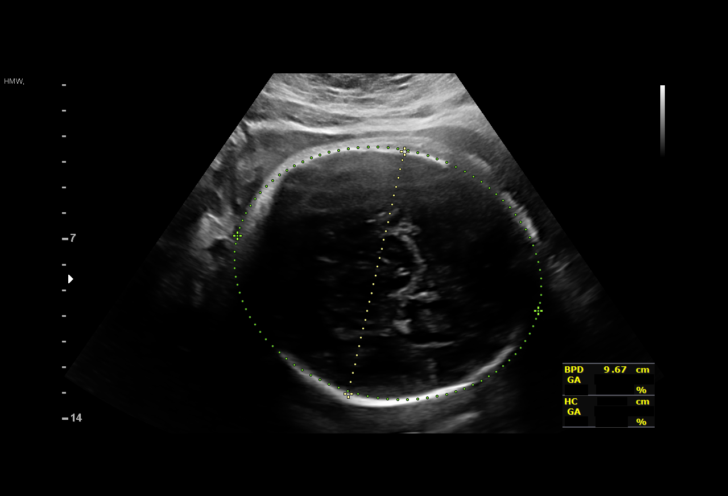
[im 5/27]
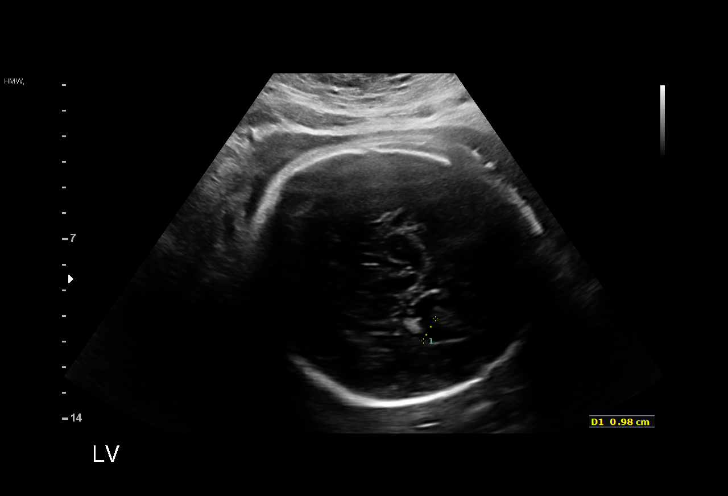
[im 7/27]
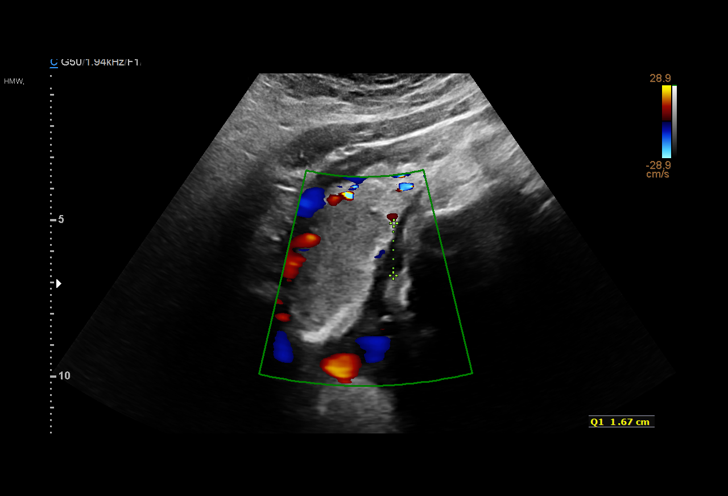
[im 9/27]
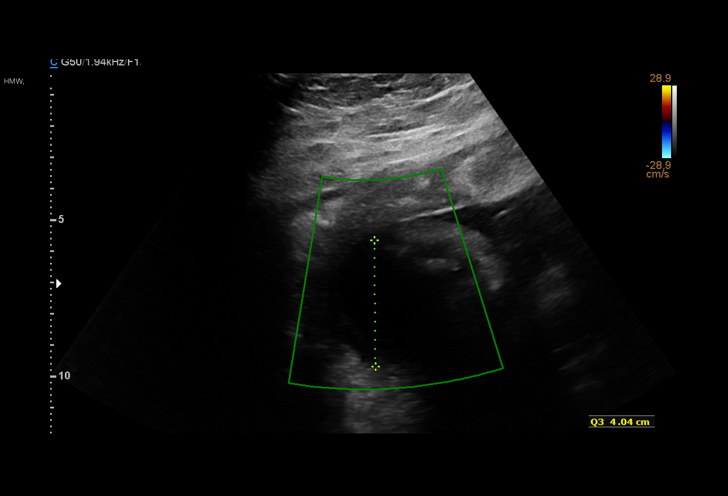
[im 11/27]
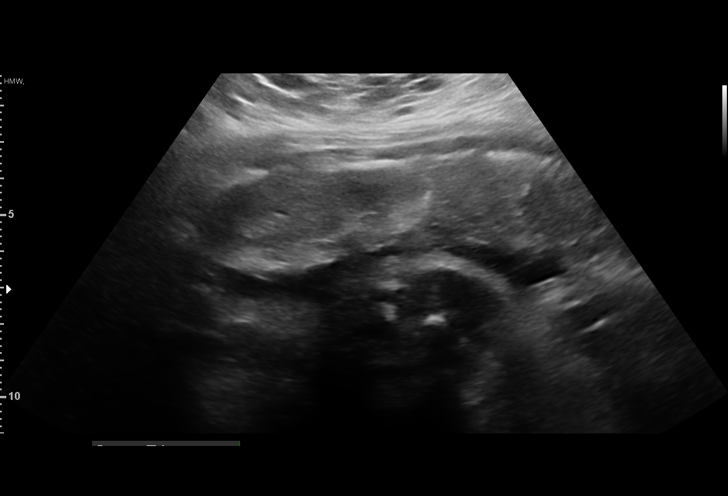
[im 13/27]
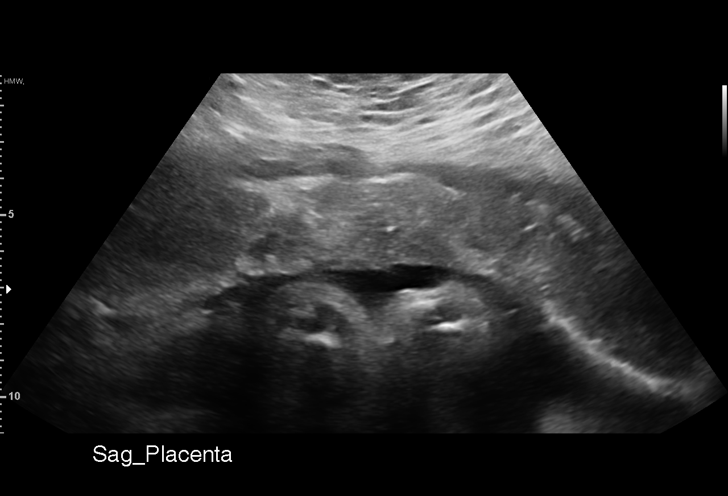
[im 15/27]
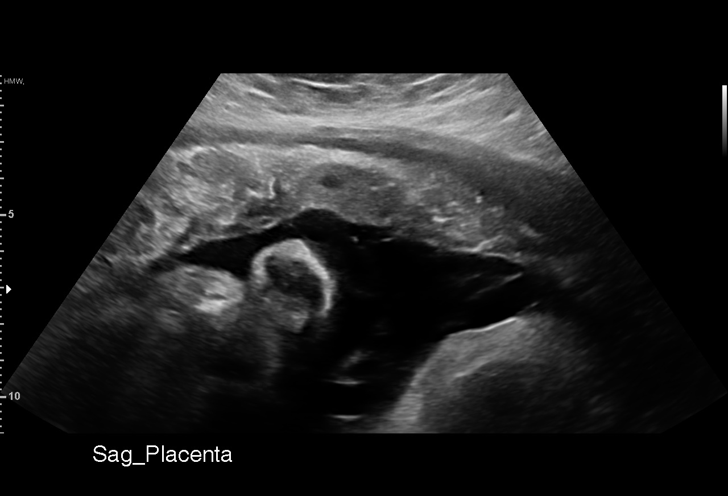
[im 17/27]
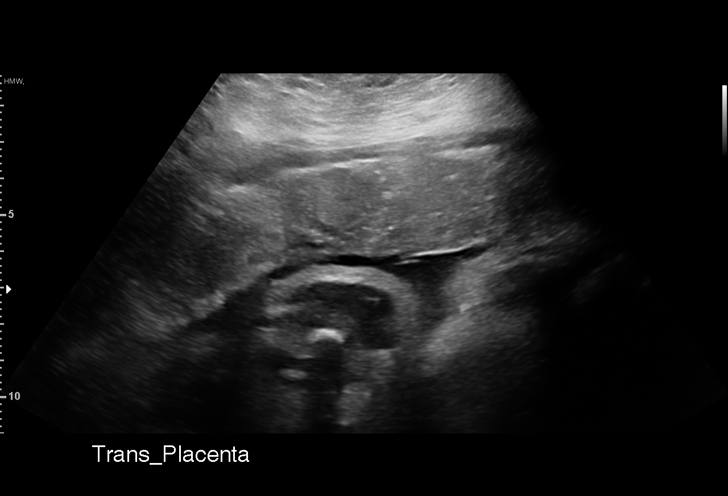
[im 19/27]
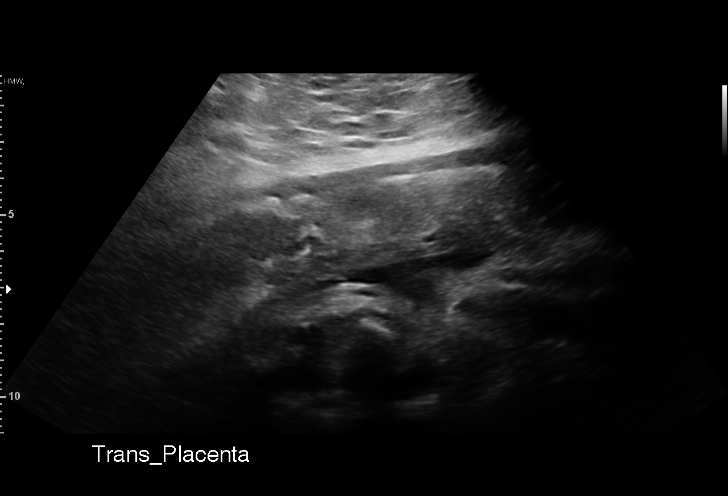
[im 21/27]
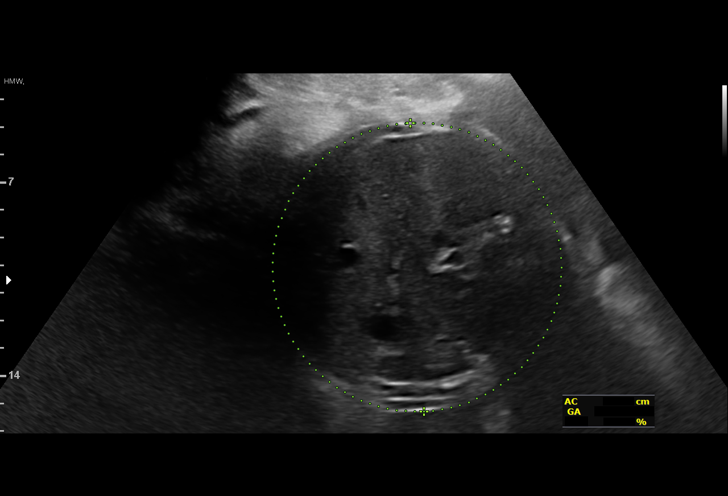
[im 23/27]
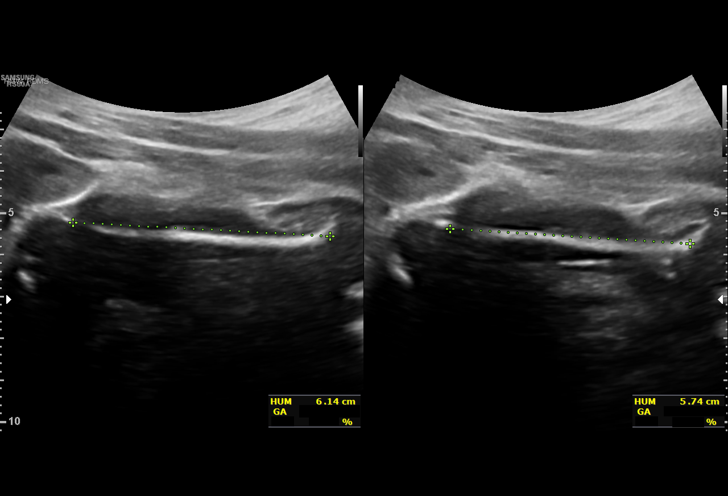
[im 25/27]
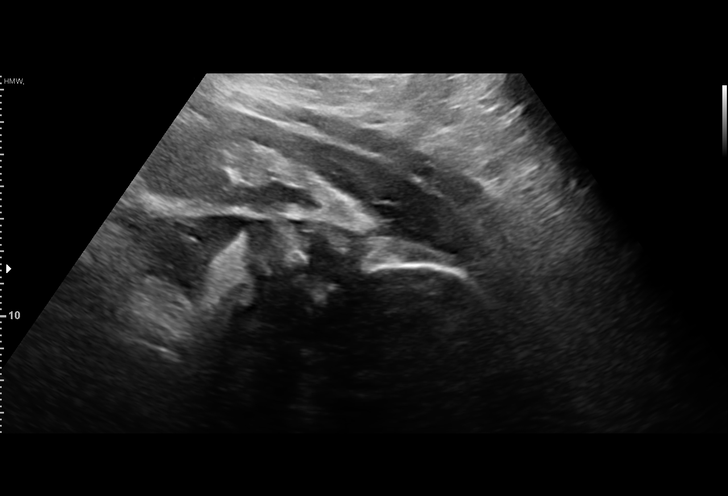
[im 27/27]
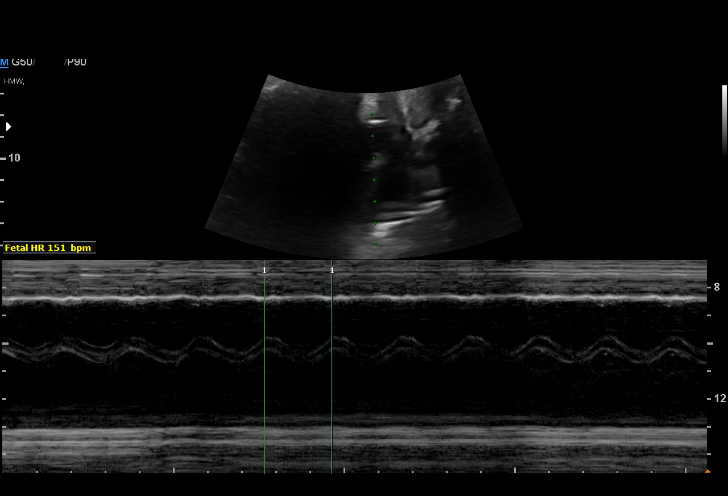

[14 of 27 positions shown; findings below may reference images not displayed]

[address]

                                                       [address]
 ----------------------------------------------------------------------

 ----------------------------------------------------------------------
Indications

  36 weeks gestation of pregnancy
  Obesity complicating pregnancy, second
  trimester(BMI 46)(AFP Negative
  Encounter for other antenatal screening
  follow-up
 ----------------------------------------------------------------------
Vital Signs

                                                Height:        5'7"
Fetal Evaluation

 Num Of Fetuses:         1
 Fetal Heart Rate(bpm):  151
 Cardiac Activity:       Observed
 Presentation:           Cephalic
 Placenta:               Anterior
 P. Cord Insertion:      Previously Visualized

 Amniotic Fluid
 AFI FV:      Within normal limits

 AFI Sum(cm)     %Tile       Largest Pocket(cm)
 10.68           28

 RUQ(cm)       RLQ(cm)       LUQ(cm)        LLQ(cm)

Biometry

 BPD:      96.8  mm     G. Age:  39w 4d       > 99  %    CI:        79.85   %    70 - 86
                                                         FL/HC:      19.7   %    20.1 -
 HC:      342.3  mm     G. Age:  39w 3d         90  %    HC/AC:      1.05        0.93 -
 AC:      324.8  mm     G. Age:  36w 3d         65  %    FL/BPD:     69.8   %    71 - 87
 FL:       67.6  mm     G. Age:  34w 5d         12  %    FL/AC:      20.8   %    20 - 24
 HUM:      59.4  mm     G. Age:  34w 3d         32  %

 Est. FW:    9994  gm    6 lb 10 oz      63  %
OB History

 Gravidity:    1
Gestational Age

 LMP:           36w 2d        Date:  08/02/18                 EDD:   05/09/19
 U/S Today:     37w 4d                                        EDD:   04/30/19
 Best:          36w 2d     Det. By:  LMP  (08/02/18)          EDD:   05/09/19
Anatomy

 Cranium:               Appears normal         Aortic Arch:            Previously seen
 Cavum:                 Previously seen        Ductal Arch:            Not well visualized
 Ventricles:            Previously seen        Diaphragm:              Previously seen
 Choroid Plexus:        Previously seen        Stomach:                Appears normal, left
                                                                       sided
 Cerebellum:            Previously seen        Abdomen:                Previously seen
 Posterior Fossa:       Previously seen        Abdominal Wall:         Previously seen
 Nuchal Fold:           Previously seen        Cord Vessels:           Previously seen
 Face:                  Orbits and profile     Kidneys:                Appear normal
                        previously seen
 Lips:                  Previously seen        Bladder:                Appears normal
 Thoracic:              Appears normal         Spine:                  Previously seen
 Heart:                 Previously seen        Upper Extremities:      Previously seen
 RVOT:                  Previously seen        Lower Extremities:      Previously seen
 LVOT:                  Previously seen

 Other:  Technically difficult due to maternal habitus and fetal position. Male
         gender.
Cervix Uterus Adnexa

 Cervix
 Not visualized (advanced GA >27wks)

 Uterus
 No abnormality visualized.

 Left Ovary
 No adnexal mass visualized.

 Right Ovary
 No adnexal mass visualized.

 Cul De Sac
 No free fluid seen.

 Adnexa
 No abnormality visualized.
Impression

 Normal interval growth.
Recommendations

 Follow up as clinically indicated.

## 2021-02-21 DIAGNOSIS — Z202 Contact with and (suspected) exposure to infections with a predominantly sexual mode of transmission: Secondary | ICD-10-CM | POA: Insufficient documentation

## 2021-03-02 ENCOUNTER — Encounter (HOSPITAL_BASED_OUTPATIENT_CLINIC_OR_DEPARTMENT_OTHER): Payer: Self-pay

## 2021-03-02 ENCOUNTER — Other Ambulatory Visit: Payer: Self-pay

## 2021-03-02 ENCOUNTER — Emergency Department (HOSPITAL_BASED_OUTPATIENT_CLINIC_OR_DEPARTMENT_OTHER)
Admission: EM | Admit: 2021-03-02 | Discharge: 2021-03-02 | Disposition: A | Discharge status: Home / Self Care | Payer: BC Managed Care – PPO | Attending: Emergency Medicine | Admitting: Emergency Medicine

## 2021-03-02 DIAGNOSIS — U071 COVID-19: Secondary | ICD-10-CM | POA: Diagnosis not present

## 2021-03-02 DIAGNOSIS — J069 Acute upper respiratory infection, unspecified: Secondary | ICD-10-CM | POA: Diagnosis not present

## 2021-03-02 DIAGNOSIS — R509 Fever, unspecified: Secondary | ICD-10-CM | POA: Diagnosis present

## 2021-03-02 LAB — SARS CORONAVIRUS 2 (TAT 6-24 HRS): SARS Coronavirus 2: POSITIVE — AB

## 2021-03-02 NOTE — ED Provider Notes (Signed)
[facility] EMERGENCY DEPARTMENT Provider Note   CSN: [id number] Arrival date & time: 03/02/21  1547     History Chief Complaint  Patient presents with  . Covid Exposure    Barbara Moore is a 30 y.o. female.  The history is provided by the patient.  URI Presenting symptoms: congestion and fever   Presenting symptoms: no cough, no ear pain and no sore throat   Severity:  Mild Onset quality:  Gradual Timing:  Constant Progression:  Unchanged Chronicity:  New Relieved by:  Nothing Worsened by:  Nothing Associated symptoms: no arthralgias, no headaches, no myalgias and no neck pain   Risk factors: sick contacts (mother with covid, close contact with her)        Past Medical History:  Diagnosis Date  . Herpes   . Lactose intolerance   . Obesity   . Seasonal allergies   . Sickle cell trait [facility])     Patient Active Problem List   Diagnosis Date Noted  . Postpartum anemia 05/03/2019  . Postpartum hemorrhage 05/03/2019  . Severe preeclampsia, delivered 05/01/2019  . Abnormal Pap smear of cervix 11/11/2018  . BMI 45.0-49.9, adult (HCC) 10/28/2018  . 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 NO PAST SURGERIES       OB History    Gravida  1   Para  1   Term  1   Preterm      AB      Living  1     SAB      IAB      Ectopic      Multiple  0   Live Births  1           Family History  Problem Relation Age of Onset  . Diabetes Mother   . Heart failure Mother   . Healthy Father   . Learning disabilities Sister     Social History   Tobacco Use  . Smoking status: Never Smoker  . Smokeless tobacco: Never Used  Vaping Use  . Vaping Use: Never used  Substance Use Topics  . Alcohol use: Not Currently  . Drug use: No    Home Medications Prior to Admission medications   Medication Sig Start Date End Date Taking? Authorizing Provider  enalapril (VASOTEC) 10 MG tablet Take 1 tablet (10  mg total) by mouth daily. 05/05/19   Hermina Staggers, MD  ibuprofen (ADVIL) 600 MG tablet Take 1 tablet (600 mg total) by mouth every 6 (six) hours. 05/04/19   Hermina Staggers, MD  norelgestromin-ethinyl estradiol (ORTHO EVRA) 150-35 MCG/24HR transdermal patch Place 1 patch onto the skin once a week. 1 patch weekly for 3 weeks, skip 4th week. Repeat every 4 weeks 07/26/19   Sparacino, Hailey L, DO  norethindrone (ORTHO MICRONOR) 0.35 MG tablet Take 1 tablet (0.35 mg total) by mouth daily. 05/21/19   Marny Lowenstein, PA-C    Allergies    Lactose intolerance (gi)  Review of Systems   Review of Systems  Constitutional: Positive for fever. Negative for chills.  HENT: Positive for congestion. Negative for ear pain and sore throat.   Eyes: Negative for pain and visual disturbance.  Respiratory: Negative for cough and shortness of breath.   Cardiovascular: Negative for chest pain and palpitations.  Gastrointestinal: Negative for abdominal pain and vomiting.  Genitourinary: Negative for dysuria and hematuria.  Musculoskeletal: Negative for arthralgias, back pain, myalgias and neck  pain.  Skin: Negative for color change and rash.  Neurological: Negative for seizures, syncope and headaches.  All other systems reviewed and are negative.   Physical Exam Updated Vital Signs BP 134/77 (BP Location: Right Arm)   Pulse (!) 107   Temp 99.5 F (37.5 C) (Oral)   Resp 16   Ht 5\' 4"  (1.626 m)   Wt (!) 140.6 kg   LMP 02/16/2021   SpO2 100%   BMI 53.21 kg/m   Physical Exam Vitals and nursing note reviewed.  Constitutional:      General: She is not in acute distress.    Appearance: She is well-developed.  HENT:     Head: Normocephalic and atraumatic.     Nose: Nose normal.     Mouth/Throat:     Mouth: Mucous membranes are moist.  Eyes:     Extraocular Movements: Extraocular movements intact.     Conjunctiva/sclera: Conjunctivae normal.     Pupils: Pupils are equal, round, and reactive to  light.  Cardiovascular:     Rate and Rhythm: Normal rate and regular rhythm.     Pulses: Normal pulses.     Heart sounds: Normal heart sounds. No murmur heard.   Pulmonary:     Effort: Pulmonary effort is normal. No respiratory distress.     Breath sounds: Normal breath sounds.  Abdominal:     Palpations: Abdomen is soft.     Tenderness: There is no abdominal tenderness.  Musculoskeletal:     Cervical back: Neck supple.  Skin:    General: Skin is warm and dry.     Capillary Refill: Capillary refill takes less than 2 seconds.  Neurological:     General: No focal deficit present.     Mental Status: She is alert.     ED Results / Procedures / Treatments   Labs (all labs ordered are listed, but only abnormal results are displayed) Labs Reviewed  SARS CORONAVIRUS 2 (TAT 6-24 HRS)    EKG None  Radiology No results found.  Procedures Procedures   Medications Ordered in ED Medications - No data to display  ED Course  I have reviewed the triage vital signs and the nursing notes.  Pertinent labs & imaging results that were available during my care of the patient were reviewed by me and considered in my medical decision making (see chart for details).    MDM Rules/Calculators/A&P                          Abiageal Blowe is here with URI symptoms.  Her mother tested positive for COVID today.  She is in very close contact with her.  Started to feel achy and congestion today.  Normal vitals.  Well-appearing.  No respiratory distress.  No GI symptoms.  Clear breath sounds.  Overall suspect COVID but mild.  She is vaccinated.  Understands supportive care at home and understands return precautions.  Discharged in good condition.  This chart was dictated using voice recognition software.  Despite best efforts to proofread,  errors can occur which can change the documentation meaning.  Jeyda Siebel was evaluated in Emergency Department on 03/02/2021 for the symptoms described in  the history of present illness. She was evaluated in the context of the global COVID-19 pandemic, which necessitated consideration that the patient might be at risk for infection with the SARS-CoV-2 virus that causes COVID-19. Institutional protocols and algorithms that pertain to the evaluation of patients at  risk for COVID-19 are in a state of rapid change based on information released by regulatory bodies including the CDC and federal and state organizations. These policies and algorithms were followed during the patient's care in the ED.  Final Clinical Impression(s) / ED Diagnoses Final diagnoses:  Upper respiratory tract infection, unspecified type    Rx / DC Orders ED Discharge Orders    None       Virgina Norfolk, DO 03/02/21 1612

## 2021-03-02 NOTE — ED Triage Notes (Signed)
Pt arrives with son who is also sick reports her mother has tested positive for Covid today. Pt has had cough stuffy nose.

## 2021-03-02 NOTE — ED Notes (Signed)
ED Provider at bedside, triage delayed

## 2021-03-03 ENCOUNTER — Telehealth: Payer: Self-pay | Admitting: Unknown Physician Specialty

## 2021-03-03 MED ORDER — MOLNUPIRAVIR EUA 200MG CAPSULE: 4.0000 | ORAL_CAPSULE | Freq: Two times a day (BID) | ORAL | 0 refills | Status: AC

## 2021-03-03 NOTE — Telephone Encounter (Signed)
Outpatient Oral COVID Treatment Note  I connected with Daisy Lazar on 03/03/2021/1:46 PM by telephone and verified that I am speaking with the correct person using two identifiers.  I discussed the limitations, risks, security, and privacy concerns of performing an evaluation and management service by telephone and the availability of in person appointments. I also discussed with the patient that there may be a patient responsible charge related to this service. The patient expressed understanding and agreed to proceed.  Patient location: home Provider location: home  Diagnosis: COVID-19 infection  Purpose of visit: Discussion of potential use of Molnupiravir or Paxlovid, a new treatment for mild to moderate COVID-19 viral infection in non-hospitalized patients.   Subjective: Patient is a 30 y.o. female who has been diagnosed with COVID 19 viral infection.  Their symptoms began on 5/27 with URI sxs.    Past Medical History:  Diagnosis Date  . Herpes   . Lactose intolerance   . Obesity   . Seasonal allergies   . Sickle cell trait (HCC)     Allergies  Allergen Reactions  . Lactose Intolerance (Gi) Diarrhea and Nausea And Vomiting     Current Outpatient Medications:  .  enalapril (VASOTEC) 10 MG tablet, Take 1 tablet (10 mg total) by mouth daily., Disp: 30 tablet, Rfl: 1 .  ibuprofen (ADVIL) 600 MG tablet, Take 1 tablet (600 mg total) by mouth every 6 (six) hours., Disp: 30 tablet, Rfl: 0 .  norelgestromin-ethinyl estradiol (ORTHO EVRA) 150-35 MCG/24HR transdermal patch, Place 1 patch onto the skin once a week. 1 patch weekly for 3 weeks, skip 4th week. Repeat every 4 weeks, Disp: 3 patch, Rfl: 12 .  norethindrone (ORTHO MICRONOR) 0.35 MG tablet, Take 1 tablet (0.35 mg total) by mouth daily., Disp: 1 Package, Rfl: 11  Objective: Patient appears/sounds well.  They are in no apparent distress.  Breathing is non labored.  Mood and behavior are normal.  Laboratory Data:  Recent Results  (from the past 2160 hour(s))  SARS CORONAVIRUS 2 (TAT 6-24 HRS) Nasopharyngeal Nasopharyngeal Swab     Status: Abnormal   Collection Time: 03/02/21  4:18 PM   Specimen: Nasopharyngeal Swab  Result Value Ref Range   SARS Coronavirus 2 POSITIVE (A) NEGATIVE    Comment: (NOTE) SARS-CoV-2 target nucleic acids are DETECTED.  The SARS-CoV-2 RNA is generally detectable in upper and lower respiratory specimens during the acute phase of infection. Positive results are indicative of the presence of SARS-CoV-2 RNA. Clinical correlation with patient history and other diagnostic information is  necessary to determine patient infection status. Positive results do not rule out bacterial infection or co-infection with other viruses.  The expected result is Negative.  Fact Sheet for Patients: HairSlick.no  Fact Sheet for Healthcare Providers: quierodirigir.com  This test is not yet approved or cleared by the [place] FDA and  has been authorized for detection and/or diagnosis of SARS-CoV-2 by FDA under an Emergency Use Authorization (EUA). This EUA will remain  in effect (meaning this test can be used) for the duration of the COVID-19 declaration under Section 564(b)(1) of the Act, 21 U. S.C. section 360bbb-3(b)(1), unless the authorization is terminated or revoked sooner.   Performed at [facility], [address], [place], [place] [address]      Assessment: 30 y.o. female with mild/moderate COVID 19 viral infection diagnosed on 5/26 at high risk for progression to severe COVID 19.  Plan:  This patient is a 30 y.o. female that meets the  following criteria for Emergency Use Authorization of: Molnupiravir  1. Age >18 yr 2. SARS-COV-2 positive test 3. Symptom onset < 5 days 4. Mild-to-moderate COVID disease with high risk for severe progression to hospitalization or death   I have spoken and communicated the following to the  patient or parent/caregiver regarding: 1. Molnupiravir is an unapproved drug that is authorized for use under an TEFL teacher.  2. There are no adequate, approved, available products for the treatment of COVID-19 in adults who have mild-to-moderate COVID-19 and are at high risk for progressing to severe COVID-19, including hospitalization or death. 3. Other therapeutics are currently authorized. For additional information on all products authorized for treatment or prevention of COVID-19, please see https://www.graham-miller.com/.  4. There are benefits and risks of taking this treatment as outlined in the "Fact Sheet for Patients and Caregivers."  5. "Fact Sheet for Patients and Caregivers" was reviewed with patient. A hard copy will be provided to patient from pharmacy prior to the patient receiving treatment. 6. Patients should continue to self-isolate and use infection control measures (e.g., wear mask, isolate, social distance, avoid sharing personal items, clean and disinfect "high touch" surfaces, and frequent handwashing) according to CDC guidelines.  7. The patient or parent/caregiver has the option to accept or refuse treatment. 8. [employer] has established a pregnancy surveillance program. 9. Females of childbearing potential should use a reliable method of contraception correctly and consistently, as applicable, for the duration of treatment and for 4 days after the last dose of Molnupiravir. 10. Males of reproductive potential who are sexually active with females of childbearing potential should use a reliable method of contraception correctly and consistently during treatment and for at least 3 months after the last dose. 11. Pregnancy status and risk was assessed. Patient verbalized understanding of precautions.   After reviewing above information with the patient, the  patient agrees to receive molnupiravir.  Follow up instructions:    . Take prescription BID x 5 days as directed . Reach out to pharmacist for counseling on medication if desired . For concerns regarding further COVID symptoms please follow up with your PCP or urgent care . For urgent or life-threatening issues, seek care at your local emergency department  The patient was provided an opportunity to ask questions, and all were answered. The patient agreed with the plan and demonstrated an understanding of the instructions.   Script sent to [facility]   The patient was advised to call their PCP or seek an in-person evaluation if the symptoms worsen or if the condition fails to improve as anticipated.   I provided 15 minutes of non face-to-face telephone visit time during this encounter, and > 50% was spent counseling as documented under my assessment & plan.  Gabriel Cirri, NP 03/03/2021 /1:46 PM

## 2021-05-24 ENCOUNTER — Encounter: Payer: Self-pay | Admitting: Nurse Practitioner

## 2021-07-06 ENCOUNTER — Encounter: Payer: BC Managed Care – PPO | Admitting: Obstetrics and Gynecology

## 2021-12-28 DIAGNOSIS — I10 Essential (primary) hypertension: Secondary | ICD-10-CM | POA: Insufficient documentation

## 2021-12-28 DIAGNOSIS — Z0000 Encounter for general adult medical examination without abnormal findings: Secondary | ICD-10-CM | POA: Insufficient documentation

## 2022-04-01 ENCOUNTER — Other Ambulatory Visit: Payer: Self-pay

## 2022-04-01 ENCOUNTER — Emergency Department (HOSPITAL_BASED_OUTPATIENT_CLINIC_OR_DEPARTMENT_OTHER): Payer: BC Managed Care – PPO

## 2022-04-01 ENCOUNTER — Emergency Department (HOSPITAL_BASED_OUTPATIENT_CLINIC_OR_DEPARTMENT_OTHER)
Admission: EM | Admit: 2022-04-01 | Discharge: 2022-04-02 | Disposition: A | Discharge status: Home / Self Care | Payer: BC Managed Care – PPO | Attending: Emergency Medicine | Admitting: Emergency Medicine

## 2022-04-01 DIAGNOSIS — N179 Acute kidney failure, unspecified: Secondary | ICD-10-CM | POA: Insufficient documentation

## 2022-04-01 DIAGNOSIS — F419 Anxiety disorder, unspecified: Secondary | ICD-10-CM | POA: Diagnosis not present

## 2022-04-01 DIAGNOSIS — R110 Nausea: Secondary | ICD-10-CM | POA: Diagnosis present

## 2022-04-01 DIAGNOSIS — M79602 Pain in left arm: Secondary | ICD-10-CM | POA: Diagnosis not present

## 2022-04-01 LAB — CBC
HCT: 36.5 % (ref 36.0–46.0)
Hemoglobin: 12.8 g/dL (ref 12.0–15.0)
MCH: 28.4 pg (ref 26.0–34.0)
MCHC: 35.1 g/dL (ref 30.0–36.0)
MCV: 81.1 fL (ref 80.0–100.0)
Platelets: 431 10*3/uL — ABNORMAL HIGH (ref 150–400)
RBC: 4.5 MIL/uL (ref 3.87–5.11)
RDW: 14.2 % (ref 11.5–15.5)
WBC: 9.8 10*3/uL (ref 4.0–10.5)
nRBC: 0 % (ref 0.0–0.2)

## 2022-04-01 LAB — BASIC METABOLIC PANEL
Anion gap: 7 (ref 5–15)
BUN: 15 mg/dL (ref 6–20)
CO2: 26 mmol/L (ref 22–32)
Calcium: 9.3 mg/dL (ref 8.9–10.3)
Chloride: 105 mmol/L (ref 98–111)
Creatinine, Ser: 1.12 mg/dL — ABNORMAL HIGH (ref 0.44–1.00)
GFR, Estimated: >60 mL/min (ref >60)
Glucose, Bld: 123 mg/dL — ABNORMAL HIGH (ref 70–99)
Potassium: 3.8 mmol/L (ref 3.5–5.1)
Sodium: 138 mmol/L (ref 135–145)

## 2022-04-01 LAB — PREGNANCY, URINE: Preg Test, Ur: NEGATIVE

## 2022-04-01 NOTE — ED Triage Notes (Signed)
Pt reports nausea and left arm "tingling" x 2 - 3 hours. Endorses cp and shob when sx started, but these sx have since resolved.

## 2022-04-02 LAB — HEPATIC FUNCTION PANEL
ALT: 18 U/L (ref 0–44)
AST: 22 U/L (ref 15–41)
Albumin: 4.1 g/dL (ref 3.5–5.0)
Alkaline Phosphatase: 108 U/L (ref 38–126)
Bilirubin, Direct: <0.1 mg/dL (ref 0.0–0.2)
Total Bilirubin: 0.3 mg/dL (ref 0.3–1.2)
Total Protein: 7.9 g/dL (ref 6.5–8.1)

## 2022-04-02 LAB — MAGNESIUM: Magnesium: 1.6 mg/dL — ABNORMAL LOW (ref 1.7–2.4)

## 2022-04-02 LAB — TSH: TSH: 0.485 u[IU]/mL (ref 0.350–4.500)

## 2022-04-02 LAB — TROPONIN I (HIGH SENSITIVITY): Troponin I (High Sensitivity): <2 ng/L (ref <18)

## 2022-04-02 MED ORDER — LACTATED RINGERS IV BOLUS
1000.0000 mL | Freq: Once | INTRAVENOUS | Status: AC
  Administered 2022-04-02: 1000 mL via INTRAVENOUS

## 2022-04-02 MED ORDER — ONDANSETRON HCL 4 MG/2ML IJ SOLN
4.0000 mg | Freq: Once | INTRAMUSCULAR | Status: AC
  Administered 2022-04-02: 4 mg via INTRAVENOUS
  Filled 2022-04-02: qty 2

## 2022-04-03 LAB — T3, FREE: T3, Free: 3.2 pg/mL (ref 2.0–4.4)

## 2022-04-21 ENCOUNTER — Emergency Department (HOSPITAL_BASED_OUTPATIENT_CLINIC_OR_DEPARTMENT_OTHER)
Admission: EM | Admit: 2022-04-21 | Discharge: 2022-04-21 | Disposition: A | Discharge status: Home / Self Care | Payer: BC Managed Care – PPO | Attending: Emergency Medicine | Admitting: Emergency Medicine

## 2022-04-21 ENCOUNTER — Encounter (HOSPITAL_BASED_OUTPATIENT_CLINIC_OR_DEPARTMENT_OTHER): Payer: Self-pay | Admitting: Emergency Medicine

## 2022-04-21 ENCOUNTER — Other Ambulatory Visit: Payer: Self-pay

## 2022-04-21 DIAGNOSIS — Z5189 Encounter for other specified aftercare: Secondary | ICD-10-CM

## 2022-04-21 DIAGNOSIS — Z4800 Encounter for change or removal of nonsurgical wound dressing: Secondary | ICD-10-CM | POA: Diagnosis present

## 2022-04-21 NOTE — ED Triage Notes (Addendum)
Pt states she had her thyroid removed last Friday and tonight noticed "the incision looks like its blistering and peeling". Pt denies other sx other than feeling lightheaded earlier when she was outside. She attributed that to the heat. Denies fevers or drainage. She states she feels like its moist or something is dripping but hasn't seen any drainage. No c/o difficulty swallowing or breathing.

## 2022-04-21 NOTE — Discharge Instructions (Signed)
Take a photo of the wound each day to try to progress. Call your doctor on Monday for follow-up If you have any worsening pain, swelling, bleeding in next 24-48 hours please return to the ER

## 2022-04-21 NOTE — ED Provider Notes (Signed)
[facility] EMERGENCY DEPARTMENT Provider Note   CSN: [id number] Arrival date & time: 04/21/22  0159     History  Chief Complaint  Patient presents with   Post-op Problem    Barbara Moore is a 31 y.o. female.  The history is provided by the patient.  Patient underwent total thyroidectomy on July 7.  There were no complications.  She reports over the past day she is noted what appeared to be some discharge from the wound.  She reports it looks like it is blistering.  No fevers or vomiting.  She has not seen any bleeding.  No difficulty breathing or swallowing.  No recent trauma She called the nurse helpline who told her to go to the ER    Home Medications Prior to Admission medications   Medication Sig Start Date End Date Taking? Authorizing Provider  enalapril (VASOTEC) 10 MG tablet Take 1 tablet (10 mg total) by mouth daily. 05/05/19   Hermina Staggers, MD  ibuprofen (ADVIL) 600 MG tablet Take 1 tablet (600 mg total) by mouth every 6 (six) hours. 05/04/19   Hermina Staggers, MD  norelgestromin-ethinyl estradiol (ORTHO EVRA) 150-35 MCG/24HR transdermal patch Place 1 patch onto the skin once a week. 1 patch weekly for 3 weeks, skip 4th week. Repeat every 4 weeks 07/26/19   Sparacino, Hailey L, DO  norethindrone (ORTHO MICRONOR) 0.35 MG tablet Take 1 tablet (0.35 mg total) by mouth daily. 05/21/19   Marny Lowenstein, PA-C      Allergies    Lactose intolerance (gi)    Review of Systems   Review of Systems  Physical Exam Updated Vital Signs BP 131/81 (BP Location: Right Arm)   Pulse 91   Temp 99.2 F (37.3 C) (Oral)   Resp 20   Ht 1.676 m (5\' 6" )   Wt (!) 145.2 kg   LMP 03/25/2022 (Approximate)   SpO2 100% Comment: Simultaneous filing. User may not have seen previous data.  BMI 51.65 kg/m  Physical Exam CONSTITUTIONAL: Well developed/well nourished HEAD: Normocephalic/atraumatic EYES: EOMI/PERRL ENMT: Mucous membranes moist, no stridor/drooling, no dysphonia.    NECK: supple no meningeal signs see photo No crepitus.  No discharge/bleeding.  Mild tenderness noted NEURO: Pt is awake/alert/appropriate, moves all extremitiesx4.  No facial droop.   SKIN: warm, color normal PSYCH: no abnormalities of mood noted, alert and oriented to situation    ED Results / Procedures / Treatments   Labs (all labs ordered are listed, but only abnormal results are displayed) Labs Reviewed - No data to display  EKG None  Radiology No results found.  Procedures Procedures    Medications Ordered in ED Medications - No data to display  ED Course/ Medical Decision Making/ A&P                           Medical Decision Making  Patient had a photo from approximately 6 days ago of the wound.  It looks very similar when compared to tonight's photo.  On my exam there is no signs of any erythema or bleeding.  It does not appear infected.  Patient is overall well-appearing in no acute distress.  We will discharge home.  Advised to follow-up with her ENT physician.  She also reported feeling mildly lightheaded when she was outside in the heat, but now feeling improved no other complaint        Final Clinical Impression(s) / ED Diagnoses Final diagnoses:  Visit for wound check    Rx / DC Orders ED Discharge Orders     None         Zadie Rhine, MD 04/21/22 [phone]

## 2022-05-13 DIAGNOSIS — E890 Postprocedural hypothyroidism: Secondary | ICD-10-CM | POA: Insufficient documentation

## 2022-05-13 DIAGNOSIS — C73 Malignant neoplasm of thyroid gland: Secondary | ICD-10-CM | POA: Insufficient documentation

## 2022-06-24 ENCOUNTER — Other Ambulatory Visit: Payer: Self-pay

## 2022-06-24 ENCOUNTER — Encounter (HOSPITAL_BASED_OUTPATIENT_CLINIC_OR_DEPARTMENT_OTHER): Payer: Self-pay | Admitting: Urology

## 2022-06-24 ENCOUNTER — Emergency Department (HOSPITAL_BASED_OUTPATIENT_CLINIC_OR_DEPARTMENT_OTHER)
Admission: EM | Admit: 2022-06-24 | Discharge: 2022-06-24 | Disposition: A | Discharge status: Home / Self Care | Payer: BC Managed Care – PPO | Attending: Emergency Medicine | Admitting: Emergency Medicine

## 2022-06-24 DIAGNOSIS — Z85850 Personal history of malignant neoplasm of thyroid: Secondary | ICD-10-CM | POA: Insufficient documentation

## 2022-06-24 DIAGNOSIS — F419 Anxiety disorder, unspecified: Secondary | ICD-10-CM

## 2022-06-24 DIAGNOSIS — E890 Postprocedural hypothyroidism: Secondary | ICD-10-CM | POA: Diagnosis not present

## 2022-06-24 NOTE — ED Provider Notes (Signed)
[facility]  Provider Note  CSN: [id number] Arrival date & time: 06/24/22 9379  History Chief Complaint  Patient presents with   Anxiety    Barbara Moore is a 31 y.o. female with a history of anxiety on Effexor and acquired hypothyroidism (thyroidectomy of Hurtlhe Cell Carcinoma in July 2023) on synthroid had to leave town over the weekend and forgot to take her synthroid with her. She missed her usual dose Saturday and Sunday mornings. She felt tired and sluggish over the weekend but when she returned home, she took a dose Sunday evening (last night) around 1700 hrs. She was feeling better afterwards but had some trouble falling asleep. She woke up around 3am with her heart racing and feeling nervous and restless with nausea. She called poison control who advised her she might need to have her thyroid levels checked. She reports she has been referred to Endocrine but does not have an appointment until December. Her ENT surgeon is currently managing her synthroid.    Home Medications Prior to Admission medications   Medication Sig Start Date End Date Taking? Authorizing Provider  enalapril (VASOTEC) 10 MG tablet Take 1 tablet (10 mg total) by mouth daily. 05/05/19   Chancy Milroy, MD  ibuprofen (ADVIL) 600 MG tablet Take 1 tablet (600 mg total) by mouth every 6 (six) hours. 05/04/19   Chancy Milroy, MD  norelgestromin-ethinyl estradiol (ORTHO EVRA) 150-35 MCG/24HR transdermal patch Place 1 patch onto the skin once a week. 1 patch weekly for 3 weeks, skip 4th week. Repeat every 4 weeks 07/26/19   Sparacino, Hailey L, DO  norethindrone (ORTHO MICRONOR) 0.35 MG tablet Take 1 tablet (0.35 mg total) by mouth daily. 05/21/19   Luvenia Redden, PA-C     Allergies    Lactose intolerance (gi)   Review of Systems   Review of Systems Please see HPI for pertinent positives and negatives  Physical Exam BP 129/82 (BP Location: Right Arm)   Pulse 79   Temp 98.2  F (36.8 C) (Oral)   Resp (!) 22   Ht 5\' 6"  (1.676 m)   Wt (!) 145.2 kg   LMP 06/24/2022 (Exact Date)   SpO2 97%   BMI 51.67 kg/m   Physical Exam Vitals and nursing note reviewed.  HENT:     Head: Normocephalic.     Nose: Nose normal.  Eyes:     Extraocular Movements: Extraocular movements intact.  Pulmonary:     Effort: Pulmonary effort is normal.  Musculoskeletal:        General: Normal range of motion.     Cervical back: Neck supple.  Skin:    Findings: No rash (on exposed skin).  Neurological:     Mental Status: She is alert and oriented to person, place, and time.  Psychiatric:        Mood and Affect: Mood normal.     ED Results / Procedures / Treatments   EKG None  Procedures Procedures  Medications Ordered in the ED Medications - No data to display  Initial Impression and Plan  Patient well appearing in no distressed concerned about her thyroid function. She initially was requesting her TFTs be checked here. I offered to have them drawn to help facilitate follow up, but I informed her that the results would not be available during her ED visit. She has decided to just call her ENT doctor when their office opens later today. She does not have any evidence of  thyroid storm or myxedema coma to suggest an emergent thyroid issue. She was advised to take her usual Monday dose in the middle of the day and then return to her usual morning dose tomorrow. She is comfortable with that plan.   ED Course       MDM Rules/Calculators/A&P Medical Decision Making Problems Addressed: Anxiety: acute illness or injury Postoperative hypothyroidism: chronic illness or injury    Final Clinical Impression(s) / ED Diagnoses Final diagnoses:  Anxiety  Postoperative hypothyroidism    Rx / DC Orders ED Discharge Orders     None        Pollyann Savoy, MD 06/24/22 [phone]

## 2022-06-24 NOTE — ED Triage Notes (Signed)
States missed 2 days of her synthroid while out of town, started taking it again today at 26 and hasnt been able to sleep  States anxiety and restless, reports nausea States chills since Saturday   States wants thyroid levels checked after calling poison control

## 2023-04-20 ENCOUNTER — Emergency Department (HOSPITAL_BASED_OUTPATIENT_CLINIC_OR_DEPARTMENT_OTHER): Payer: BC Managed Care – PPO

## 2023-04-20 ENCOUNTER — Emergency Department (HOSPITAL_COMMUNITY)
Admission: EM | Admit: 2023-04-20 | Discharge: 2023-04-20 | Disposition: A | Discharge status: Home / Self Care | Payer: BC Managed Care – PPO | Attending: Emergency Medicine | Admitting: Emergency Medicine

## 2023-04-20 ENCOUNTER — Other Ambulatory Visit: Payer: Self-pay

## 2023-04-20 ENCOUNTER — Emergency Department (HOSPITAL_COMMUNITY): Payer: BC Managed Care – PPO

## 2023-04-20 DIAGNOSIS — Z85850 Personal history of malignant neoplasm of thyroid: Secondary | ICD-10-CM | POA: Insufficient documentation

## 2023-04-20 DIAGNOSIS — E876 Hypokalemia: Secondary | ICD-10-CM | POA: Insufficient documentation

## 2023-04-20 DIAGNOSIS — I509 Heart failure, unspecified: Secondary | ICD-10-CM | POA: Insufficient documentation

## 2023-04-20 DIAGNOSIS — M7989 Other specified soft tissue disorders: Secondary | ICD-10-CM

## 2023-04-20 DIAGNOSIS — D649 Anemia, unspecified: Secondary | ICD-10-CM | POA: Insufficient documentation

## 2023-04-20 DIAGNOSIS — R600 Localized edema: Secondary | ICD-10-CM | POA: Diagnosis not present

## 2023-04-20 LAB — CBC WITH DIFFERENTIAL/PLATELET
Abs Immature Granulocytes: 0.03 10*3/uL (ref 0.00–0.07)
Basophils Absolute: 0 10*3/uL (ref 0.0–0.1)
Basophils Relative: 0 %
Eosinophils Absolute: 0.2 10*3/uL (ref 0.0–0.5)
Eosinophils Relative: 2 %
HCT: 33.4 % — ABNORMAL LOW (ref 36.0–46.0)
Hemoglobin: 11.4 g/dL — ABNORMAL LOW (ref 12.0–15.0)
Immature Granulocytes: 0 %
Lymphocytes Relative: 26 %
Lymphs Abs: 1.9 10*3/uL (ref 0.7–4.0)
MCH: 27.4 pg (ref 26.0–34.0)
MCHC: 34.1 g/dL (ref 30.0–36.0)
MCV: 80.3 fL (ref 80.0–100.0)
Monocytes Absolute: 0.2 10*3/uL (ref 0.1–1.0)
Monocytes Relative: 2 %
Neutro Abs: 5.2 10*3/uL (ref 1.7–7.7)
Neutrophils Relative %: 70 %
Platelets: 348 10*3/uL (ref 150–400)
RBC: 4.16 MIL/uL (ref 3.87–5.11)
RDW: 14.6 % (ref 11.5–15.5)
WBC: 7.5 10*3/uL (ref 4.0–10.5)
nRBC: 0 % (ref 0.0–0.2)

## 2023-04-20 LAB — BASIC METABOLIC PANEL
Anion gap: 5 (ref 5–15)
BUN: 10 mg/dL (ref 6–20)
CO2: 25 mmol/L (ref 22–32)
Calcium: 8.5 mg/dL — ABNORMAL LOW (ref 8.9–10.3)
Chloride: 108 mmol/L (ref 98–111)
Creatinine, Ser: 0.65 mg/dL (ref 0.44–1.00)
GFR, Estimated: >60 mL/min (ref >60)
Glucose, Bld: 127 mg/dL — ABNORMAL HIGH (ref 70–99)
Potassium: 3.3 mmol/L — ABNORMAL LOW (ref 3.5–5.1)
Sodium: 138 mmol/L (ref 135–145)

## 2023-04-20 LAB — BRAIN NATRIURETIC PEPTIDE: B Natriuretic Peptide: 51 pg/mL (ref 0.0–100.0)

## 2023-04-20 LAB — HCG, SERUM, QUALITATIVE: Preg, Serum: NEGATIVE

## 2023-04-20 MED ORDER — POTASSIUM CHLORIDE 20 MEQ PO PACK
20.0000 meq | PACK | Freq: Once | ORAL | Status: AC
  Administered 2023-04-20: 20 meq via ORAL
  Filled 2023-04-20: qty 1

## 2023-04-20 NOTE — ED Provider Notes (Signed)
[place] EMERGENCY DEPARTMENT AT [facility] Provider Note   CSN: [id number] Arrival date & time: 04/20/23  1409     History  Chief Complaint  Patient presents with   BLE swelling   lt foot pain    Barbara Moore is a 32 y.o. female.  HPI 32 year old female history of sickle cell trait, thyroid cancer status post thyroidectomy presenting for leg swelling.  She states for last few months has had intermittent bilateral lower extremity edema.  She woke up today and felt like it was worse bilaterally and now has pain to her left ankle.  She did not have any fall or injury.  No redness or wounds.  She denies any chest pain or difficulty breathing.  No history of PE or DVT.  No history of cardiac issues or heart failure.  She otherwise has been doing well.  No fevers or chills.  No recent travel.     Home Medications Prior to Admission medications   Medication Sig Start Date End Date Taking? Authorizing Provider  enalapril (VASOTEC) 10 MG tablet Take 1 tablet (10 mg total) by mouth daily. 05/05/19   Hermina Staggers, MD  ibuprofen (ADVIL) 600 MG tablet Take 1 tablet (600 mg total) by mouth every 6 (six) hours. 05/04/19   Hermina Staggers, MD  norelgestromin-ethinyl estradiol (ORTHO EVRA) 150-35 MCG/24HR transdermal patch Place 1 patch onto the skin once a week. 1 patch weekly for 3 weeks, skip 4th week. Repeat every 4 weeks 07/26/19   Sparacino, Hailey L, DO  norethindrone (ORTHO MICRONOR) 0.35 MG tablet Take 1 tablet (0.35 mg total) by mouth daily. 05/21/19   Marny Lowenstein, PA-C      Allergies    Lactose intolerance (gi)    Review of Systems   Review of Systems Review of systems completed and notable as per HPI.  ROS otherwise negative.   Physical Exam Updated Vital Signs BP (!) 145/85 (BP Location: Left Arm)   Pulse 92   Temp 99.3 F (37.4 C) (Oral)   Resp 18   SpO2 99%  Physical Exam Vitals and nursing note reviewed.  Constitutional:      General: She is not  in acute distress.    Appearance: She is well-developed.  HENT:     Head: Normocephalic and atraumatic.  Eyes:     Conjunctiva/sclera: Conjunctivae normal.  Cardiovascular:     Rate and Rhythm: Normal rate and regular rhythm.     Pulses: Normal pulses.     Heart sounds: Normal heart sounds. No murmur heard. Pulmonary:     Effort: Pulmonary effort is normal. No respiratory distress.     Breath sounds: Normal breath sounds.  Abdominal:     Palpations: Abdomen is soft.     Tenderness: There is no abdominal tenderness.  Musculoskeletal:        General: No swelling.     Cervical back: Neck supple.     Right lower leg: Edema present.     Left lower leg: Edema present.     Comments: Mild bilateral pitting edema.  Tenderness around the ankle.  No redness, crepitus.  Palpable DP and PT pulses.  Good strength normal sensation.  Skin:    General: Skin is warm and dry.     Capillary Refill: Capillary refill takes less than 2 seconds.  Neurological:     Mental Status: She is alert.  Psychiatric:        Mood and Affect: Mood normal.  ED Results / Procedures / Treatments   Labs (all labs ordered are listed, but only abnormal results are displayed) Labs Reviewed  CBC WITH DIFFERENTIAL/PLATELET - Abnormal; Notable for the following components:      Result Value   Hemoglobin 11.4 (*)    HCT 33.4 (*)    All other components within normal limits  BASIC METABOLIC PANEL - Abnormal; Notable for the following components:   Potassium 3.3 (*)    Glucose, Bld 127 (*)    Calcium 8.5 (*)    All other components within normal limits  BRAIN NATRIURETIC PEPTIDE  HCG, SERUM, QUALITATIVE    EKG None  Radiology VAS [place] LOWER EXTREMITY VENOUS (DVT) (ONLY [facility] & [facility])  Result Date: 04/20/2023  Lower Venous DVT Study Patient Name:  Barbara Moore  Date of Exam:   04/20/2023 Medical Rec #: [id number]      Accession #:    [id number] Date of Birth: 20-Mar-1991     Patient Gender: F Patient Age:   31 years  Exam Location:  [facility] Procedure:      VAS [place] LOWER EXTREMITY VENOUS (DVT) Referring Phys: Marja Kays Ernestyne Caldwell --------------------------------------------------------------------------------  Indications: Bilateral swelling with new, worsening left pain.  Comparison Study: No prior studies. Performing Technologist: Jean Rosenthal RDMS, RVT  Examination Guidelines: A complete evaluation includes B-mode imaging, spectral Doppler, color Doppler, and power Doppler as needed of all accessible portions of each vessel. Bilateral testing is considered an integral part of a complete examination. Limited examinations for reoccurring indications may be performed as noted. The reflux portion of the exam is performed with the patient in reverse Trendelenburg.  +---------+---------------+---------+-----------+----------+--------------+ RIGHT    CompressibilityPhasicitySpontaneityPropertiesThrombus Aging +---------+---------------+---------+-----------+----------+--------------+ CFV      Full           Yes      Yes                                 +---------+---------------+---------+-----------+----------+--------------+ SFJ      Full                                                        +---------+---------------+---------+-----------+----------+--------------+ FV Prox  Full                                                        +---------+---------------+---------+-----------+----------+--------------+ FV Mid   Full                                                        +---------+---------------+---------+-----------+----------+--------------+ FV DistalFull                                                        +---------+---------------+---------+-----------+----------+--------------+ PFV      Full                                                        +---------+---------------+---------+-----------+----------+--------------+  POP      Full           Yes      Yes                                  +---------+---------------+---------+-----------+----------+--------------+ PTV      Full                                                        +---------+---------------+---------+-----------+----------+--------------+ PERO     Full                                                        +---------+---------------+---------+-----------+----------+--------------+   +---------+---------------+---------+-----------+----------+--------------+ LEFT     CompressibilityPhasicitySpontaneityPropertiesThrombus Aging +---------+---------------+---------+-----------+----------+--------------+ CFV      Full           Yes      Yes                                 +---------+---------------+---------+-----------+----------+--------------+ SFJ      Full                                                        +---------+---------------+---------+-----------+----------+--------------+ FV Prox  Full                                                        +---------+---------------+---------+-----------+----------+--------------+ FV Mid   Full                                                        +---------+---------------+---------+-----------+----------+--------------+ FV DistalFull                                                        +---------+---------------+---------+-----------+----------+--------------+ PFV      Full                                                        +---------+---------------+---------+-----------+----------+--------------+ POP      Full           Yes      Yes                                 +---------+---------------+---------+-----------+----------+--------------+  PTV      Full                                                        +---------+---------------+---------+-----------+----------+--------------+ PERO     Full                                                         +---------+---------------+---------+-----------+----------+--------------+     Summary: RIGHT: - There is no evidence of deep vein thrombosis in the lower extremity.  - No cystic structure found in the popliteal fossa.  LEFT: - There is no evidence of deep vein thrombosis in the lower extremity.  - No cystic structure found in the popliteal fossa.  *See table(s) above for measurements and observations.    Preliminary    DG Ankle 2 Views Left  Result Date: 04/20/2023 CLINICAL DATA:  Left ankle pain and swelling for 2 weeks. No known injury. EXAM: LEFT ANKLE - 2 VIEW COMPARISON:  None Available. FINDINGS: There is no evidence of fracture, dislocation, or joint effusion. There is no evidence of arthropathy or other focal bone abnormality. Diffuse soft tissue edema is seen involving the lower leg and foot. IMPRESSION: Diffuse soft tissue edema. No osseous abnormality. Electronically Signed   By: Danae Orleans M.D.   On: 04/20/2023 16:12    Procedures Procedures    Medications Ordered in ED Medications  potassium chloride (KLOR-CON) packet 20 mEq (has no administration in time range)    ED Course/ Medical Decision Making/ A&P                             Medical Decision Making Amount and/or Complexity of Data Reviewed Labs: ordered. Radiology: ordered.  Risk Prescription drug management.   Medical Decision Making:   Tammra Dobish is a 32 y.o. female who presented to the ED today with bilateral lower extremity edema and left ankle pain.  Vital signs reviewed.  On exam she is well-appearing, she does have mild lower extremity edema.  Differential including venous stasis, DVT, heart failure.  No other symptoms of CHF.  She is no signs of skin or soft tissue infection.  She has atraumatic ankle pain, however does have pain with bearing weight so obtain x-ray of ankle as well as DVT study SPECT with history of cancer.  Also obtain lab workup.  Reviewed and confirmed nursing documentation for  past medical history, family history, social history.  Initial Study Results:   Laboratory  All laboratory results reviewed.  Labs notable for stable anemia, mild hypokalemia.  Radiology:  All images reviewed independently.  No ankle fracture, no DVT.  Agree with radiology report at this time.    Reassessment and Plan:   Patient remained stable on reassessment.  Work appears reassuring.  Suspect she could have some venous stasis, recommended compression stockings and following up closely with her PCP.  I gave her strict return precautions for new or worsening symptoms.  She was comfortable this plan.  Discharged in stable condition.   Patient's presentation is most consistent with acute complicated illness / injury requiring diagnostic workup.  Final Clinical Impression(s) / ED Diagnoses Final diagnoses:  Leg swelling    Rx / DC Orders ED Discharge Orders     None         Laurence Spates, MD 04/20/23 1728

## 2023-04-20 NOTE — Discharge Instructions (Signed)
You were seen today for for leg swelling.  Your ultrasound and lab work were reassuring.  You should call your doctor this week to schedule follow-up.  If develop severe pain, redness, fever you should return to the ED.

## 2023-04-20 NOTE — ED Triage Notes (Signed)
Pt c/o x2 weeks of bilateral leg swelling from her knees to her feet with pain complaint in her left foot. Denies injury, or hx of chf.

## 2023-04-20 NOTE — Progress Notes (Signed)
Lower extremity venous bilateral study completed.  Preliminary results relayed to Earlene Plater, MD.   See CV Proc for preliminary results report.   Jean Rosenthal, RDMS, RVT

## 2023-07-02 ENCOUNTER — Emergency Department (HOSPITAL_BASED_OUTPATIENT_CLINIC_OR_DEPARTMENT_OTHER): Payer: BC Managed Care – PPO | Admitting: Radiology

## 2023-07-02 ENCOUNTER — Encounter (HOSPITAL_BASED_OUTPATIENT_CLINIC_OR_DEPARTMENT_OTHER): Payer: Self-pay | Admitting: Emergency Medicine

## 2023-07-02 ENCOUNTER — Other Ambulatory Visit: Payer: Self-pay

## 2023-07-02 ENCOUNTER — Emergency Department (HOSPITAL_BASED_OUTPATIENT_CLINIC_OR_DEPARTMENT_OTHER)
Admission: EM | Admit: 2023-07-02 | Discharge: 2023-07-02 | Disposition: A | Discharge status: Home / Self Care | Payer: BC Managed Care – PPO | Attending: Emergency Medicine | Admitting: Emergency Medicine

## 2023-07-02 DIAGNOSIS — J40 Bronchitis, not specified as acute or chronic: Secondary | ICD-10-CM | POA: Diagnosis not present

## 2023-07-02 DIAGNOSIS — Z79899 Other long term (current) drug therapy: Secondary | ICD-10-CM | POA: Diagnosis not present

## 2023-07-02 DIAGNOSIS — C801 Malignant (primary) neoplasm, unspecified: Secondary | ICD-10-CM

## 2023-07-02 DIAGNOSIS — Z1152 Encounter for screening for COVID-19: Secondary | ICD-10-CM | POA: Insufficient documentation

## 2023-07-02 DIAGNOSIS — R059 Cough, unspecified: Secondary | ICD-10-CM | POA: Diagnosis present

## 2023-07-02 HISTORY — DX: Malignant (primary) neoplasm, unspecified: C80.1

## 2023-07-02 LAB — RESP PANEL BY RT-PCR (RSV, FLU A&B, COVID)  RVPGX2
Influenza A by PCR: NEGATIVE
Influenza B by PCR: NEGATIVE
Resp Syncytial Virus by PCR: NEGATIVE
SARS Coronavirus 2 by RT PCR: NEGATIVE

## 2023-07-02 MED ORDER — PREDNISONE 20 MG PO TABS: 60.0000 mg | ORAL_TABLET | Freq: Every day | ORAL | 0 refills | Status: AC

## 2023-07-02 NOTE — ED Notes (Signed)
Pt discharged to home using teachback Method. Discharge instructions have been discussed with patient and/or family members. Pt verbally acknowledges understanding d/c instructions, has been given opportunity for questions to be answered, and endorses comprehension to checkout at registration before leaving.  

## 2023-07-02 NOTE — Discharge Instructions (Addendum)
Your exam today was reassuring.  X-ray without evidence of pneumonia.  COVID, flu, RSV negative.  I have sent prednisone into the pharmacy for you.  Continue taking your cough medicine.  For any concerning symptoms return to the emergency room or follow-up with your PCP.

## 2023-07-02 NOTE — ED Triage Notes (Signed)
Cough, uri symptoms x 1 month, getting worse Seen at UC 1 week ago given promethazine and albuterol symptoms worse, given amox no relief

## 2023-07-02 NOTE — ED Provider Notes (Signed)
[place] EMERGENCY DEPARTMENT AT [facility] Provider Note   CSN: [id number] Arrival date & time: 07/02/23  1406     History  Chief Complaint  Patient presents with   Cough    Barbara Moore is a 32 y.o. female.  32 year old female presents today for couple week duration of URI symptoms.  She states she has tried albuterol inhalers, amoxicillin without significant relief.  Endorses postnasal drip as well.  No fever.  Denies chest pain.  The history is provided by the patient. No language interpreter was used.       Home Medications Prior to Admission medications   Medication Sig Start Date End Date Taking? Authorizing Provider  predniSONE (DELTASONE) 20 MG tablet Take 3 tablets (60 mg total) by mouth daily with breakfast for 5 days. 07/02/23 07/07/23 Yes Hevin Jeffcoat, PA-C  enalapril (VASOTEC) 10 MG tablet Take 1 tablet (10 mg total) by mouth daily. 05/05/19   Hermina Staggers, MD  ibuprofen (ADVIL) 600 MG tablet Take 1 tablet (600 mg total) by mouth every 6 (six) hours. 05/04/19   Hermina Staggers, MD  norelgestromin-ethinyl estradiol (ORTHO EVRA) 150-35 MCG/24HR transdermal patch Place 1 patch onto the skin once a week. 1 patch weekly for 3 weeks, skip 4th week. Repeat every 4 weeks 07/26/19   Sparacino, Hailey L, DO  norethindrone (ORTHO MICRONOR) 0.35 MG tablet Take 1 tablet (0.35 mg total) by mouth daily. 05/21/19   Marny Lowenstein, PA-C      Allergies    Lactose intolerance (gi)    Review of Systems   Review of Systems  Constitutional:  Negative for fever.  HENT:  Positive for congestion and postnasal drip. Negative for sore throat.   Respiratory:  Positive for cough. Negative for shortness of breath.   Cardiovascular:  Negative for chest pain.  All other systems reviewed and are negative.   Physical Exam Updated Vital Signs BP (!) 129/95 (BP Location: Right Arm)   Pulse 88   Temp 98.3 F (36.8 C)   Resp 16   LMP 06/01/2023   SpO2 95%  Physical  Exam Vitals and nursing note reviewed.  Constitutional:      General: She is not in acute distress.    Appearance: Normal appearance. She is not ill-appearing.  HENT:     Head: Normocephalic and atraumatic.     Nose: Nose normal.  Eyes:     Conjunctiva/sclera: Conjunctivae normal.  Cardiovascular:     Rate and Rhythm: Normal rate and regular rhythm.  Pulmonary:     Effort: Pulmonary effort is normal. No respiratory distress.     Breath sounds: Normal breath sounds. No wheezing.  Musculoskeletal:        General: No deformity.  Skin:    Findings: No rash.  Neurological:     Mental Status: She is alert.     ED Results / Procedures / Treatments   Labs (all labs ordered are listed, but only abnormal results are displayed) Labs Reviewed  RESP PANEL BY RT-PCR (RSV, FLU A&B, COVID)  [id number]    EKG None  Radiology DG Chest 2 View  Result Date: 07/02/2023 CLINICAL DATA:  Shortness of breath EXAM: CHEST - 2 VIEW COMPARISON:  04/01/2022 FINDINGS: The heart size and mediastinal contours are within normal limits. Both lungs are clear. The visualized skeletal structures are unremarkable. IMPRESSION: No active cardiopulmonary disease. Electronically Signed   By: Judie Petit.  Shick M.D.   On: 07/02/2023 14:57    Procedures Procedures  Medications Ordered in ED Medications - No data to display  ED Course/ Medical Decision Making/ A&P                                 Medical Decision Making Amount and/or Complexity of Data Reviewed Radiology: ordered.  Risk Prescription drug management.   32 year old female presents today for concern of URI symptoms.  Hemodynamically stable.  Well-appearing and in no acute distress.  Lung sounds are clear.  Chest x-ray without evidence of pneumonia.  Respiratory panel negative.  Likely bronchitis or other reactive airway disease.  Will provide prednisone.  Discussed sinus rinse.  She has tried amoxicillin.  Do not feel she needs another course of  antibiotics.  She is stable for discharge.  Discharged in stable condition.  Final Clinical Impression(s) / ED Diagnoses Final diagnoses:  Bronchitis    Rx / DC Orders ED Discharge Orders          Ordered    predniSONE (DELTASONE) 20 MG tablet  Daily with breakfast        07/02/23 1707              Marita Kansas, PA-C 07/02/23 1711    Terrilee Files, MD 07/03/23 1030

## 2023-09-17 ENCOUNTER — Other Ambulatory Visit: Payer: Self-pay | Admitting: Medical Genetics

## 2023-10-08 NOTE — L&D Delivery Note (Signed)
 Delivery Note Barbara Moore is a 33 y.o. G2P1102 at [redacted]w[redacted]d admitted for IOL for elevated LFTs.   GBS Status:     Labor course: Initial SVE: 3/thick/-3. Augmentation with: AROM and Pitocin . She then progressed to complete.  ROM: 7h 44m with clear fluid  Birth: After a very brief 2nd stage, she delivered a Live born female  Birth Weight:   APGAR: 7,   Newborn Delivery   Birth date/time: 09/17/2023 04:32:00 Delivery type: Vaginal, Spontaneous        Delivered via spontaneous vaginal delivery (Presentation: LOA ). Nuchal cord present: No. . Shoulders and body delivered in usual fashion. Infant placed directly on mom's abdomen for bonding/skin-to-skin, baby dried and stimulated. Cord clamped x 2 after 1 minute and cut by CNM.  Cord blood collected. Placenta delivered-Spontaneous with 3 vessels. 20u Pitocin  in 500cc LR given as a bolus prior to delivery of placenta. Additionally, TXA 1gm was given IV.  She had an immediate large gush of blood which slowed after vigorous massage/LUS cleared of clots. Fundus firm with massage. Placenta inspected and appears to be intact with a 3 VC.  Sponge and instrument count were correct x2.  Intrapartum complications:  None Anesthesia:  epidural Lacerations:  none Suture Repair: N/A EBL (mL):900.00   Newborn Data: Birth date:09/17/2023 Birth time:4:32 AM Gender:Female Living status:Living Apgars:6 ,8  Weight:3.41 kg    Mom to postpartum.  Baby to Couplet care / Skin to Skin. Placenta to Land D   Plans to Breastfeed Contraception: tubal ligation Circumcision: wants inpatient  Note sent to [facility]: [facility] for pp visit.  Delivery Report:   Review the Delivery Report for details.     Signed: Cathlean Ely, DNP,CNM 09/17/2024, 7:28 AM

## 2023-10-17 ENCOUNTER — Other Ambulatory Visit (HOSPITAL_COMMUNITY): Payer: Self-pay | Attending: Medical Genetics

## 2024-01-28 ENCOUNTER — Ambulatory Visit
Admission: EM | Admit: 2024-01-28 | Discharge: 2024-01-28 | Disposition: A | Discharge status: Home / Self Care | Attending: Family Medicine | Admitting: Family Medicine

## 2024-01-28 DIAGNOSIS — B349 Viral infection, unspecified: Secondary | ICD-10-CM

## 2024-01-28 LAB — POC COVID19/FLU A&B COMBO
Covid Antigen, POC: NEGATIVE
Influenza A Antigen, POC: NEGATIVE
Influenza B Antigen, POC: NEGATIVE

## 2024-01-28 LAB — POCT RAPID STREP A (OFFICE): Rapid Strep A Screen: NEGATIVE

## 2024-01-28 MED ORDER — BENZONATATE 200 MG PO CAPS
200.0000 mg | ORAL_CAPSULE | Freq: Three times a day (TID) | ORAL | 0 refills | Status: DC | PRN
Start: 2024-01-28 — End: 2024-04-01

## 2024-01-28 NOTE — ED Provider Notes (Addendum)
 [facility]    CSN: [id number] Arrival date & time: 01/28/24  1241      History   Chief Complaint Chief Complaint  Patient presents with   Fever   Cough    HPI Barbara Moore is a 33 y.o. female  presents for evaluation of URI symptoms for 4 days. Patient reports associated symptoms of cough, congestion, sore throat, fever/chills, sneezing, fatigue. Denies N/V/D, ear pain, body aches, shortness of breath. Patient does not have a hx of asthma. Patient is not an active smoker.   Reports son has an ear infection..  Pt has taken Tylenol  and Benadryl  and Flonase  OTC for symptoms. Pt has no other concerns at this time.    Fever Associated symptoms: chills, congestion, cough and sore throat   Cough Associated symptoms: chills, fever and sore throat     Past Medical History:  Diagnosis Date   Cancer (HCC)    thyroid    Herpes    Lactose intolerance    Obesity    Seasonal allergies    Sickle cell trait [facility])     Patient Active Problem List   Diagnosis Date Noted   Cancer (HCC)    Postpartum anemia 05/03/2019   Postpartum hemorrhage 05/03/2019   Severe preeclampsia, delivered 05/01/2019   Abnormal Pap smear of cervix 11/11/2018   BMI 45.0-49.9, adult (HCC) 10/28/2018   HSV-2 infection 10/23/2018    Past Surgical History:  Procedure Laterality Date   NO PAST SURGERIES     THYROIDECTOMY      OB History     Gravida  1   Para  1   Term  1   Preterm      AB      Living  1      SAB      IAB      Ectopic      Multiple  0   Live Births  1            Home Medications    Prior to Admission medications   Medication Sig Start Date End Date Taking? Authorizing Provider  benzonatate  (TESSALON ) 200 MG capsule Take 1 capsule (200 mg total) by mouth 3 (three) times daily as needed. 01/28/24  Yes Johaan Ryser, Jodi R, NP  enalapril  (VASOTEC ) 10 MG tablet Take 1 tablet (10 mg total) by mouth daily. 05/05/19   Ervin, Michael L, MD  ibuprofen  (ADVIL ) 600  MG tablet Take 1 tablet (600 mg total) by mouth every 6 (six) hours. 05/04/19   Ervin, Michael L, MD  norelgestromin -ethinyl estradiol  (ORTHO EVRA) 150-35 MCG/24HR transdermal patch Place 1 patch onto the skin once a week. 1 patch weekly for 3 weeks, skip 4th week. Repeat every 4 weeks 07/26/19   Sparacino, Hailey L, DO  norethindrone  (ORTHO MICRONOR ) 0.35 MG tablet Take 1 tablet (0.35 mg total) by mouth daily. 05/21/19   Millard Alliance, PA-C    Family History Family History  Problem Relation Age of Onset   Diabetes Mother    Heart failure Mother    Healthy Father    Learning disabilities Sister     Social History Social History   Tobacco Use   Smoking status: Never   Smokeless tobacco: Never  Vaping Use   Vaping status: Never Used  Substance Use Topics   Alcohol use: Not Currently   Drug use: No     Allergies   Lactose intolerance (gi)   Review of Systems Review of Systems  Constitutional:  Positive for chills, fatigue and fever.  HENT:  Positive for congestion and sore throat.   Respiratory:  Positive for cough.      Physical Exam Triage Vital Signs ED Triage Vitals  Encounter Vitals Group     BP 01/28/24 1251 127/77     Systolic BP Percentile --      Diastolic BP Percentile --      Pulse Rate 01/28/24 1251 (!) 122     Resp 01/28/24 1251 20     Temp 01/28/24 1251 99.4 F (37.4 C)     Temp Source 01/28/24 1251 Oral     SpO2 01/28/24 1251 94 %     Weight --      Height --      Head Circumference --      Peak Flow --      Pain Score 01/28/24 1250 5     Pain Loc --      Pain Education --      Exclude from Growth Chart --    No data found.  Updated Vital Signs BP 127/77   Pulse (!) 122   Temp 99.4 F (37.4 C) (Oral)   Resp 20   LMP 01/15/2024 (Approximate)   SpO2 94%   Visual Acuity Right Eye Distance:   Left Eye Distance:   Bilateral Distance:    Right Eye Near:   Left Eye Near:    Bilateral Near:     Physical Exam Vitals and nursing  note reviewed.  Constitutional:      General: She is not in acute distress.    Appearance: She is well-developed. She is not ill-appearing.  HENT:     Head: Normocephalic and atraumatic.     Right Ear: Tympanic membrane and ear canal normal.     Left Ear: Tympanic membrane and ear canal normal.     Nose: Congestion present.     Mouth/Throat:     Mouth: Mucous membranes are moist.     Pharynx: Oropharynx is clear. Uvula midline. Posterior oropharyngeal erythema present.     Tonsils: No tonsillar exudate or tonsillar abscesses.  Eyes:     Conjunctiva/sclera: Conjunctivae normal.     Pupils: Pupils are equal, round, and reactive to light.  Cardiovascular:     Rate and Rhythm: Regular rhythm. Tachycardia present.     Heart sounds: Normal heart sounds.     Comments: Rate of 122 in setting of low-grade fever Pulmonary:     Effort: Pulmonary effort is normal.     Breath sounds: Normal breath sounds. No wheezing or rhonchi.  Musculoskeletal:     Cervical back: Normal range of motion and neck supple.  Lymphadenopathy:     Cervical: No cervical adenopathy.  Skin:    General: Skin is warm and dry.  Neurological:     General: No focal deficit present.     Mental Status: She is alert and oriented to person, place, and time.  Psychiatric:        Mood and Affect: Mood normal.        Behavior: Behavior normal.      UC Treatments / Results  Labs (all labs ordered are listed, but only abnormal results are displayed) Labs Reviewed  POCT RAPID STREP A (OFFICE) - Normal  POC COVID19/FLU A&B COMBO - Normal   Comprehensive Metabolic Panel Order: [id number] Component Ref Range & Units 5 mo ago  Sodium 136 - 145 mmol/L 141  Potassium 3.5 - 5.1 mmol/L 4  Comment:  NO VISIBLE HEMOLYSIS  Chloride 98 - 107 mmol/L 107  CO2 21 - 31 mmol/L 27  Anion Gap 6 - 14 mmol/L 7  Glucose, Random 70 - 99 mg/dL 98  Blood Urea Nitrogen (BUN) 7 - 25 mg/dL 6 Low   Creatinine 1.61 - 1.20 mg/dL 0.96   eGFR >04 VW/UJW/1.19J4 >90  Comment: GFR estimated by CKD-EPI equations(NKF 2021).  "Recommend confirmation of Cr-based eGFR by using Cys-based eGFR and other filtration markers (if applicable) in complex cases and clinical decision-making, as needed."  Albumin 3.5 - 5.7 g/dL 3.7  Total Protein 6.4 - 8.9 g/dL 6.5  Bilirubin, Total 0.3 - 1.0 mg/dL 0.4  Alkaline Phosphatase (ALP) 34 - 104 U/L 114 High   Aspartate Aminotransferase (AST) 13 - 39 U/L 11 Low   Alanine Aminotransferase (ALT) 7 - 52 U/L 13  Calcium 8.6 - 10.3 mg/dL 9  BUN/Creatinine Ratio   Comment: Creatinine is normal, ratio is not clinically indicated.  Resulting Agency [facility] [place] [facility](CLIA# [id number])   Specimen Collected: 08/26/23 10:03   Performed by: Rosalva Com[facility](CLIA# [id number]) Last Resulted: 08/26/23 14:31    EKG   Radiology No results found.  Procedures Procedures (including critical care time)  Medications Ordered in UC Medications - No data to display  Initial Impression / Assessment and Plan / UC Course  I have reviewed the triage vital signs and the nursing notes.  Pertinent labs & imaging results that were available during my care of the patient were reviewed by me and considered in my medical decision making (see chart for details).  Clinical Course as of 01/28/24 1351  Wed Jan 28, 2024  1348 HR recheck 94 [JM]    Clinical Course User Index [JM] Alleen Arbour, NP    Reviewed exam and symptoms with patient.  No red flags.  Negative rapid strep COVID and flu testing.  Discussed viral illness and symptomatic treatment.  Tessalon  as needed cough.  Advise rest fluids and PCP follow-up 2 to 3 days for recheck.  ER precautions reviewed and patient verbalized understanding. Final Clinical Impressions(s) / UC Diagnoses   Final diagnoses:  Viral illness     Discharge Instructions      You have tested negative for COVID flu and strep  throat.  Please treat your symptoms with over the counter tylenol  or ibuprofen , humidifier, and rest.  You may take Tessalon  as needed for your cough.  Viral illnesses can last 7-14 days. Please follow up with your PCP in 2-3 days for recheck. Please go to the ER for any worsening symptoms. This includes but is not limited to fever you can not control with tylenol  or ibuprofen , you are not able to stay hydrated, you have shortness of breath or chest pain.  Thank you for choosing [facility] for your healthcare needs. I hope you feel better soon!      ED Prescriptions     Medication Sig Dispense Auth. Provider   benzonatate  (TESSALON ) 200 MG capsule Take 1 capsule (200 mg total) by mouth 3 (three) times daily as needed. 20 capsule Sibel Khurana, Jodi R, NP      PDMP not reviewed this encounter.   Alleen Arbour, NP 01/28/24 1351    Alleen Arbour, NP 01/28/24 1352

## 2024-01-28 NOTE — Discharge Instructions (Addendum)
 You have tested negative for COVID flu and strep throat.  Please treat your symptoms with over the counter tylenol  or ibuprofen , humidifier, and rest.  You may take Tessalon  as needed for your cough.  Viral illnesses can last 7-14 days. Please follow up with your PCP in 2-3 days for recheck. Please go to the ER for any worsening symptoms. This includes but is not limited to fever you can not control with tylenol  or ibuprofen , you are not able to stay hydrated, you have shortness of breath or chest pain.  Thank you for choosing [facility] for your healthcare needs. I hope you feel better soon!

## 2024-01-28 NOTE — ED Triage Notes (Signed)
 Pt c/o fevers, cough, sneezing, chills since Sunday.Taking benadryl ,tylenol , flonase .

## 2024-02-23 ENCOUNTER — Encounter (HOSPITAL_BASED_OUTPATIENT_CLINIC_OR_DEPARTMENT_OTHER): Payer: Self-pay

## 2024-02-23 ENCOUNTER — Other Ambulatory Visit: Payer: Self-pay

## 2024-02-23 ENCOUNTER — Emergency Department (HOSPITAL_BASED_OUTPATIENT_CLINIC_OR_DEPARTMENT_OTHER): Admitting: Radiology

## 2024-02-23 DIAGNOSIS — R109 Unspecified abdominal pain: Secondary | ICD-10-CM | POA: Diagnosis not present

## 2024-02-23 DIAGNOSIS — R531 Weakness: Secondary | ICD-10-CM | POA: Diagnosis not present

## 2024-02-23 DIAGNOSIS — O26811 Pregnancy related exhaustion and fatigue, first trimester: Secondary | ICD-10-CM | POA: Insufficient documentation

## 2024-02-23 DIAGNOSIS — Z85850 Personal history of malignant neoplasm of thyroid: Secondary | ICD-10-CM | POA: Diagnosis not present

## 2024-02-23 DIAGNOSIS — Z3A00 Weeks of gestation of pregnancy not specified: Secondary | ICD-10-CM | POA: Insufficient documentation

## 2024-02-23 DIAGNOSIS — O26891 Other specified pregnancy related conditions, first trimester: Secondary | ICD-10-CM | POA: Insufficient documentation

## 2024-02-23 DIAGNOSIS — R0602 Shortness of breath: Secondary | ICD-10-CM | POA: Insufficient documentation

## 2024-02-23 LAB — COMPREHENSIVE METABOLIC PANEL WITH GFR
ALT: 11 U/L (ref 0–44)
AST: 14 U/L — ABNORMAL LOW (ref 15–41)
Albumin: 4 g/dL (ref 3.5–5.0)
Alkaline Phosphatase: 100 U/L (ref 38–126)
Anion gap: 13 (ref 5–15)
BUN: 10 mg/dL (ref 6–20)
CO2: 21 mmol/L — ABNORMAL LOW (ref 22–32)
Calcium: 10 mg/dL (ref 8.9–10.3)
Chloride: 102 mmol/L (ref 98–111)
Creatinine, Ser: 0.73 mg/dL (ref 0.44–1.00)
GFR, Estimated: >60 mL/min (ref >60)
Glucose, Bld: 111 mg/dL — ABNORMAL HIGH (ref 70–99)
Potassium: 4.1 mmol/L (ref 3.5–5.1)
Sodium: 136 mmol/L (ref 135–145)
Total Bilirubin: <0.2 mg/dL (ref 0.0–1.2)
Total Protein: 7.1 g/dL (ref 6.5–8.1)

## 2024-02-23 LAB — CBC
HCT: 35.1 % — ABNORMAL LOW (ref 36.0–46.0)
Hemoglobin: 12.1 g/dL (ref 12.0–15.0)
MCH: 27.5 pg (ref 26.0–34.0)
MCHC: 34.5 g/dL (ref 30.0–36.0)
MCV: 79.8 fL — ABNORMAL LOW (ref 80.0–100.0)
Platelets: 265 10*3/uL (ref 150–400)
RBC: 4.4 MIL/uL (ref 3.87–5.11)
RDW: 15.7 % — ABNORMAL HIGH (ref 11.5–15.5)
WBC: 9.4 10*3/uL (ref 4.0–10.5)
nRBC: 0 % (ref 0.0–0.2)

## 2024-02-23 LAB — URINALYSIS, ROUTINE W REFLEX MICROSCOPIC
Bilirubin Urine: NEGATIVE
Glucose, UA: NEGATIVE mg/dL
Hgb urine dipstick: NEGATIVE
Ketones, ur: NEGATIVE mg/dL
Nitrite: NEGATIVE
Specific Gravity, Urine: 1.029 (ref 1.005–1.030)
pH: 5.5 (ref 5.0–8.0)

## 2024-02-23 LAB — HCG, QUANTITATIVE, PREGNANCY: hCG, Beta Chain, Quant, S: 24589 m[IU]/mL — ABNORMAL HIGH (ref <5)

## 2024-02-23 LAB — TROPONIN T, HIGH SENSITIVITY: Troponin T High Sensitivity: <15 ng/L (ref <19)

## 2024-02-23 LAB — PREGNANCY, URINE: Preg Test, Ur: POSITIVE — AB

## 2024-02-23 LAB — LIPASE, BLOOD: Lipase: 17 U/L (ref 11–51)

## 2024-02-23 MED ORDER — ONDANSETRON 4 MG PO TBDP
4.0000 mg | ORAL_TABLET | Freq: Once | ORAL | Status: AC | PRN
  Administered 2024-02-23: 4 mg via ORAL
  Filled 2024-02-23: qty 1

## 2024-02-23 NOTE — ED Triage Notes (Signed)
 Patient states she feels extremely fatigue,winded, headache, nausea, abdominal pain. States the symptoms started last week. States this has never happened before. Denies taking blood thinners. Hx of thyroid  cancer.

## 2024-02-24 ENCOUNTER — Ambulatory Visit (HOSPITAL_BASED_OUTPATIENT_CLINIC_OR_DEPARTMENT_OTHER): Admission: RE | Admit: 2024-02-24 | Source: Ambulatory Visit

## 2024-02-24 ENCOUNTER — Emergency Department (HOSPITAL_BASED_OUTPATIENT_CLINIC_OR_DEPARTMENT_OTHER)
Admission: EM | Admit: 2024-02-24 | Discharge: 2024-02-24 | Disposition: A | Discharge status: Home / Self Care | Attending: Emergency Medicine | Admitting: Emergency Medicine

## 2024-02-24 DIAGNOSIS — O26811 Pregnancy related exhaustion and fatigue, first trimester: Secondary | ICD-10-CM

## 2024-02-24 NOTE — ED Provider Notes (Signed)
 [place] EMERGENCY DEPARTMENT AT [facility] Provider Note   CSN: [id number] Arrival date & time: 02/23/24  2130     History  Chief Complaint  Patient presents with   Shortness of Breath    Barbara Moore is a 33 y.o. female.  Patient is a 33 year old female with past medical history of thyroid  cancer status post thyroidectomy, lactose intolerance.  Patient presenting today for evaluation of weakness and fatigue.  This has been ongoing for the past week.  She also describes some abdominal discomfort, but no fevers or chills.  No diarrhea or constipation.  Last menstrual period was last month and normal.  She denies any leg pain or leg swelling.  No chest pain.       Home Medications Prior to Admission medications   Medication Sig Start Date End Date Taking? Authorizing Provider  benzonatate  (TESSALON ) 200 MG capsule Take 1 capsule (200 mg total) by mouth 3 (three) times daily as needed. 01/28/24   Mayer, Jodi R, NP  enalapril  (VASOTEC ) 10 MG tablet Take 1 tablet (10 mg total) by mouth daily. 05/05/19   Ervin, Michael L, MD  ibuprofen  (ADVIL ) 600 MG tablet Take 1 tablet (600 mg total) by mouth every 6 (six) hours. 05/04/19   Ervin, Michael L, MD  norelgestromin -ethinyl estradiol  (ORTHO EVRA) 150-35 MCG/24HR transdermal patch Place 1 patch onto the skin once a week. 1 patch weekly for 3 weeks, skip 4th week. Repeat every 4 weeks 07/26/19   Sparacino, Hailey L, DO  norethindrone  (ORTHO MICRONOR ) 0.35 MG tablet Take 1 tablet (0.35 mg total) by mouth daily. 05/21/19   Wenzel, Julie N, PA-C      Allergies    Lactose intolerance (gi)    Review of Systems   Review of Systems  All other systems reviewed and are negative.   Physical Exam Updated Vital Signs BP (!) 125/91 (BP Location: Right Arm)   Pulse (!) 110   Temp 97.8 F (36.6 C)   Resp 20   Ht 5\' 6"  (1.676 m)   Wt (!) 158.8 kg   LMP 01/12/2024 (Approximate)   SpO2 99%   BMI 56.49 kg/m  Physical Exam Vitals and  nursing note reviewed.  Constitutional:      General: She is not in acute distress.    Appearance: She is well-developed. She is not diaphoretic.  HENT:     Head: Normocephalic and atraumatic.  Cardiovascular:     Rate and Rhythm: Normal rate and regular rhythm.     Heart sounds: No murmur heard.    No friction rub. No gallop.  Pulmonary:     Effort: Pulmonary effort is normal. No respiratory distress.     Breath sounds: Normal breath sounds. No wheezing.  Abdominal:     General: Bowel sounds are normal. There is no distension.     Palpations: Abdomen is soft.     Tenderness: There is no abdominal tenderness.  Musculoskeletal:        General: Normal range of motion.     Cervical back: Normal range of motion and neck supple.  Skin:    General: Skin is warm and dry.  Neurological:     General: No focal deficit present.     Mental Status: She is alert and oriented to person, place, and time.     ED Results / Procedures / Treatments   Labs (all labs ordered are listed, but only abnormal results are displayed) Labs Reviewed  COMPREHENSIVE METABOLIC PANEL WITH GFR -  Abnormal; Notable for the following components:      Result Value   CO2 21 (*)    Glucose, Bld 111 (*)    AST 14 (*)    All other components within normal limits  CBC - Abnormal; Notable for the following components:   HCT 35.1 (*)    MCV 79.8 (*)    RDW 15.7 (*)    All other components within normal limits  URINALYSIS, ROUTINE W REFLEX MICROSCOPIC - Abnormal; Notable for the following components:   Protein, ur TRACE (*)    Leukocytes,Ua TRACE (*)    Bacteria, UA RARE (*)    All other components within normal limits  PREGNANCY, URINE - Abnormal; Notable for the following components:   Preg Test, Ur POSITIVE (*)    All other components within normal limits  HCG, QUANTITATIVE, PREGNANCY - Abnormal; Notable for the following components:   hCG, Beta Chain, Quant, S 24,589 (*)    All other components within  normal limits  LIPASE, BLOOD  TROPONIN T, HIGH SENSITIVITY  TROPONIN T, HIGH SENSITIVITY    EKG   Radiology DG Chest 2 View Result Date: 02/23/2024 CLINICAL DATA:  Dyspnea EXAM: CHEST - 2 VIEW COMPARISON:  None Available. FINDINGS: The heart size and mediastinal contours are within normal limits. Both lungs are clear. The visualized skeletal structures are unremarkable. IMPRESSION: No active cardiopulmonary disease. Electronically Signed   By: Worthy Heads M.D.   On: 02/23/2024 22:33    Procedures Procedures    Medications Ordered in ED Medications  ondansetron  (ZOFRAN -ODT) disintegrating tablet 4 mg (4 mg Oral Given 02/23/24 2158)    ED Course/ Medical Decision Making/ A&P  The patient is a 33 year old female presenting with fatigue, weakness as described in the HPI.  Patient arrives with stable vital signs and is afebrile.  Physical examination basically unremarkable.  Laboratory studies obtained including CBC, CMP, lipase, urinalysis, and quantitative hCG.  Patient has no leukocytosis, no anemia, normal electrolytes.  She does have a positive pregnancy test with quantitative hCG of 25,000.  Chest x-ray is clear.  Patient does complain of some left-sided abdominal discomfort, but no vaginal bleeding and will make arrangements for an outpatient ultrasound in the morning.  I suspect patient's symptoms are likely hormonal and related to early pregnancy.  I have considered other causes such as pulmonary embolism, but doubt this possibility.  She will be discharged with as needed return.  Final Clinical Impression(s) / ED Diagnoses Final diagnoses:  None    Rx / DC Orders ED Discharge Orders     None         Orvilla Blander, MD 02/24/24 [phone]

## 2024-02-24 NOTE — Discharge Instructions (Signed)
 Return tomorrow at the given time for an ultrasound to further evaluate your pregnancy.  Return to the emergency department if you develop severe chest pain, worsening breathing, high fevers, or for other new and concerning symptoms.

## 2024-02-25 ENCOUNTER — Other Ambulatory Visit (HOSPITAL_BASED_OUTPATIENT_CLINIC_OR_DEPARTMENT_OTHER): Payer: Self-pay | Admitting: Emergency Medicine

## 2024-02-25 ENCOUNTER — Ambulatory Visit (HOSPITAL_BASED_OUTPATIENT_CLINIC_OR_DEPARTMENT_OTHER)
Admission: RE | Admit: 2024-02-25 | Discharge: 2024-02-25 | Disposition: A | Discharge status: Home / Self Care | Source: Ambulatory Visit | Attending: Emergency Medicine | Admitting: Emergency Medicine

## 2024-02-25 DIAGNOSIS — O26811 Pregnancy related exhaustion and fatigue, first trimester: Secondary | ICD-10-CM

## 2024-02-25 NOTE — ED Provider Notes (Signed)
 1:22 PM Patient returned to get her ultrasound this morning as ordered by the team before last.  Patient had ultrasound that showed a single intrauterine live pregnancy at 6 weeks and 4 days.  Patient reports minimal symptoms now with nausea and discomfort and she will call her OB/GYN for follow-up.  She was well-appearing and agree with close follow-up.  She will start prenatal vitamins and understood return precautions.  Patient was given the results and was able to go home.     Isaiha Asare, Marine Sia, MD 02/25/24 1322

## 2024-03-23 ENCOUNTER — Telehealth: Payer: Self-pay

## 2024-03-23 DIAGNOSIS — Z3689 Encounter for other specified antenatal screening: Secondary | ICD-10-CM | POA: Diagnosis not present

## 2024-03-23 DIAGNOSIS — O0991 Supervision of high risk pregnancy, unspecified, first trimester: Secondary | ICD-10-CM

## 2024-03-23 DIAGNOSIS — Z13228 Encounter for screening for other metabolic disorders: Secondary | ICD-10-CM | POA: Insufficient documentation

## 2024-03-23 DIAGNOSIS — O0990 Supervision of high risk pregnancy, unspecified, unspecified trimester: Secondary | ICD-10-CM | POA: Insufficient documentation

## 2024-03-23 NOTE — Patient Instructions (Signed)
 Safe Medications in Pregnancy   Acne:  Benzoyl Peroxide  Salicylic Acid   Backache/Headache:  Tylenol: 2 regular strength every 4 hours OR               2 Extra strength every 6 hours   Colds/Coughs/Allergies:  Benadryl (alcohol free) 25 mg every 6 hours as needed  Breath right strips  Claritin  Cepacol throat lozenges  Chloraseptic throat spray  Cold-Eeze- up to three times per day  Cough drops, alcohol free  Flonase (by prescription only)  Guaifenesin  Mucinex  Robitussin DM (plain only, alcohol free)  Saline nasal spray/drops  Sudafed (pseudoephedrine) & Actifed * use only after [redacted] weeks gestation and if you do not have high blood pressure  Tylenol  Vicks Vaporub  Zinc lozenges  Zyrtec   Constipation:  Colace  Ducolax suppositories  Fleet enema  Glycerin suppositories  Metamucil  Milk of magnesia  Miralax  Senokot  Smooth move tea   Diarrhea:  Kaopectate  Imodium A-D   *NO pepto Bismol   Hemorrhoids:  Anusol  Anusol HC  Preparation H  Tucks   Indigestion:  Tums  Maalox  Mylanta  Zantac  Pepcid   Insomnia:  Benadryl (alcohol free) 25mg  every 6 hours as needed  Tylenol PM  Unisom, no Gelcaps   Leg Cramps:  Tums  MagGel   Nausea/Vomiting:  Bonine  Dramamine  Emetrol  Ginger extract  Sea bands  Meclizine  Nausea medication to take during pregnancy:  Unisom (doxylamine succinate 25 mg tablets) Take one tablet daily at bedtime. If symptoms are not adequately controlled, the dose can be increased to a maximum recommended dose of two tablets daily (1/2 tablet in the morning, 1/2 tablet mid-afternoon and one at bedtime).  Vitamin B6 100mg  tablets. Take one tablet twice a day (up to 200 mg per day).   Skin Rashes:  Aveeno products  Benadryl cream or 25mg  every 6 hours as needed  Calamine Lotion  1% cortisone cream   Yeast infection:  Gyne-lotrimin 7  Monistat 7    **If taking multiple medications, please check labels to avoid  duplicating the same active ingredients  **take medication as directed on the label  ** Do not exceed 4000 mg of tylenol in 24 hours  **Do not take medications that contain aspirin or ibuprofen            Considering Waterbirth? Guide for patients at [facility]) Why consider waterbirth? Gentle birth for babies  Less pain medicine used in labor  May allow for passive descent/less pushing  May reduce perineal tears  More mobility and instinctive maternal position changes  Increased maternal relaxation   Is waterbirth safe? What are the risks of infection, drowning or other complications? Infection:  Very low risk (3.7 % for tub vs 4.8% for bed)  7 in 8000 waterbirths with documented infection  Poorly cleaned equipment most common cause  Slightly lower group B strep transmission rate  Drowning  Maternal:  Very low risk  Related to seizures or fainting  Newborn:  Very low risk. No evidence of increased risk of respiratory problems in multiple large studies  Physiological protection from breathing under water  Avoid underwater birth if there are any fetal complications  Once baby's head is out of the water, keep it out.  Birth complication  Some reports of cord trauma, but risk decreased by bringing baby to surface gradually  No evidence of increased risk of shoulder dystocia. Mothers  can usually change positions faster in water than in a bed, possibly aiding the maneuvers to free the shoulder.   There are 2 things you MUST do to have a waterbirth with [facility]: Attend a waterbirth class at [facility] at [facility]   3rd Wednesday of every month from 7-9 pm (virtual during COVID) [employer] at [email or web address] or [email or web address] or by calling [phone] Bring [place] the certificate from the class to your prenatal appointment or send via MyChart Meet with a midwife at 36 weeks* to see if you can still plan a  waterbirth and to sign the consent.   *We also recommend that you schedule as many of your prenatal visits with a midwife as possible.    Helpful information: You may want to bring a bathing suit top to the hospital to wear during labor but this is optional.  All other supplies are provided by the hospital. Please arrive at the hospital with signs of active labor, and do not wait at home until late in labor. It takes 45 min- 1 hour for fetal monitoring, and check in to your room to take place, plus transport and filling of the waterbirth tub.    Things that would prevent you from having a waterbirth: Premature, <37wks  Previous cesarean birth  Presence of thick meconium-stained fluid  Multiple gestation (Twins, triplets, etc.)  Uncontrolled diabetes or gestational diabetes requiring medication  Hypertension diagnosed in pregnancy or preexisting hypertension (gestational hypertension, preeclampsia, or chronic hypertension) Fetal growth restriction (your baby measures less than 10th percentile on ultrasound) Heavy vaginal bleeding  Non-reassuring fetal heart rate  Active infection (MRSA, etc.). Group B Strep is NOT a contraindication for waterbirth.  If your labor has to be induced and induction method requires continuous monitoring of the baby's heart rate  Other risks/issues identified by your obstetrical provider   Please remember that birth is unpredictable. Under certain unforeseeable circumstances your provider may advise against giving birth in the tub. These decisions will be made on a case-by-case basis and with the safety of you and your baby as our highest priority.    Updated 01/09/22

## 2024-03-23 NOTE — Progress Notes (Signed)
 New OB Intake  I connected with Barbara Moore  on 03/23/24 at  1:15 PM EDT by MyChart Video Visit and verified that I am speaking with the correct person using two identifiers. Nurse is located at [facility] and pt is located at home.  I discussed the limitations, risks, security and privacy concerns of performing an evaluation and management service by telephone and the availability of in person appointments. I also discussed with the patient that there may be a patient responsible charge related to this service. The patient expressed understanding and agreed to proceed.  I explained I am completing New OB Intake today. We discussed EDD of Not found.. Pt is G1P1001. I reviewed her allergies, medications and Medical/Surgical/OB history.    Patient Active Problem List   Diagnosis Date Noted   Cancer [facility])    Postpartum anemia 05/03/2019   Postpartum hemorrhage 05/03/2019   Severe preeclampsia, delivered 05/01/2019   Abnormal Pap smear of cervix 11/11/2018   BMI 45.0-49.9, adult (HCC) 10/28/2018   HSV-2 infection 10/23/2018     Concerns addressed today  Delivery Plans Plans to deliver at [facility]. Discussed the nature of our practice with multiple providers including residents and students. Due to the size of the practice, the delivering provider may not be the same as those providing prenatal care.   Patient is interested in water birth.  MyChart/Babyscripts MyChart access verified. I explained pt will have some visits in office and some virtually. Babyscripts instructions given and order placed. Patient verifies receipt of registration text/e-mail. Account successfully created and app downloaded. If patient is a candidate for Optimized scheduling, add to sticky note.   Blood Pressure Cuff/Weight Scale Patient is self-pay; explained patient will be given BP cuff at first prenatal appt. Explained after first prenatal appt pt will check weekly and document in Babyscripts.  Anatomy  US  Explained first scheduled US  will be around 19 weeks. Anatomy US  scheduled for 06/01/2024 at 10:00am.  Is patient a CenteringPregnancy candidate?  Declined Declined due to Declined to say   Is patient a Mom+Baby Combined Care candidate?  Not a candidate   If accepted, confirm patient does not intend to move from the area for at least 12 months, then notify Mom+Baby staff  Is patient a candidate for Babyscripts Optimization?   First visit review I reviewed new OB appt with patient. Explained pt will be seen by Susi Eric, FNP at first visit. Discussed [place] genetic screening with patient. Panorama and Horizon.. Routine prenatal labs is needed at new ob visit.  Last Pap Diagnosis  Date Value Ref Range Status  10/28/2018 (A)  Final   ATYPICAL SQUAMOUS CELLS OF UNDETERMINED SIGNIFICANCE (ASC-US ).    [name], CMA 03/23/2024  1:26 PM

## 2024-03-30 ENCOUNTER — Other Ambulatory Visit: Payer: Self-pay

## 2024-03-30 ENCOUNTER — Ambulatory Visit (INDEPENDENT_AMBULATORY_CARE_PROVIDER_SITE_OTHER): Payer: Self-pay | Admitting: Obstetrics and Gynecology

## 2024-03-30 ENCOUNTER — Other Ambulatory Visit (HOSPITAL_COMMUNITY)
Admission: RE | Admit: 2024-03-30 | Discharge: 2024-03-30 | Disposition: A | Discharge status: Home / Self Care | Payer: Self-pay | Source: Ambulatory Visit | Attending: Obstetrics and Gynecology | Admitting: Obstetrics and Gynecology

## 2024-03-30 ENCOUNTER — Encounter: Payer: Self-pay | Admitting: Obstetrics and Gynecology

## 2024-03-30 VITALS — BP 122/79 | HR 102 | Wt 344.0 lb

## 2024-03-30 DIAGNOSIS — C73 Malignant neoplasm of thyroid gland: Secondary | ICD-10-CM

## 2024-03-30 DIAGNOSIS — E890 Postprocedural hypothyroidism: Secondary | ICD-10-CM | POA: Diagnosis not present

## 2024-03-30 DIAGNOSIS — O0990 Supervision of high risk pregnancy, unspecified, unspecified trimester: Secondary | ICD-10-CM | POA: Insufficient documentation

## 2024-03-30 DIAGNOSIS — F419 Anxiety disorder, unspecified: Secondary | ICD-10-CM

## 2024-03-30 DIAGNOSIS — Z3A11 11 weeks gestation of pregnancy: Secondary | ICD-10-CM | POA: Diagnosis not present

## 2024-03-30 DIAGNOSIS — Z8759 Personal history of other complications of pregnancy, childbirth and the puerperium: Secondary | ICD-10-CM

## 2024-03-30 DIAGNOSIS — B009 Herpesviral infection, unspecified: Secondary | ICD-10-CM

## 2024-03-30 DIAGNOSIS — O0991 Supervision of high risk pregnancy, unspecified, first trimester: Secondary | ICD-10-CM

## 2024-03-30 MED ORDER — ASPIRIN 81 MG PO TBEC: 162.0000 mg | DELAYED_RELEASE_TABLET | Freq: Every day | ORAL | 2 refills | Status: DC

## 2024-03-30 NOTE — Progress Notes (Unsigned)
 INITIAL PRENATAL VISIT  Subjective:   Barbara Moore is being seen today for her first obstetrical visit.   She is at [redacted]w[redacted]d gestation by LMP.  Her obstetrical history is significant for {ob risk factors:10154}. Relationship with [name]: not involved . Patient does intend to breast feed. Pregnancy history fully reviewed.  Patient reports {sx:14538}.  Indications for ASA therapy (per uptodate)  Two or more of the following: Nulliparity {yes/no:20286} Obesity (body mass index >30 kg/m2) {yes/no:20286} Family history of preeclampsia in mother or sister {yes/no:20286} Age >=35 years {yes/no:20286} Sociodemographic characteristics (African American race, low socioeconomic level) {yes/no:20286} Personal risk factors (eg, previous pregnancy with low birth weight or small for gestational age infant, previous adverse pregnancy outcome [eg, stillbirth], interval >10 years between pregnancies) {yes/no:20286}   Objective:    Obstetric History OB History  Gravida Para Term Preterm AB Living  2 1 1   1   SAB IAB Ectopic Multiple Live Births     0 1    # Outcome Date GA Lbr Len/2nd Weight Sex Type Anes PTL Lv  2 Current           1 Term 05/02/19 [redacted]w[redacted]d 07:00 / 00:26 7 lb 14.3 oz (3.581 kg) M Vag-Spont EPI  LIV     Birth Comments: WNL    Past Medical History:  Diagnosis Date   Abnormal Pap smear of cervix 11/11/2018   ASCUS +HPV 10/2018     Cancer (HCC)    thyroid    Encounter for screening for other metabolic disorders 03/23/2024   Herpes    Lactose intolerance    Morbid obesity (HCC) 12/28/2021   Obesity    Possible exposure to STD 02/21/2021   Postoperative hypothyroidism 05/13/2022   Postpartum anemia 05/03/2019   Postpartum hemorrhage 05/03/2019   Seasonal allergies    Severe preeclampsia, delivered 05/01/2019   Sickle cell trait (HCC)     Past Surgical History:  Procedure Laterality Date   NO PAST SURGERIES     THYROIDECTOMY      Current Outpatient Medications on File  Prior to Visit  Medication Sig Dispense Refill   ibuprofen  (ADVIL ) 600 MG tablet Take 1 tablet (600 mg total) by mouth every 6 (six) hours. 30 tablet 0   levothyroxine (SYNTHROID) 200 MCG tablet Take 200 mcg by mouth.     benzonatate  (TESSALON ) 200 MG capsule Take 1 capsule (200 mg total) by mouth 3 (three) times daily as needed. 20 capsule 0   enalapril  (VASOTEC ) 10 MG tablet Take 1 tablet (10 mg total) by mouth daily. (Patient not taking: Reported on 03/30/2024) 30 tablet 1   norelgestromin -ethinyl estradiol  (ORTHO EVRA) 150-35 MCG/24HR transdermal patch Place 1 patch onto the skin once a week. 1 patch weekly for 3 weeks, skip 4th week. Repeat every 4 weeks 3 patch 12   norethindrone  (ORTHO MICRONOR ) 0.35 MG tablet Take 1 tablet (0.35 mg total) by mouth daily. 1 Package 11   venlafaxine XR (EFFEXOR-XR) 150 MG 24 hr capsule Take 150 mg by mouth daily.     No current facility-administered medications on file prior to visit.    Allergies  Allergen Reactions   Lactose Intolerance (Gi) Diarrhea and Nausea And Vomiting   Other Other (See Comments)    n/a  Lactose intolerant    Social History:  reports that she has never smoked. She has never used smokeless tobacco. She reports that she does not currently use alcohol. She reports that she does not use drugs.  Family History  Problem  Relation Age of Onset   Diabetes Mother    Heart failure Mother    Heart failure Father    Learning disabilities Sister     The following portions of the patient's history were reviewed and updated as appropriate: allergies, current medications, past family history, past medical history, past social history, past surgical history and problem list.  Review of Systems Review of Systems    Physical Exam:  BP 122/79   Pulse (!) 102   Wt (!) 344 lb (156 kg)   LMP 01/12/2024 (Approximate)   BMI 55.52 kg/m  CONSTITUTIONAL: Well-developed, well-nourished female in no acute distress.  HENT:  Normocephalic,  atraumatic.   EYES: Conjunctivae normal. NECK: Normal range of motion SKIN: Skin is warm and dry. MUSCULOSKELETAL: Normal range of motion NEUROLOGIC: Alert and oriented  PSYCHIATRIC: Normal mood and affect. Normal behavior.  CARDIOVASCULAR: Normal heart rate noted RESPIRATORY: normal effort ABDOMEN: Soft PELVIC:deferred      Movement: Absent       Assessment:    Pregnancy: G2P1001  1. Supervision of high risk pregnancy in first trimester (Primary) ***  2. [redacted] weeks gestation of pregnancy ***  3. 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 Post-operative hypothyroidism ***      Plan:     Initial labs drawn. Prenatal vitamins. Problem list reviewed and updated. Reviewed in detail the nature of the practice with collaborative care between  Genetic screening discussed: NIPS/First trimester screen/Quad/AFP ordered. Role of ultrasound in pregnancy discussed; Anatomy US : ordered. Discussed clinic routines, schedule of care and testing, genetic screening options, involvement of students and residents under the direct supervision of APPs and doctors and presence of female providers. Pt verbalized understanding.  Future Appointments  Date Time Provider Department Center  06/01/2024 10:00 AM [facility] PROVIDER 1 [facility]  06/01/2024 10:30 AM [facility] US5 [facility]    [name], FNP

## 2024-03-30 NOTE — Patient Instructions (Signed)
 We highly recommend childbirth education to help you plan for labor and begin practicing coping skills (which will be needed with or without pain meds).  [place] Childbirth Education Options: Sign up by visiting [email or web address]  Childbirth ~ Self-Paced eClass (English and Spanish) This online class offers you the freedom to complete a childbirth education series in the comfort of your own home at your own pace.  Childbirth Class (In-Person 4-Week Series  or on Saturdays, Virtual 4-Week Series ~ [place]) This interactive in-person class series will help you and your partner prepare for your birth experience. Topics include: Labor & Birth, Comfort Measures, Breathing Techniques, Massage, Medical Interventions, Pain Management Options, Cesarean Birth, Postpartum Care, and Newborn Care  Comfort Techniques for Labor ~ In-Person Class [facility]) This interactive class is designed for parents-to-be who want to learn & practice hands-on skills to help relieve some of the discomfort of labor and encourage their babies to rotate toward the best position for birth. Moms and their partners will be able to try a variety of labor positions with birth balls and rebozos as well as practice breathing, relaxation, and visualization techniques.  Natural Childbirth Class (In-Person 5-Week Series, In-Person on Saturdays or Virtual 5-Week Series ~ [place]) This class series is designed for expectant parents who want to learn and practice natural methods of coping with the process of labor and childbirth.  Cesarean Birth Self-Paced eClass (English and Spanish) This online course provides comprehensive information you can trust as you prepare for a possible cesarean birth. In this class, you'll learn how to make your birth and recovery comfortable and joyful through instructive video clips, animations, and activities.  Waterbirth ~ Airline pilot Interested in a waterbirth? In addition to a consultation  with your credentialed waterbirth provider, this free, informational online class will help you discover whether waterbirth is the right fit for you. Not all obstetrical practices offer waterbirth, so check with your healthcare provider.  Tour Probation officer) - [facility] [employer] our 4 minute video tour of [facility] located in [place].   [place] Parenting Education Options:  Pregnancy 101 (Virtual) Congratulations on your pregnancy! This class is geared toward moms in their first trimester, but everyone is welcome. We are excited to guide you through all aspects of supporting a healthy pregnancy. You will learn what to expect at routine prenatal care appointments, common postpartum adjustments, basic infant safety, and breastfeeding.  Successful Partnering & Parenting ~ In-Person Workshop [facility]) This workshop inspires and equips partners of all economic levels, ages, and cultures to confidently care for their infants, support the birthing persons, and navigate their own transformations into new partners and parents. Learning activities are geared towards supporting partner, but moms are welcome to attend.  'Baby & Me' Parenting Group (Virtual on Wednesdays at 11am) Enjoy this time discussing newborn & infant parenting topics and family adjustment issues with other new parents in a relaxed environment. Each week brings a new speaker or baby-centered activity. This group offers support and connection to parents as they journey through the adjustments and struggles of that sometimes overwhelming first year after the birth of a child.  Baby Safety, CPR, & Choking Class ~ Virtual This life-saving information is meant to encourage parents as they learn important safety and prevention tips as well as infant CPR and relief of choking.  Breastfeeding Class (In-Person in [place] or [employer]) Families learn what to expect in the  first days and weeks of breastfeeding your  newborn. IF YOU ARE AN EMPLOYEE TAKING THIS CLASS FOR CREDIT, DO NOT register yourself. Please e-mail [email or web address].   Breastfeeding Self-Paced eClass (English & Spanish) Families learn what to expect in the first days and weeks of breastfeeding your newborn.  Caring for Baby ~ In-Person, Virtual or Self-Paced Class This in-person class is for both expectant and adoptive parents who want to learn and practice the most up-to-date newborn care for their babies. Focus is on birth through the first six weeks of life.  CPR & Choking Relief for Infants & Children ~ In-Person Class [facility]) This in-person course is designed for any parent, expectant parent, or adult who cares for infants or children. Participants learn and demonstrate cardiopulmonary resuscitation and choking relief procedures for both infants and children.  Grandparent Love ~ In-Person Class Grandparents will learn the most updated infant care and safety recommendations. They will discover ways to support their own children during the transition into the parenting role and receive tips on communicating with the new parents.  [place] Parenting Support Group Options:  Bereavement Grief Support Group (Pregnancy/Infant Loss) - Virtual This is an ongoing experience that meets once a month and is designed to help you honor the past, assist you in discovering tools to strengthen you today, and aid you in developing hope for the future.  Breastfeeding & Pumping Support Group (In-Person on Thursdays at 12pm or Virtual on Tuesdays at 5pm) Join [place] in-person each Thursday starting June 1st, 2023 at 12pm! This support group is free for all families looking for breastfeeding and/or pumping support.   Community-Based Childbirth Education Options:  [facility] Department Classes:  Childbirth education classes can help you get ready for a positive parenting experience. You  can also meet other expectant parents and get free stuff for your baby. Each class runs for five weeks on the same night and costs $45 for the mother-to-be and her support person. Medicaid covers the cost if you are eligible. Call [phone] to register.  [facility] [employer] offers a variety of programs for the [employer] and is another great way to get connected. Please go to [email or web address] for more information.  Childbirth With A Twist! Be informed of your options, get educated on birth, understand what your body is doing, learn how to cope, and have a lot of fun and laughs all while doing it either from the comfort of your couch OR in our cozy office and classroom space near the [place] airport. If you are taking a virtual class, then class is taught LIVE, so you can ask questions and receive answers in real-time from an experienced doula and childbirth educator.  This virtual childbirth education class will meet for five instruction times online.  Although we are based in [place], [place], this virtual class is open to anyone in the world. Please visit: [email or web address] for more information.  Books We Love: The Doula Guide to Childbirth by Harland German and Otila Back The First-Time Parent's Childbirth Handbook by Dr. Amie Critchley, CNM The Birth Partner by Truddie Crumble

## 2024-03-31 ENCOUNTER — Ambulatory Visit: Payer: Self-pay | Admitting: Obstetrics and Gynecology

## 2024-03-31 LAB — CMP14+EGFR
ALT: 23 IU/L (ref 0–32)
AST: 12 IU/L (ref 0–40)
Albumin: 3.9 g/dL (ref 3.9–4.9)
Alkaline Phosphatase: 95 IU/L (ref 44–121)
BUN/Creatinine Ratio: 14 (ref 9–23)
BUN: 8 mg/dL (ref 6–20)
Bilirubin Total: <0.2 mg/dL (ref 0.0–1.2)
CO2: 19 mmol/L — ABNORMAL LOW (ref 20–29)
Calcium: 9.4 mg/dL (ref 8.7–10.2)
Chloride: 103 mmol/L (ref 96–106)
Creatinine, Ser: 0.57 mg/dL (ref 0.57–1.00)
Globulin, Total: 2.7 g/dL (ref 1.5–4.5)
Glucose: 106 mg/dL — ABNORMAL HIGH (ref 70–99)
Potassium: 3.8 mmol/L (ref 3.5–5.2)
Sodium: 137 mmol/L (ref 134–144)
Total Protein: 6.6 g/dL (ref 6.0–8.5)
eGFR: 125 mL/min/{1.73_m2} (ref >59)

## 2024-03-31 LAB — CBC/D/PLT+RPR+RH+ABO+RUBIGG...
Antibody Screen: NEGATIVE
Basophils Absolute: 0 10*3/uL (ref 0.0–0.2)
Basos: 0 %
EOS (ABSOLUTE): 0.1 10*3/uL (ref 0.0–0.4)
Eos: 1 %
HCV Ab: NONREACTIVE
HIV Screen 4th Generation wRfx: NONREACTIVE
Hematocrit: 35.4 % (ref 34.0–46.6)
Hemoglobin: 12.1 g/dL (ref 11.1–15.9)
Hepatitis B Surface Ag: NEGATIVE
Immature Grans (Abs): 0 10*3/uL (ref 0.0–0.1)
Immature Granulocytes: 0 %
Lymphocytes Absolute: 1.7 10*3/uL (ref 0.7–3.1)
Lymphs: 23 %
MCH: 29.4 pg (ref 26.6–33.0)
MCHC: 34.2 g/dL (ref 31.5–35.7)
MCV: 86 fL (ref 79–97)
Monocytes Absolute: 0.2 10*3/uL (ref 0.1–0.9)
Monocytes: 3 %
Neutrophils Absolute: 5.5 10*3/uL (ref 1.4–7.0)
Neutrophils: 73 %
Platelets: 400 10*3/uL (ref 150–450)
RBC: 4.12 x10E6/uL (ref 3.77–5.28)
RDW: 14.9 % (ref 11.7–15.4)
RPR Ser Ql: NONREACTIVE
Rh Factor: POSITIVE
Rubella Antibodies, IGG: 1.26 {index} (ref >0.99)
WBC: 7.5 10*3/uL (ref 3.4–10.8)

## 2024-03-31 LAB — HEMOGLOBIN A1C
Est. average glucose Bld gHb Est-mCnc: 108 mg/dL
Hgb A1c MFr Bld: 5.4 % (ref 4.8–5.6)

## 2024-03-31 LAB — PROTEIN / CREATININE RATIO, URINE
Creatinine, Urine: 155.1 mg/dL
Protein, Ur: 13.7 mg/dL
Protein/Creat Ratio: 88 mg/g{creat} (ref 0–200)

## 2024-03-31 LAB — HCV INTERPRETATION

## 2024-04-01 DIAGNOSIS — E890 Postprocedural hypothyroidism: Secondary | ICD-10-CM | POA: Insufficient documentation

## 2024-04-01 HISTORY — DX: Postprocedural hypothyroidism: E89.0

## 2024-04-01 LAB — GC/CHLAMYDIA PROBE AMP (~~LOC~~) NOT AT ARMC
Chlamydia: NEGATIVE
Comment: NEGATIVE
Comment: NORMAL
Neisseria Gonorrhea: NEGATIVE

## 2024-04-01 LAB — URINE CULTURE, OB REFLEX

## 2024-04-01 LAB — CULTURE, OB URINE

## 2024-04-05 LAB — PANORAMA PRENATAL TEST FULL PANEL:PANORAMA TEST PLUS 5 ADDITIONAL MICRODELETIONS: FETAL FRACTION: 4.1

## 2024-04-20 ENCOUNTER — Inpatient Hospital Stay (HOSPITAL_BASED_OUTPATIENT_CLINIC_OR_DEPARTMENT_OTHER)

## 2024-04-20 ENCOUNTER — Inpatient Hospital Stay (HOSPITAL_COMMUNITY)
Admission: AD | Admit: 2024-04-20 | Discharge: 2024-04-21 | Disposition: A | Discharge status: Home / Self Care | Attending: Obstetrics and Gynecology | Admitting: Obstetrics and Gynecology

## 2024-04-20 DIAGNOSIS — O4692 Antepartum hemorrhage, unspecified, second trimester: Secondary | ICD-10-CM

## 2024-04-20 DIAGNOSIS — O0990 Supervision of high risk pregnancy, unspecified, unspecified trimester: Secondary | ICD-10-CM

## 2024-04-20 DIAGNOSIS — Z3A14 14 weeks gestation of pregnancy: Secondary | ICD-10-CM | POA: Insufficient documentation

## 2024-04-20 DIAGNOSIS — O209 Hemorrhage in early pregnancy, unspecified: Secondary | ICD-10-CM

## 2024-04-20 DIAGNOSIS — D573 Sickle-cell trait: Secondary | ICD-10-CM

## 2024-04-20 LAB — URINALYSIS, ROUTINE W REFLEX MICROSCOPIC
Bilirubin Urine: NEGATIVE
Glucose, UA: NEGATIVE mg/dL
Ketones, ur: NEGATIVE mg/dL
Leukocytes,Ua: NEGATIVE
Nitrite: NEGATIVE
Protein, ur: NEGATIVE mg/dL
Specific Gravity, Urine: 1.02 (ref 1.005–1.030)
pH: 5 (ref 5.0–8.0)

## 2024-04-20 NOTE — MAU Note (Addendum)
 Barbara Moore is a 33 y.o. at [redacted]w[redacted]d here in MAU reporting heavy bleeding since 2015. Having cramping that feels like menstrual cramps. Pt wore in pad that had scant amt bleeding in Triage.   LMP: n/a Onset of complaint: 2015 Pain score: 6 Vitals:   04/20/24 2121 04/20/24 2125  BP:  135/80  Pulse: 96   Resp: 20   Temp: 98.5 F (36.9 C)   SpO2: 100%      FHT: did not listen due to pt's habitus and early gestation  Lab orders placed from triage: u/a

## 2024-04-21 DIAGNOSIS — Z3A14 14 weeks gestation of pregnancy: Secondary | ICD-10-CM

## 2024-04-21 DIAGNOSIS — O209 Hemorrhage in early pregnancy, unspecified: Secondary | ICD-10-CM

## 2024-04-21 NOTE — MAU Provider Note (Signed)
 Chief Complaint: Abdominal Pain and Vaginal Bleeding   Event Date/Time   First Provider Initiated Contact with Patient 04/21/24 0104        SUBJECTIVE HPI: Barbara Moore is a 33 y.o. G2P1001 at [redacted]w[redacted]d by LMP who presents to maternity admissions reporting heavy bleeding with uterine cramping since 2015.  States only a small spot of blood on pad now.  She denies vaginal itching/burning, urinary symptoms, h/a, dizziness, n/v, or fever/chills.     Abdominal Pain This is a new problem. The current episode started today. The problem has been waxing and waning. The pain is located in the suprapubic region. The quality of the pain is cramping. The abdominal pain does not radiate. Nothing aggravates the pain. The pain is relieved by Nothing. She has tried nothing for the symptoms.  Vaginal Bleeding The patient's primary symptoms include vaginal bleeding. The patient's pertinent negatives include no genital odor. The current episode started today. The problem has been resolved. Associated symptoms include abdominal pain. The vaginal discharge was bloody. She has not been passing clots. She has not been passing tissue.   RN Note Barbara Moore is a 33 y.o. at [redacted]w[redacted]d here in MAU reporting heavy bleeding since 2015. Having cramping that feels like menstrual cramps. Pt wore in pad that had scant amt bleeding in Triage.  Onset of complaint: 2015  Pain score: 6  Past Medical History:  Diagnosis Date   Abnormal Pap smear of cervix 11/11/2018   ASCUS +HPV 10/2018     Cancer (HCC)    thyroid    Encounter for screening for other metabolic disorders 03/23/2024   Herpes    Lactose intolerance    Morbid obesity (HCC) 12/28/2021   Obesity    Possible exposure to STD 02/21/2021   Postoperative hypothyroidism 05/13/2022   Postpartum anemia 05/03/2019   Postpartum hemorrhage 05/03/2019   Seasonal allergies    Severe preeclampsia, delivered 05/01/2019   Sickle cell trait (HCC)    Past Surgical History:   Procedure Laterality Date   NO PAST SURGERIES     THYROIDECTOMY     Social History   Socioeconomic History   Marital status: Single    Spouse name: Not on file   Number of children: Not on file   Years of education: Not on file   Highest education level: Not on file  Occupational History   Occupation: Tech support    Employer: [employer]  Tobacco Use   Smoking status: Never   Smokeless tobacco: Never  Vaping Use   Vaping status: Never Used  Substance and Sexual Activity   Alcohol use: Not Currently   Drug use: No   Sexual activity: Yes    Birth control/protection: None  Other Topics Concern   Not on file  Social History Narrative   Not on file   Social Drivers of Health   Financial Resource Strain: Not on file  Food Insecurity: Food Insecurity Present (03/30/2024)   Hunger Vital Sign    Worried About Running Out of Food in the Last Year: Often true    Ran Out of Food in the Last Year: Often true  Transportation Needs: No Transportation Needs (03/30/2024)   PRAPARE - Administrator, Civil Service (Medical): No    Lack of Transportation (Non-Medical): No  Physical Activity: Not on file  Stress: Not on file  Social Connections: Unknown (11/07/2022)   [facility]   Social Network    Social Network: Not on file  Intimate Partner  Violence: Unknown (11/07/2022)   [facility]   HITS    Physically Hurt: Not on file    Insult or Talk Down To: Not on file    Threaten Physical Harm: Not on file    Scream or Curse: Not on file   No current facility-administered medications on file prior to encounter.   Current Outpatient Medications on File Prior to Encounter  Medication Sig Dispense Refill   aspirin  EC 81 MG tablet Take 2 tablets (162 mg total) by mouth daily. Start taking when you are [redacted] weeks pregnant for rest of pregnancy for prevention of preeclampsia 300 tablet 2   ibuprofen  (ADVIL ) 600 MG tablet Take 1 tablet (600 mg total) by  mouth every 6 (six) hours. 30 tablet 0   levothyroxine (SYNTHROID) 200 MCG tablet Take 200 mcg by mouth.     venlafaxine XR (EFFEXOR-XR) 150 MG 24 hr capsule Take 150 mg by mouth daily.     Allergies  Allergen Reactions   Lactose Intolerance (Gi) Diarrhea and Nausea And Vomiting   Other Other (See Comments)    n/a  Lactose intolerant    I have reviewed patient's Past Medical Hx, Surgical Hx, Family Hx, Social Hx, medications and allergies.   ROS:  Review of Systems  Gastrointestinal:  Positive for abdominal pain.  Genitourinary:  Positive for vaginal bleeding.   Review of Systems  Other systems negative   Physical Exam  Physical Exam Patient Vitals for the past 24 hrs:  BP Temp Pulse Resp SpO2 Height Weight  04/20/24 2125 135/80 -- -- -- -- -- --  04/20/24 2121 -- 98.5 F (36.9 C) 96 20 100 % 5' 6 (1.676 m) (!) 157.4 kg   Constitutional: Well-developed, well-nourished female in no acute distress.  Cardiovascular: normal rate Respiratory: normal effort GI: Abd soft, non-tender.  MS: Extremities nontender, no edema, normal ROM Neurologic: Alert and oriented x 4.  GU: Neg CVAT.  PELVIC EXAM:  Speculum exam:  Cervix closed and long                              No blood visualized on exam  LAB RESULTS Results for orders placed or performed during the hospital encounter of 04/20/24 (from the past 24 hours)  Urinalysis, Routine w reflex microscopic -Urine, Clean Catch     Status: Abnormal   Collection Time: 04/20/24  9:35 PM  Result Value Ref Range   Color, Urine YELLOW YELLOW   APPearance CLEAR CLEAR   Specific Gravity, Urine 1.020 1.005 - 1.030   pH 5.0 5.0 - 8.0   Glucose, UA NEGATIVE NEGATIVE mg/dL   Hgb urine dipstick LARGE (A) NEGATIVE   Bilirubin Urine NEGATIVE NEGATIVE   Ketones, ur NEGATIVE NEGATIVE mg/dL   Protein, ur NEGATIVE NEGATIVE mg/dL   Nitrite NEGATIVE NEGATIVE   Leukocytes,Ua NEGATIVE NEGATIVE   RBC / HPF 0-5 0 - 5 RBC/hpf   WBC, UA 0-5 0 - 5  WBC/hpf   Bacteria, UA RARE (A) NONE SEEN   Squamous Epithelial / HPF 0-5 0 - 5 /HPF   Mucus PRESENT     A/Positive/-- (06/24 1600)  IMAGING:  ULTRASOUND: This patient presented to the MAU due to vaginal bleeding.    A limited ultrasound performed today shows a viable  singleton intrauterine gestation with normal amniotic fluid.    A normal-appearing posterior placenta is noted.   MAU Management/MDM: I have reviewed the triage vital  signs and the nursing notes.   Pertinent labs & imaging results that were available during my care of the patient were reviewed by me and considered in my medical decision making (see chart for details).      I have reviewed her medical records including past results, notes and treatments. Medical, Surgical, and family history were reviewed.  Medications and recent lab tests were reviewed .   Treatments in MAU included SPeculum exam and ultrasound. No blood noted on exam.  Placenta normal on US . .      ASSESSMENT [place] IUP at [redacted]w[redacted]d Bleeding second trimester  PLAN Discharge home Bleeding precautions Pelvic rest  Pt stable at time of discharge. Encouraged to return here if she develops worsening of symptoms, increase in pain, fever, or other concerning symptoms.    Earnie Pouch CNM, MSN Certified Nurse-Midwife 04/21/2024  1:04 AM

## 2024-04-21 NOTE — Progress Notes (Signed)
 Earnie Pouch CNM in [facility] RM to see pt and discuss test results. Written and verbal d/c instructions given and understanding voiced.

## 2024-04-21 NOTE — MAU Note (Signed)
 PT stated she needed to leave and take her brother her car and she could come back for results. Pt told that would be fine. Earnie Pouch CNM aware

## 2024-04-23 ENCOUNTER — Inpatient Hospital Stay (HOSPITAL_COMMUNITY)
Admission: AD | Admit: 2024-04-23 | Discharge: 2024-04-23 | Disposition: A | Discharge status: Home / Self Care | Payer: Self-pay | Attending: Obstetrics and Gynecology | Admitting: Obstetrics and Gynecology

## 2024-04-23 ENCOUNTER — Encounter (HOSPITAL_COMMUNITY): Payer: Self-pay | Admitting: Obstetrics and Gynecology

## 2024-04-23 DIAGNOSIS — O99212 Obesity complicating pregnancy, second trimester: Secondary | ICD-10-CM | POA: Insufficient documentation

## 2024-04-23 DIAGNOSIS — L259 Unspecified contact dermatitis, unspecified cause: Secondary | ICD-10-CM | POA: Diagnosis not present

## 2024-04-23 DIAGNOSIS — O99712 Diseases of the skin and subcutaneous tissue complicating pregnancy, second trimester: Secondary | ICD-10-CM | POA: Diagnosis not present

## 2024-04-23 DIAGNOSIS — Z3A14 14 weeks gestation of pregnancy: Secondary | ICD-10-CM | POA: Diagnosis not present

## 2024-04-23 DIAGNOSIS — O209 Hemorrhage in early pregnancy, unspecified: Secondary | ICD-10-CM | POA: Insufficient documentation

## 2024-04-23 DIAGNOSIS — D573 Sickle-cell trait: Secondary | ICD-10-CM | POA: Insufficient documentation

## 2024-04-23 DIAGNOSIS — O09292 Supervision of pregnancy with other poor reproductive or obstetric history, second trimester: Secondary | ICD-10-CM | POA: Diagnosis not present

## 2024-04-23 DIAGNOSIS — Z6841 Body Mass Index (BMI) 40.0 and over, adult: Secondary | ICD-10-CM | POA: Diagnosis not present

## 2024-04-23 DIAGNOSIS — R519 Headache, unspecified: Secondary | ICD-10-CM | POA: Diagnosis present

## 2024-04-23 DIAGNOSIS — O26892 Other specified pregnancy related conditions, second trimester: Secondary | ICD-10-CM | POA: Diagnosis not present

## 2024-04-23 DIAGNOSIS — O99012 Anemia complicating pregnancy, second trimester: Secondary | ICD-10-CM | POA: Insufficient documentation

## 2024-04-23 NOTE — OB Triage Provider Note (Addendum)
 Chief Complaint: Headache, Urticaria, seeing spots, and Vaginal Bleeding     SUBJECTIVE HPI: Barbara Moore is a 33 y.o. G2P1001 at [redacted]w[redacted]d by LMP, early ultrasound who presents to maternity admissions reporting hives.  She woke up this morning and noticed some areas of itchiness and rash on her buttocks.  She did not think much of it until she went to work and the rash grew and spread and increased in itchiness.  She previously was taking allergy medications but has not taken them consistently for the last week, she did not try any medications prior to presenting to the MAU.  Denies throat itchiness, shortness of breath, insect bite, or exposure to new/unusual plant or substance.  No other family members at the home with similar symptoms.  She was previously evaluated for vaginal bleeding which has decreased and slowed down since last MAU visit Tuesday, She denies vaginal itching/burning, urinary symptoms, h/a, dizziness, n/v, or fever/chills.    HPI  Past Medical History:  Diagnosis Date   Abnormal Pap smear of cervix 11/11/2018   ASCUS +HPV 10/2018     Cancer (HCC)    thyroid    Encounter for screening for other metabolic disorders 03/23/2024   Herpes    Lactose intolerance    Morbid obesity (HCC) 12/28/2021   Obesity    Possible exposure to STD 02/21/2021   Postoperative hypothyroidism 05/13/2022   Postpartum anemia 05/03/2019   Postpartum hemorrhage 05/03/2019   Seasonal allergies    Severe preeclampsia, delivered 05/01/2019   Sickle cell trait (HCC)    Past Surgical History:  Procedure Laterality Date   NO PAST SURGERIES     THYROIDECTOMY     Social History   Socioeconomic History   Marital status: Single    Spouse name: Not on file   Number of children: Not on file   Years of education: Not on file   Highest education level: Not on file  Occupational History   Occupation: Tech support    Employer: [employer]  Tobacco Use   Smoking status: Never   Smokeless tobacco:  Never  Vaping Use   Vaping status: Never Used  Substance and Sexual Activity   Alcohol use: Not Currently   Drug use: No   Sexual activity: Yes    Birth control/protection: None  Other Topics Concern   Not on file  Social History Narrative   Not on file   Social Drivers of Health   Financial Resource Strain: Not on file  Food Insecurity: Food Insecurity Present (03/30/2024)   Hunger Vital Sign    Worried About Running Out of Food in the Last Year: Often true    Ran Out of Food in the Last Year: Often true  Transportation Needs: No Transportation Needs (03/30/2024)   PRAPARE - Administrator, Civil Service (Medical): No    Lack of Transportation (Non-Medical): No  Physical Activity: Not on file  Stress: Not on file  Social Connections: Unknown (11/07/2022)   Received from [facility]   Social Network    Social Network: Not on file  Intimate Partner Violence: Unknown (11/07/2022)   Received from [facility]   HITS    Physically Hurt: Not on file    Insult or Talk Down To: Not on file    Threaten Physical Harm: Not on file    Scream or Curse: Not on file   No current facility-administered medications on file prior to encounter.   Current Outpatient Medications on File Prior to Encounter  Medication Sig Dispense Refill   aspirin  EC 81 MG tablet Take 2 tablets (162 mg total) by mouth daily. Start taking when you are [redacted] weeks pregnant for rest of pregnancy for prevention of preeclampsia 300 tablet 2   levothyroxine (SYNTHROID) 200 MCG tablet Take 200 mcg by mouth.     Prenatal Vit-Fe Fumarate-FA (MULTIVITAMIN-PRENATAL) 27-0.8 MG TABS tablet Take 1 tablet by mouth daily at 12 noon.     venlafaxine XR (EFFEXOR-XR) 150 MG 24 hr capsule Take 150 mg by mouth daily.     Allergies  Allergen Reactions   Lactose Intolerance (Gi) Diarrhea and Nausea And Vomiting   Other Other (See Comments)    n/a  Lactose intolerant    ROS:  Pertinent positives/negatives listed  above.  I have reviewed patient's Past Medical Hx, Surgical Hx, Family Hx, Social Hx, medications and allergies.   Physical Exam  Patient Vitals for the past 24 hrs:  BP Temp Temp src Pulse Resp SpO2 Height Weight  04/23/24 1143 133/69 -- -- 93 -- -- -- --  04/23/24 1118 139/76 98.4 F (36.9 C) Oral 90 18 99 % 5' 6 (1.676 m) (!) 155.7 kg   Constitutional: Well-developed, obese female in no acute distress.  Cardiovascular: normal rate Respiratory: normal effort, cta GI: Abd soft, non-tender. Pos BS x 4 MS: Wheals with flare varying in size from 1 to 5 cm on inner thigh, groin area, and posterior arms and back.  Extremities nontender, no edema, normal ROM Neurologic: Alert and oriented x 4.  GU: Neg CVAT.  Doppler: Could not find heart tones due to increased  maternal habitus BMI  55  Limited US : Pt informed that the ultrasound is considered a limited OB ultrasound and is not intended to be a complete ultrasound exam.  Patient also informed that the ultrasound is not being completed with the intent of assessing for fetal or placental anomalies or any pelvic abnormalities.  Explained that the purpose of today's ultrasound is to assess for  fetal heart tones due to pt body habitus.  Patient acknowledges the purpose of the exam and the limitations of the study.  FHR 162.   LAB RESULTS No results found for this or any previous visit (from the past 24 hours).  A/Positive/-- (06/24 1600)  IMAGING US  MFM OB Limited Result Date: 04/21/2024 ----------------------------------------------------------------------  OBSTETRICS REPORT                       (Signed Final 04/21/2024 11:17 am) ---------------------------------------------------------------------- Patient Info  ID #:       [id number]                          D.O.B.:  30-Dec-1990 (32 yrs)(F)  Name:       Barbara Moore                   Visit Date: 04/20/2024 09:53 pm ----------------------------------------------------------------------  Performed By  Attending:        Steffan Keys MD         Ref. Address:     [address]  [place], [place]                                                             [address]  Performed By:     Burnard LITTIE Custard          Secondary Phy.:   [facility] MAU/Triage                    RDMS  Referred By:      EARNIE LITTIE                [facility] ---------------------------------------------------------------------- Orders  #  Description                           Code        Ordered By  1  US  MFM OB LIMITED                     76815.01    EARNIE TRUDY ----------------------------------------------------------------------  #  Order #                     Accession #                Episode #  1  [id number]                   [id number]                 [id number] ---------------------------------------------------------------------- Indications  Vaginal bleeding in pregnancy, second          O46.92  trimester  [redacted] weeks gestation of pregnancy                Z3A.14 ---------------------------------------------------------------------- Fetal Evaluation  Num Of Fetuses:         1  Fetal Heart Rate(bpm):  170  Cardiac Activity:       Observed  Presentation:           Cephalic  Placenta:               Posterior  P. Cord Insertion:      Visualized  Amniotic Fluid  AFI FV:      Within normal limits                              Largest Pocket(cm)                              2.5  Comment:    No placental abruption identified. ---------------------------------------------------------------------- OB History  Gravidity:    2         Term:   1        Prem:   0        SAB:   0  TOP:  0       Ectopic:  0        Living: 1 ---------------------------------------------------------------------- Gestational Age  LMP:           14w 1d        Date:  01/12/24                   EDD:   10/18/24  Best:          14w  1d     Det. By:  LMP  (01/12/24)          EDD:   10/18/24 ---------------------------------------------------------------------- Anatomy  Stomach:               Appears normal, left   Bladder:                Appears normal                         sided  Other:  Technically difficult due to maternal habitus and early GA. ---------------------------------------------------------------------- Cervix Uterus Adnexa  Cervix  Closed  Uterus  No abnormality visualized.  Right Ovary  Not visualized.  Left Ovary  Not visualized.  Adnexa  No abnormality visualized ---------------------------------------------------------------------- Comments  This patient presented to the MAU due to vaginal bleeding.  A limited ultrasound performed today shows a viable  singleton intrauterine gestation with normal amniotic fluid.  A normal-appearing posterior placenta is noted. ----------------------------------------------------------------------                   Steffan Keys, MD Electronically Signed Final Report   04/21/2024 11:17 am ----------------------------------------------------------------------    MAU Management/MDM:  Moderate   - Prenatal records reviewed - Physical exam performed - Bedside ultrasound performed - Plan for Benadryl  d/t likely Contact dermatitis   Patient reports she would rather go home and take benadryl  since she does not have a driver here and she has Benadryl  at home.   Contact dermatitis: Given the acute onset and rapid progression of wheals and flares, and patient history of previous hives this is the leading diagnosis.  Low concern for anaphylactic reaction, given stable vitals, clear lungs, and no audible wheezing, no previous history of anaphylaxis.   Orders Placed This Encounter  Procedures   Discharge patient Discharge disposition: 01-Home or Self Care; Discharge patient date: 04/23/2024      ASSESSMENT 1. Contact dermatitis, unspecified contact dermatitis type, unspecified trigger    2. [redacted] weeks gestation of pregnancy   3. BMI 50.0-59.9, adult [facility])     PLAN Discharge home with strict return precautions. -Instructed patient to take OTC Benadryl  every 4 hours as needed  -Start daily OTC allergy medication, reportedly taking Allegra  Allergies as of 04/23/2024       Reactions   Lactose Intolerance (gi) Diarrhea, Nausea And Vomiting   Other Other (See Comments)   n/a  Lactose intolerant        Medication List     TAKE these medications    aspirin  EC 81 MG tablet Take 2 tablets (162 mg total) by mouth daily. Start taking when you are [redacted] weeks pregnant for rest of pregnancy for prevention of preeclampsia   levothyroxine 200 MCG tablet Commonly known as: SYNTHROID Take 200 mcg by mouth.   multivitamin-prenatal 27-0.8 MG Tabs tablet Take 1 tablet by mouth daily at 12 noon.   venlafaxine XR 150 MG 24 hr capsule Commonly known as: EFFEXOR-XR Take 150 mg by mouth  daily.        Follow-up Information     [facility] Follow up.   Specialty: Obstetrics and Gynecology Why: If symptoms worsen or fail to resolve, As scheduled for ongoing prenatal care Contact information: [address]  [address] [phone]                Fairy Amy, MD [facility] Resident, PGY-1 04/23/2024  12:11 PM    Attestation of Supervision of Student:  I confirm that I have verified the information documented in the Resident Physician's  note and that I have also personally reperformed the history, physical exam and all medical decision making activities.  I have verified that all services and findings are accurately documented in this student's note; and I agree with management and plan as outlined in the documentation. I have also made any necessary editorial changes.  Bedside ultrasound was performed by me and interpreted by me. FREDRIK Dalton, NP  Olam DELENA Dalton,  NP [facility] [employer], [facility] 04/23/2024 2:13 PM

## 2024-04-23 NOTE — MAU Note (Signed)
 Provider aware triage RN unable to doppler fht's. Pt reports they have not been able to doppler at office visits, reports they have to look with u/s

## 2024-04-23 NOTE — MAU Note (Signed)
 Barbara Moore is a 33 y.o. at [redacted]w[redacted]d here in MAU reporting: was here on Tues for bleeding like a period'.  Was told everything was ok, has had spotting since, light pink. No clots.  Hives started this morning, body, legs and arms.  Itching. Did not take anything for it.  Denies any new meds, soaps, creams, detergents.  Raised welts noted.  Itches. Went on to work.  Stares at a computer screen, has been seeing black floaters for a couple wks, seems to be getting worse. Gives her headaches and makes her feel dizzy.   Onset of complaint: ongoing, hives today Pain score: mild HA Vitals:   04/23/24 1118  BP: 139/76  Pulse: 90  Resp: 18  Temp: 98.4 F (36.9 C)  SpO2: 99%     QYU:lwjaoz to hear doppler Lab orders placed from triage:

## 2024-04-23 NOTE — Discharge Instructions (Signed)

## 2024-04-27 ENCOUNTER — Encounter: Payer: Self-pay | Admitting: Obstetrics and Gynecology

## 2024-04-28 ENCOUNTER — Ambulatory Visit: Admitting: Clinical

## 2024-04-28 DIAGNOSIS — F4322 Adjustment disorder with anxiety: Secondary | ICD-10-CM | POA: Diagnosis not present

## 2024-04-28 NOTE — Patient Instructions (Signed)
 [facility] [address] [place], [place] [address] [phone] (main office) [phone] [facility] office)  [email or web address]     BRAINSTORMING  Develop a Plan Goals: Provide a way to start conversation about your new life with a baby Assist parents in recognizing and using resources within their reach Help pave the way before birth for an easier period of transition afterwards.  Make a list of the following information to keep in a central location: Full name of Mom and Partner: _____________________________________________ Baby's full name and Date of Birth: ___________________________________________ Home Address: ___________________________________________________________ ________________________________________________________________________ Home Phone: ____________________________________________________________ Parents' cell numbers: _____________________________________________________ ________________________________________________________________________ Name and contact info for OB: ______________________________________________ Name and contact info for Pediatrician:________________________________________ Contact info for Lactation Consultants: ________________________________________  REST and SLEEP *You each need at least 4-5 hours of uninterrupted sleep every day. Write specific names and contact information.* How are you going to rest in the postpartum period? While partner's home? When partner returns to work? When you both return to work? Where will your baby sleep? Who is available to help during the day? Evening? Night? Who could move in for a period to help support you? What are some ideas to help you get enough  sleep? __________________________________________________________________________________________________________________________________________________________________________________________________________________________________________ NUTRITIOUS FOOD AND DRINK *Plan for meals before your baby is born so you can have healthy food to eat during the immediate postpartum period.* Who will look after breakfast? Lunch? Dinner? List names and contact information. Brainstorm quick, healthy ideas for each meal. What can you do before baby is born to prepare meals for the postpartum period? How can others help you with meals? Which grocery stores provide online shopping and delivery? Which restaurants offer take-out or delivery options? ______________________________________________________________________________________________________________________________________________________________________________________________________________________________________________________________________________________________________________________________________________________________________________________________________  CARE FOR MOM *It's important that mom is cared for and pampered in the postpartum period. Remember, the most important ways new mothers need care are: sleep, nutrition, gentle exercise, and time off.* Who can come take care of mom during this period? Make a list of people with their contact information. List some activities that make you feel cared for, rested, and energized? Who can make sure you have opportunities to do these things? Does mom have a space of her very own within your home that's just for her? Make a "[facility]" where she can be comfortable, rest, and renew herself  daily. ______________________________________________________________________________________________________________________________________________________________________________________________________________________________________________________________________________________________________________________________________________________________________________________________________    CARE FOR AND FEEDING BABY *Knowledgeable and encouraging people will offer the best support with regard to feeding your baby.* Educate yourself and choose the best feeding option for your baby. Make a list of people who will guide, support, and be a resource for you as your care for and feed your baby. (Friends that have breastfed or are currently breastfeeding, lactation consultants, breastfeeding support groups, etc.) Consider a postpartum doula. (These websites can give you information: dona.org & https://shea.org/) Seek out local breastfeeding resources like the breastfeeding support group at [employer] or [employer]. ______________________________________________________________________________________________________________________________________________________________________________________________________________________________________________________________________________________________________________________________________________________________________________________________________  Judson Roch AND ERRANDS Who can help with a thorough cleaning before baby is born? Make a list of people who will help with housekeeping and chores, like laundry, light cleaning, dishes, bathrooms, etc. Who can run some errands for you? What can you do to make sure you are stocked with basic supplies before baby is born? Who is going to do the  shopping? ______________________________________________________________________________________________________________________________________________________________________________________________________________________________________________________________________________________________________________________________________________________________________________________________________     Family Adjustment *Nurture yourselves.it helps parents be more loving and allows for better bonding with their child.* What sorts of things do you and  partner enjoy doing together? Which activities help you to connect and strengthen your relationship? Make a list of those things. Make a list of people whom you trust to care for your baby so you can have some time together as a couple. What types of things help partner feel connected to Mom? Make a list. What needs will partner have in order to bond with baby? Other children? Who will care for them when you go into labor and while you are in the hospital? Think about what the needs of your older children might be. Who can help you meet those needs? In what ways are you helping them prepare for bringing baby home? List some specific strategies you have for family adjustment. _______________________________________________________________________________________________________________________________________________________________________________________________________________________________________________________________________________________________________________________________________________  SUPPORT *Someone who can empathize with experiences normalizes your problems and makes them more bearable.* Make a list of other friends, neighbors, and/or co-workers you know with infants (and small children, if applicable) with whom you can connect. Make a list of local or online support groups, mom groups, etc. in which you can be  involved. ______________________________________________________________________________________________________________________________________________________________________________________________________________________________________________________________________________________________________________________________________________________________________________________________________  Childcare Plans Investigate and plan for childcare if mom is returning to work. Talk about mom's concerns about her transition back to work. Talk about partner's concerns regarding this transition.  Mental Health *Your mental health is one of the highest priorities for a pregnant or postpartum mom.* 1 in 5 women experience anxiety and/or depression from the time of conception through the first year after birth. Postpartum Mood Disorders are the #1 complication of pregnancy and childbirth and the suffering experienced by these mothers is not necessary! These illnesses are temporary and respond well to treatment, which often includes self-care, social support, talk therapy, and medication when needed. Women experiencing anxiety and depression often say things like: "I'm supposed to be happy.why do I feel so sad?", "Why can't I snap out of it?", "I'm having thoughts that scare me." There is no need to be embarrassed if you are feeling these symptoms: Overwhelmed, anxious, angry, sad, guilty, irritable, hopeless, exhausted but can't sleep You are NOT alone. You are NOT to blame. With help, you WILL be well. Where can I find help? Medical professionals such as your OB, midwife, gynecologist, family practitioner, primary care provider, pediatrician, or mental health providers; [facility] support groups: Feelings After Birth, Breastfeeding Support Group, Baby and Me Group, and Fit 4 Two exercise classes. You have permission to ask for help. It will confirm your feelings, validate your experiences,  share/learn coping strategies, and gain support and encouragement as you heal. You are important! BRAINSTORM Make a list of local resources, including resources for mom and for partner. Identify support groups. Identify people to call late at night - include names and contact info. Talk with partner about perinatal mood and anxiety disorders. Talk with your OB, midwife, and doula about baby blues and about perinatal mood and anxiety disorders. Talk with your pediatrician about perinatal mood and anxiety disorders.   Support & Sanity Savers   What do you really need?  Basics In preparing for a new baby, many expectant parents spend hours shopping for baby clothes, decorating the nursery, and deciding which car seat to buy. Yet most don't think much about what the reality of parenting a newborn will be like, and what they need to make it through that. So, here is the advice of experienced parents. We know you'll read this, and think "they're exaggerating, I don't really need that." Just trust [place] on these, OK? Plan for  all of this, and if it turns out you don't need it, come back and teach [place] how you did it!  Must-Haves (Once baby's survival needs are met, make sure you attend to your own survival needs!) Sleep An average newborn sleeps 16-18 hours per day, over 6-7 sleep periods, rarely more than three hours at a time. It is normal and healthy for a newborn to wake throughout the night... but really hard on parents!! Naps. Prioritize sleep above any responsibilities like: cleaning house, visiting friends, running errands, etc.  Sleep whenever baby sleeps. If you can't nap, at least have restful times when baby eats. The more rest you get, the more patient you will be, the more emotionally stable, and better at solving problems.  Food You may not have realized it would be difficult to eat when you have a newborn. Yet, when we talk to countless new parents, they say things like "it may be 2:00 pm  when I realize I haven't had breakfast yet." Or "every time we sit down to dinner, baby needs to eat, and my food gets cold, so I don't bother to eat it." Finger food. Before your baby is born, stock up with one months' worth of food that: 1) you can eat with one hand while holding a baby, 2) doesn't need to be prepped, 3) is good hot or cold, 4) doesn't spoil when left out for a few hours, and 5) you like to eat. Think about: nuts, dried fruit, Clif bars, pretzels, jerky, gogurt, baby carrots, apples, bananas, crackers, cheez-n-crackers, string cheese, hot pockets or frozen burritos to microwave, garden burgers and breakfast pastries to put in the toaster, yogurt drinks, etc. Restaurant Menus. Make lists of your favorite restaurants & menu items. When family/friends want to help, you can give specific information without much thought. They can either bring you the food or send gift cards for just the right meals. Freezer Meals.  Take some time to make a few meals to put in the freezer ahead of time.  Easy to freeze meals can be anything such as soup, lasagna, chicken pie, or spaghetti sauce. Set up a Meal Schedule.  Ask friends and family to sign up to bring you meals during the first few weeks of being home. (It can be passed around at baby showers!) You have no idea how helpful this will be until you are in the throes of parenting.  Machine[employer].it is a great website to check out. Emotional Support Know who to call when you're stressed out. Parenting a newborn is very challenging work. There are times when it totally overwhelms your normal coping abilities. EVERY NEW PARENT NEEDS TO HAVE A PLAN FOR WHO TO CALL WHEN THEY JUST CAN'T COPE ANY MORE. (And it has to be someone other than the baby's other parent!) Before your baby is born, come up with at least one person you can call for support - write their phone number down and post it on the refrigerator. Anxiety & Sadness. Baby blues are normal after  pregnancy; however, there are more severe types of anxiety & sadness which can occur and should not be ignored.  They are always treatable, but you have to take the first step by reaching out for help. [facility] offers a "Mom Talk" group which meets every Tuesday from 10 am - 11 am.  This group is for new moms who need support and connection after their babies are born.  Call [phone].  Really, Really Helpful (Plan  for them! Make sure these happen often!!) Physical Support with Taking Care of Yourselves Asking friends and family. Before your baby is born, set up a schedule of people who can come and visit and help out (or ask a friend to schedule for you). Any time someone says "let me know what I can do to help," sign them up for a day. When they get there, their job is not to take care of the baby (that's your job and your joy). Their job is to take care of you!  Postpartum doulas. If you don't have anyone you can call on for support, look into postpartum doulas:  professionals at helping parents with caring for baby, caring for themselves, getting breastfeeding started, and helping with household tasks. www.padanc.org is a helpful website for learning about doulas in our area. Peer Support / Parent Groups Why: One of the greatest ideas for new parents is to be around other new parents. Parent groups give you a chance to share and listen to others who are going through the same season of life, get a sense of what is normal infant development by watching several babies learn and grow, share your stories of triumph and struggles with empathetic ears, and forgive your own mistakes when you realize all parents are learning by trial and error. Where to find: There are many places you can meet other new parents throughout our community.  [facility] offers the following classes for new moms and their little ones:  Baby and Me (Birth to Crawling) and Breastfeeding Support Group. Go to  [email or web address] or call [phone] for more information. Time for your Relationship It's easy to get so caught up in meeting baby's immediate needs that it's hard to find time to connect with your partner, and meet the needs of your relationship. It's also easy to forget what "quality time with your partner" actually looks like. If you take your baby on a date, you'd be amazed how much of your couple time is spent feeding the baby, diapering the baby, admiring the baby, and talking about the baby. Dating: Try to take time for just the two of you. Babysitter tip: Sometimes when moms are breastfeeding a newborn, they find it hard to figure out how to schedule outings around baby's unpredictable feeding schedules. Have the babysitter come for a three hour period. When she comes over, if baby has just eaten, you can leave right away, and come back in two hours. If baby hasn't fed recently, you start the date at home. Once baby gets hungry and gets a good feeding in, you can head out for the rest of your date time. Date Nights at Home: If you can't get out, at least set aside one evening a week to prioritize your relationship: whenever baby dozes off or doesn't have any immediate needs, spend a little time focusing on each other. Potential conflicts: The main relationship conflicts that come up for new parents are: issues related to sexuality, financial stresses, a feeling of an unfair division of household tasks, and conflicts in parenting styles. The more you can work on these issues before baby arrives, the better!  Fun and Frills (Don't forget these. and don't feel guilty for indulging in them!) Everyone has something in life that is a fun little treat that they do just for themselves. It may be: reading the morning paper, or going for a daily jog, or having coffee with a friend once a week, or going to a movie on  Friday nights, or fine chocolates, or bubble baths, or curling up with a good  book. Unless you do fun things for yourself every now and then, it's hard to have the energy for fun with your baby. Whatever your "special" treats are, make sure you find a way to continue to indulge in them after your baby is born. These special moments can recharge you, and allow you to return to baby with a new joy   PERINATAL MOOD DISORDERS: MATERNAL MENTAL HEALTH FROM CONCEPTION THROUGH THE POSTPARTUM PERIOD   _________________________________________Emergency and Crisis Resources If you are an imminent risk to self or others, are experiencing intense personal distress, and/or have noticed significant changes in activities of daily living, call:  [facility]: [phone]  [address], [place], [place], [address] Mobile Crisis: [phone] National Suicide Hotline: 61 Or visit the following crisis centers: Local Emergency Departments [facility]:  [address], [place]  Mon-Friday 8am-3pm, [phone]                                                                                  ___________ Non-Crisis Resources To identify specific providers that are covered by your insurance, contact your insurance company or local agencies:   Postpartum Support International- Warm-line: 7738497160                                                      __Outpatient Therapy and Medication Management   Providers:  [facility]: [phone] Hours: 9AM-5PM  Insurance Accepted: Pilar Jarvis, BCBS, Crista Luria, [place], Medicare  [employer] [facility]): [phone] Hours: 8AM-5:30PM  nsurance Accepted: All insurances EXCEPT AARP, [place], [place], and [employer] of the [place]: [phone] Hours: 8AM-8PM Insurance Accepted: Ulla Gallo, Crista Luria, [place], Medicare, [phone] [facility]: [phone] Hours: 8:30AM-7PM Insurance Accepted: Ulla Gallo, Medicaid, Medicare, Tricare, [employer] [facility]:  [phone]   Hours:9AM-5PM Insurance Accepted:  Monia Pouch, Ezequiel Essex, [employer], [place], [employer] Care  [facility]: [phone] Hours: 9AM-5:30PM Insurance Accepted: Pilar Jarvis, Teodoro Spray, and Medicaid, Medicare, [facility]:  [phone] Hours: 9am-5pm Insurance Accepted: BCBS; they do not accept Medicaid/Medicare [facility]: [phone] Hours: 9am-9pm Insurance Accepted: All major insurance including Medicaid and Medicare [facility]: [phone] Hours: 9AM- 5PM Insurance Accepted: All insurances EXCEPT Medicaid and Medicare. [facility]: [phone]   ____________                                                                     Parenting Support Groups [facility] :  [address], [place], [place], [address] [phone] [facility]  Regional:  [phone] Family Support Network: (support for children in the NICU and/or with special needs), [phone]     _____________                                                                                  Online Resources Postpartum Support International: SeekAlumni.co.za  800-944-4PPD Supporting Moms:  www.momssupportingmoms.net

## 2024-04-28 NOTE — BH Specialist Note (Signed)
 Integrated Behavioral Health via Telemedicine Visit  04/28/2024 Barbara Moore [id number]  Number of Integrated Behavioral Health Clinician visits: 1- Initial Visit  Session Start time: 1316   Session End time: 1349  Total time in minutes: 33   Referring Provider: Nidia Daring, FNP Patient/Family location: Home [facility] Provider location: Center for [facility] at [facility]  All persons participating in visit: Patient Barbara Moore and [facility] Paull   Types of Service: Individual psychotherapy and Video visit  I connected with Barbara Moore and/or Barbara Moore's n/a via  Telephone or Video Enabled Telemedicine Application  (Video is Caregility application) and verified that I am speaking with the correct person using two identifiers. Discussed confidentiality: Yes   I discussed the limitations of telemedicine and the availability of in person appointments.  Discussed there is a possibility of technology failure and discussed alternative modes of communication if that failure occurs.  I discussed that engaging in this telemedicine visit, they consent to the provision of behavioral healthcare and the services will be billed under their insurance.  Patient and/or legal guardian expressed understanding and consented to Telemedicine visit: Yes   Presenting Concerns: Patient and/or family reports the following symptoms/concerns: Fatigue and worry; attributes to previous job loss after positive pregnancy; Pt is taking Effexor as prescribed; worries about taking safely in pregnancy. Pt has had mood improvement after starting a new position working from home with 4 wks paid maternity leave and availability of short-term disability if needed; good support at home. Pt wants to prevent blood transfusion as needed after first pregnancy with low iron.  Duration of problem: Current pregnancy/recent job loss; Severity of problem: moderate  Patient and/or Family's  Strengths/Protective Factors: Social connections, Social and Emotional competence, Concrete supports in place (healthy food, safe environments, etc.), Sense of purpose, and Physical Health (exercise, healthy diet, medication compliance, etc.)  Goals Addressed: Patient will:  Reduce symptoms of: anxiety, depression, and stress   Increase knowledge and/or ability of: healthy habits and stress reduction   Demonstrate ability to: Increase healthy adjustment to current life circumstances and Increase motivation to adhere to plan of care  Progress towards Goals: Ongoing    Interventions: Interventions utilized:  Psychoeducation and/or Health Education and Supportive Reflection Standardized Assessments completed: Not Needed    Patient and/or Family Response: Patient agrees with treatment plan.   Clinical Assessment/Diagnosis  Adjustment disorder with anxious mood .   Patient may benefit from psychoeducation and brief therapeutic interventions regarding coping with symptoms of depression, anxiety, life stress .  Plan: Follow up with behavioral health clinician on : Two months Behavioral recommendations:  -Continue taking prenatal vitamin and Effexor as prescribed/ discuss any changes at upcoming medical appointment -Consider increasing iron-rich foods (paired with vitamin-c foods) to help prevent low iron levels and fatigue -Continue prioritizing healthy self-care (regular meals, adequate rest; allowing practical help from supportive friends and family) -Read through information on After Visit Summary; use as needed and discussed  Referral(s): Integrated [employer] (In Clinic)  I discussed the assessment and treatment plan with the patient and/or parent/guardian. They were provided an opportunity to ask questions and all were answered. They agreed with the plan and demonstrated an understanding of the instructions.   They were advised to call back or seek an  in-person evaluation if the symptoms worsen or if the condition fails to improve as anticipated.  Loucile Posner C Shianne Zeiser, LCSW     03/30/2024    4:44 PM 02/19/2019  9:14 AM 10/28/2018    2:53 PM 10/23/2018   12:07 PM  Depression screen PHQ 2/9  Decreased Interest 1 1 0 0  Down, Depressed, Hopeless 2 0 0 0  PHQ - 2 Score 3 1 0 0  Altered sleeping 2 1 1  0  Tired, decreased energy 3 2 1 2   Change in appetite 2 1 0 2  Feeling bad or failure about yourself  2 0 0 0  Trouble concentrating 1 1 0 0  Moving slowly or fidgety/restless 0 0 0 0  Suicidal thoughts 0 0 0 0  PHQ-9 Score 13 6 2 4       03/30/2024    4:45 PM 02/19/2019    9:15 AM 10/28/2018    2:53 PM 10/23/2018   12:07 PM  GAD 7 : Generalized Anxiety Score  Nervous, Anxious, on Edge 2 1 0 0  Control/stop worrying 2 0 0 1  Worry too much - different things 3 0 0 1  Trouble relaxing 2 2 0 0  Restless 1 0 0 0  Easily annoyed or irritable 2 1 0 1  Afraid - awful might happen 3 0 0 0  Total GAD 7 Score 15 4 0 3  Anxiety Difficulty  Not difficult at all

## 2024-05-10 ENCOUNTER — Ambulatory Visit (INDEPENDENT_AMBULATORY_CARE_PROVIDER_SITE_OTHER): Admitting: Obstetrics and Gynecology

## 2024-05-10 VITALS — BP 123/80 | HR 106 | Wt 342.0 lb

## 2024-05-10 DIAGNOSIS — O0990 Supervision of high risk pregnancy, unspecified, unspecified trimester: Secondary | ICD-10-CM

## 2024-05-10 DIAGNOSIS — E890 Postprocedural hypothyroidism: Secondary | ICD-10-CM

## 2024-05-10 DIAGNOSIS — Z3A17 17 weeks gestation of pregnancy: Secondary | ICD-10-CM | POA: Diagnosis not present

## 2024-05-10 DIAGNOSIS — O0992 Supervision of high risk pregnancy, unspecified, second trimester: Secondary | ICD-10-CM | POA: Diagnosis not present

## 2024-05-10 DIAGNOSIS — D573 Sickle-cell trait: Secondary | ICD-10-CM

## 2024-05-10 DIAGNOSIS — F419 Anxiety disorder, unspecified: Secondary | ICD-10-CM

## 2024-05-10 DIAGNOSIS — B009 Herpesviral infection, unspecified: Secondary | ICD-10-CM

## 2024-05-10 DIAGNOSIS — Z6841 Body Mass Index (BMI) 40.0 and over, adult: Secondary | ICD-10-CM

## 2024-05-10 MED ORDER — PRENATAL PLUS VITAMIN/MINERAL 27-1 MG PO TABS: 1.0000 | ORAL_TABLET | Freq: Every day | ORAL | 11 refills | Status: DC

## 2024-05-10 NOTE — Progress Notes (Signed)
   PRENATAL VISIT NOTE  Subjective:  Barbara Moore is a 33 y.o. G2P1001 at [redacted]w[redacted]d being seen today for ongoing prenatal care.  She is currently monitored for the following issues for this high-risk pregnancy and has HSV-2 infection; BMI 50.0-59.9, adult (HCC); Sickle cell trait (HCC); Supervision of high risk pregnancy, antepartum; Hurthle cell carcinoma of thyroid  (HCC); Post-operative hypothyroidism; and Anxiety on their problem list.  Patient reports no complaints.  Contractions: Not present. Vag. Bleeding: None.  Movement: Present. Denies leaking of fluid.   The following portions of the patient's history were reviewed and updated as appropriate: allergies, current medications, past family history, past medical history, past social history, past surgical history and problem list.   Objective:   Vitals:   05/10/24 1645  BP: 123/80  Pulse: (!) 106  Weight: (!) 342 lb (155.1 kg)    Fetal Status: Fetal Heart Rate (bpm): 158   Movement: Present     General:  Alert, oriented and cooperative. Patient is in no acute distress.  Skin: Skin is warm and dry. No rash noted.   Cardiovascular: Normal heart rate noted  Respiratory: Normal respiratory effort, no problems with respiration noted  Abdomen: Soft, gravid, appropriate for gestational age.  Pain/Pressure: Absent      Assessment and Plan:  Pregnancy: G2P1001 at [redacted]w[redacted]d 1. [redacted] weeks gestation of pregnancy (Primary) 2. Supervision of high risk pregnancy, antepartum - AFP, Serum, Open Spina Bifida Anatomy scheduled  3. Post-operative hypothyroidism Last TSH 5/27 2.819 - unable to get labs today due to time of appointment  On synthroid 200mcg daily Next endo appt 8/19 - discussed if labs aren't drawn to let us  know so we can monitor TSH  4. HSV-2 infection Ppx at 36 weeks  5. BMI 50.0-59.9, adult (HCC)  6. Sickle cell trait (HCC) Not discussed today, likely needs partner testing (last pregnancy was different partner)  7.  Anxiety Discussed r/b of effexor including self limited neonatal adaptation syndrome and rare but serious risk of pHTN. Discussed this needs to be weighed against risk of untreated maternal mental health including risk of maternal death, risk of PTL/PTB, FGR, and issues with bonding postpartum Continue effexor F/w integrated beahvioral health  Please refer to After Visit Summary for other counseling recommendations.   Return in about 4 weeks (around 06/07/2024) for return OB at 21 weeks.  Future Appointments  Date Time Provider Department Center  06/01/2024 10:00 AM [facility] PROVIDER 1 [facility]  06/01/2024 10:30 AM [facility] US5 [facility]  06/08/2024  4:15 PM Nicholaus Burnard HERO, MD [facility]  06/30/2024  1:15 PM [facility] HEALTH CLINICIAN [facility]    Kieth JAYSON Carolin, MD

## 2024-05-12 ENCOUNTER — Ambulatory Visit: Payer: Self-pay | Admitting: Obstetrics and Gynecology

## 2024-05-12 DIAGNOSIS — O0990 Supervision of high risk pregnancy, unspecified, unspecified trimester: Secondary | ICD-10-CM

## 2024-05-12 LAB — AFP, SERUM, OPEN SPINA BIFIDA
AFP MoM: 1.11
AFP Value: 29.4 ng/mL
Gest. Age on Collection Date: 17 wk
Maternal Age At EDD: 32 a
OSBR Risk 1 IN: 10000
Test Results:: NEGATIVE
Weight: 342 [lb_av]

## 2024-06-01 ENCOUNTER — Other Ambulatory Visit: Payer: Self-pay | Admitting: *Deleted

## 2024-06-01 ENCOUNTER — Ambulatory Visit (HOSPITAL_BASED_OUTPATIENT_CLINIC_OR_DEPARTMENT_OTHER): Payer: Self-pay

## 2024-06-01 ENCOUNTER — Ambulatory Visit: Payer: Self-pay | Attending: Obstetrics and Gynecology | Admitting: Obstetrics and Gynecology

## 2024-06-01 VITALS — BP 121/69 | HR 102

## 2024-06-01 DIAGNOSIS — E669 Obesity, unspecified: Secondary | ICD-10-CM

## 2024-06-01 DIAGNOSIS — O99212 Obesity complicating pregnancy, second trimester: Secondary | ICD-10-CM | POA: Insufficient documentation

## 2024-06-01 DIAGNOSIS — O09292 Supervision of pregnancy with other poor reproductive or obstetric history, second trimester: Secondary | ICD-10-CM

## 2024-06-01 DIAGNOSIS — O99012 Anemia complicating pregnancy, second trimester: Secondary | ICD-10-CM | POA: Diagnosis not present

## 2024-06-01 DIAGNOSIS — O99282 Endocrine, nutritional and metabolic diseases complicating pregnancy, second trimester: Secondary | ICD-10-CM

## 2024-06-01 DIAGNOSIS — O0991 Supervision of high risk pregnancy, unspecified, first trimester: Secondary | ICD-10-CM

## 2024-06-01 DIAGNOSIS — Z363 Encounter for antenatal screening for malformations: Secondary | ICD-10-CM | POA: Diagnosis not present

## 2024-06-01 DIAGNOSIS — Z3A20 20 weeks gestation of pregnancy: Secondary | ICD-10-CM | POA: Insufficient documentation

## 2024-06-01 DIAGNOSIS — O0990 Supervision of high risk pregnancy, unspecified, unspecified trimester: Secondary | ICD-10-CM

## 2024-06-01 DIAGNOSIS — E039 Hypothyroidism, unspecified: Secondary | ICD-10-CM | POA: Insufficient documentation

## 2024-06-01 DIAGNOSIS — D573 Sickle-cell trait: Secondary | ICD-10-CM | POA: Diagnosis not present

## 2024-06-01 DIAGNOSIS — O0992 Supervision of high risk pregnancy, unspecified, second trimester: Secondary | ICD-10-CM | POA: Insufficient documentation

## 2024-06-01 DIAGNOSIS — Z85850 Personal history of malignant neoplasm of thyroid: Secondary | ICD-10-CM | POA: Insufficient documentation

## 2024-06-01 DIAGNOSIS — O09299 Supervision of pregnancy with other poor reproductive or obstetric history, unspecified trimester: Secondary | ICD-10-CM

## 2024-06-01 DIAGNOSIS — E66813 Obesity, class 3: Secondary | ICD-10-CM

## 2024-06-01 NOTE — Progress Notes (Signed)
 Maternal-Fetal Medicine Consultation Name: Barbara Moore MRN: [id number]  G2 P1001 at 20w 1d gestation. Patient is here for fetal anatomy scan. On cell-free fetal DNA screening, the risks of aneuploidies are not increased. MSAFP screening showed low risk for open-neural tube defects. Her high-risk problems include: -History of thyroid  cancer.  In July 2023, patient had total thyroidectomy.  Pathology showed Hurthle cell carcinoma.  She completed radioiodine treatment after surgery and has had no recurrence.  She has hypothyroidism and takes levothyroxine 200 micrograms daily.  Recent TSH levels are within normal range. -Pregravid BMI 55. - Sickle cell trait (hemoglobin C trait; from 10/23/2018 test result). - Obstetric history: Her previous pregnancy was complicated preeclampsia with severe features.  In July 2020, she had a term vaginal delivery at [redacted] weeks gestation of a female infant weighing 7 pounds and 14 ounces at birth. Ultrasound We performed fetal anatomy scan. No makers of aneuploidies or fetal structural defects are seen.  It anatomical survey could not be completed because of fetal position.  Fetal biometry is consistent with her previously-established dates. Amniotic fluid is normal and good fetal activity is seen. Patient understands the limitations of ultrasound in detecting fetal anomalies.   Our concerns include: History of thyroid  cancer I reassured the patient that her pregnancy does not increase recurrence of cancer.  She had total thyroidectomy, and no invasive tumor was detected.  Patient with had completed radioiodine treatment.  Generally, it is recommended that patient wait for at least 6 months after completion of radioiodine treatment before conceiving. I encouraged her to continue levothyroxine supplements to prevent hypothyroidism.  Pregravid BMI  Grade 3 obesity is independently associated with increased risk of stillbirth (2.5- to 3-fold), but the absolute risk is very  small.  It is associated with increased incidence of gestational diabetes and hypertension. I discussed protocol of weekly antenatal testing from [redacted] weeks gestation until delivery. Ultrasound has limitations in the resolution of ultrasound images and fetal anomalies may be missed.  Weight loss is not advised in pregnancy and a weight gain of 11 to 20 pounds is reasonable.  Sickle-Cell trait (HbC trait) I discussed genetics and encouraged partner screening.  If partner is not available, patient has an option to have UNITY screening to determine fetal sickle cell status.  Alternatively, amniocentesis is an option.  I explained the procedure and possible complication of miscarriage (1 in 500 procedure).  I encouraged her to meet with our genetic counselor.  Patient made an appointment for a virtual visit with our genetic counselor.  History of preeclampsia Because of increased risk of recurrence of preeclampsia, I encouraged her to continue low-dose aspirin  prophylaxis that delays for prevention of preeclampsia.  She has a different partner and that may modify her risk.  Recommendations - Appointment was made for her to return in 4 weeks for completion of fetal anatomy. - Fetal growth assessments every 4 weeks till delivery. - Weekly antenatal testing from [redacted] weeks gestation until delivery. - Genetic counseling in 2 weeks.   Consultation including face-to-face (more than 50%) counseling 45 minutes.

## 2024-06-03 ENCOUNTER — Ambulatory Visit: Attending: Obstetrics and Gynecology

## 2024-06-03 DIAGNOSIS — D582 Other hemoglobinopathies: Secondary | ICD-10-CM

## 2024-06-03 DIAGNOSIS — O99012 Anemia complicating pregnancy, second trimester: Secondary | ICD-10-CM | POA: Diagnosis not present

## 2024-06-03 DIAGNOSIS — Z3A20 20 weeks gestation of pregnancy: Secondary | ICD-10-CM | POA: Diagnosis not present

## 2024-06-03 NOTE — Progress Notes (Signed)
 [facility] for Maternal Fetal Care at [facility] for Women [address] Phone:  [phone]   Fax:  [phone]      Virtual Visit via Video Note  I connected with  [name] on 06/03/24 at  2:30 PM EDT by a video enabled telemedicine application and verified that I am speaking with the correct person using two identifiers.  Location: Patient: Home. Provider: Maternal Fetal Care.   Genetic Counseling Clinic Note:   I spoke with 33 y.o. Barbara Moore today to discuss her carrier screening results. She was referred by [name], FNP.   Pregnancy History:    G2P1001. EGA: [redacted]w[redacted]d by LMP. EDD: 10/18/2024. Barbara Moore has one healthy son. Personal history of thyroid  cancer s/p thyroidectomy. Denies other major personal health concern. Reports bleeding at 14w and was evaluated by her OB. Denies other bleeding, infections, and fevers in this pregnancy. Denies using tobacco, alcohol, or street drugs in this pregnancy.   Family History:    A three-generation pedigree was created and scanned into Epic under the Media tab.  Patient has a personal history of thyroid  cancer. Different types of cancer can have a hereditary component that increases an individual's susceptibility to develop cancer; however, most cancers occur by chance due to a combination of genetics and the environment. If there is concern for a genetic cause, Barbara Moore's oncologist can refer her to cancer genetics.   Patient reports her sister has a mild arrhyhtmia that does not require treatment. She reports her 34 yo niece has neurofibromatosis type 1 (NF1). She has cafe-au-lait macules, dyslexia, and ADHD. Family history is otherwise negative for NF1 symptoms. No known genetic testing was performed on her niece or niece's parents. We reviewed this may have been inherited (autosomal dominant) or may have occurred de novo. As Barbara Moore does not have signs of NF1, risk to the pregnancy is not expected to  be high. However, recurrence risk is difficult to assess without known genetic test reports. [name] is encouraged to reach out with any medical or test reports for review.  Patient reports her 86 yo son was diagnosed with autism at 63 yo. No genetic testing performed. He is healthy. We discussed that we are unable to directly test for autism in a pregnancy. Genetic testing for individuals with a clinical diagnosis of autism yields an explanation in only about 20% of cases, and the remaining 80% of cases are left with unknown etiology. Having a previously affected child may increase the chance that Barbara Moore's other children will have autism; however, without genetic testing performed on affected family members, it is difficult to assess risk to the pregnancy and other family members. The risk may be up to 50% in the case of an identified genetic cause. We also discussed and offered screening for fragile X syndrome, which is one of the most common causes of inherited intellectual disability and autism in males. Fragile X syndrome affects females as well. Lanna declined fragile X carrier screening.  Paternal personal and family history is limited.  Maternal ethnicity reported as Black and paternal ethnicity reported as Black. Denies Ashkenazi Jewish ancestry.  Family history not remarkable for consanguinity, individuals with birth defects, intellectual disability, autism spectrum disorder, multiple spontaneous abortions, still births, or unexplained neonatal death.   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 Therefore, she has both the  usual type of hemoglobin A and the altered type, hemoglobin C (Hb A/C).   We reviewed beta globin genes, hemoglobin, forms of beta-hemoglobinopathies and their natural histories, and the autosomal mode of inheritance. Caris will  either pass down either the normal beta globin gene that produces the normal hemoglobin A OR the altered beta globin gene that produces hemoglobin C. There would be a 25% risk for the pregnancy to be affected with hemoglobin C disease (HbC/C) if [place]'s reproductive partner is also a carrier. There are several types of beta-hemoglobinopathies, including hemoglobin S, hemoglobin D, hemoglobin E, and beta-thalassemia. If Barbara Moore's reproductive partner is found to be a carrier for for a beta-hemoglobinopathy, there would be a 25% chance that this pregnancy would be affected. The type of beta-hemoglobinopathy and symptoms will vary depending on the genotype.   Given Barbara Moore's hemoglobin fractionation cascade results, we discussed and offered carrier screening for her reproductive partner. We reviewed the benefits and limitations of carrier screening and that it can detect most but not all carriers.  She declined at this time, as he is not available for testing. We discussed other options, including prenatal diagnosis through amniocentesis, UNITY single gene NIPS, and postnatal testing. The benefits, risks, and limitations of each were reviewed. We reviewed that the [place]  Newborn Screening (NBS) program will screen all newborn babies for cystic fibrosis, spinal muscular atrophy, hemoglobinopathies, and numerous other conditions. Barbara Moore declined UNITY single gene NIPS and amniocentesis. She elected to wait for the NBS results.   In the meantime, we calculated the chance that this pregnancy would be affected with a beta-hemoglobinopathy. Given Barbara Moore's hemoglobin fractionation cascade results and Barbara Moore's ethnicity, there is a 1 in 40 (~2.5%) chance that the pregnancy would be affected.  Of note, she was not found to be a carrier for CF, SMA, or alpha thalassemia which significantly reduces but does not eliminate the chance of being a carrier. Please see report for details and residual risk  information.  Newborn Screening. The [place]  Newborn Screening (NBS) program will screen all newborn babies for cystic fibrosis, spinal muscular atrophy, hemoglobinopathies, and numerous other conditions.  Previous Testing Completed:  Low risk NIPS: Monchel previously completed noninvasive prenatal screening (NIPS) in this pregnancy. The result is low risk, consistent with a female fetus. This screening significantly reduces but does not eliminate the chance that the current pregnancy has Down syndrome (trisomy 69), trisomy 19, trisomy 44, and common sex chromosome conditions. Please see report for details. There are many genetic conditions that cannot be detected by NIPS.   Negative ms-AFP screening: Shamarra previously completed a maternal serum AFP screen in this pregnancy. The result is screen negative. Please see report for details. A negative result reduces the risk that the current pregnancy has an open neural tube defect. Closed neural tube defects and some open defects may not be detected by this screen.   Plan of Care:   Declined partner carrier screening, single gene NIPS, or amniocentesis. Newborn screening of the newborn for hemoglobinopathies. Routine prenatal care. Follow-up ultrasound at [facility] scheduled for 07/06/2024.   Informed consent was obtained. All questions were answered.   60 minutes were spent on the date of the encounter in service to the patient including preparation, consultation through video chat, discussion of test reports and available next steps, pedigree construction, genetic risk assessment, documentation, and care coordination.    Thank you for sharing in the care of Barbara Moore with us .  Please do not hesitate to contact us  at [phone]  if you have any questions.   Lauraine Bodily, MS, [facility] Certified Genetic Counselor   Genetic counseling student involved in appointment: Yes [place] LOISE ROUGE).

## 2024-06-08 ENCOUNTER — Encounter: Payer: Self-pay | Admitting: Obstetrics and Gynecology

## 2024-06-08 ENCOUNTER — Ambulatory Visit: Admitting: Obstetrics and Gynecology

## 2024-06-08 ENCOUNTER — Other Ambulatory Visit: Payer: Self-pay

## 2024-06-08 VITALS — BP 124/75 | HR 112 | Wt 343.9 lb

## 2024-06-08 DIAGNOSIS — E890 Postprocedural hypothyroidism: Secondary | ICD-10-CM

## 2024-06-08 DIAGNOSIS — Z3A21 21 weeks gestation of pregnancy: Secondary | ICD-10-CM

## 2024-06-08 DIAGNOSIS — Z6841 Body Mass Index (BMI) 40.0 and over, adult: Secondary | ICD-10-CM

## 2024-06-08 DIAGNOSIS — O0992 Supervision of high risk pregnancy, unspecified, second trimester: Secondary | ICD-10-CM

## 2024-06-08 DIAGNOSIS — B009 Herpesviral infection, unspecified: Secondary | ICD-10-CM

## 2024-06-08 DIAGNOSIS — D582 Other hemoglobinopathies: Secondary | ICD-10-CM

## 2024-06-08 DIAGNOSIS — C73 Malignant neoplasm of thyroid gland: Secondary | ICD-10-CM | POA: Diagnosis not present

## 2024-06-08 DIAGNOSIS — Z3009 Encounter for other general counseling and advice on contraception: Secondary | ICD-10-CM

## 2024-06-08 DIAGNOSIS — F419 Anxiety disorder, unspecified: Secondary | ICD-10-CM

## 2024-06-08 DIAGNOSIS — O0990 Supervision of high risk pregnancy, unspecified, unspecified trimester: Secondary | ICD-10-CM

## 2024-06-08 NOTE — Progress Notes (Signed)
   PRENATAL VISIT NOTE  Subjective:  Barbara Moore is a 33 y.o. G2P1001 at [redacted]w[redacted]d being seen today for ongoing prenatal care.  She is currently monitored for the following issues for this high-risk pregnancy and has HSV-2 infection; BMI 50.0-59.9, adult (HCC); Hemoglobin C trait (HCC); Supervision of high risk pregnancy, antepartum; Hurthle cell carcinoma of thyroid  (HCC); Post-operative hypothyroidism; Anxiety; and Unwanted fertility on their problem list.  Patient reports feeling occasionally light-headed.  Contractions: Not present. Vag. Bleeding: None.  Movement: Present. Denies leaking of fluid.   The following portions of the patient's history were reviewed and updated as appropriate: allergies, current medications, past family history, past medical history, past social history, past surgical history and problem list.   Objective:    Vitals:   06/08/24 1704  BP: 124/75  Pulse: (!) 112  Weight: (!) 343 lb 14.4 oz (156 kg)    Fetal Status:  Fetal Heart Rate (bpm): 161   Movement: Present    General: Alert, oriented and cooperative. Patient is in no acute distress.  Skin: Skin is warm and dry. No rash noted.   Cardiovascular: Normal heart rate noted  Respiratory: Normal respiratory effort, no problems with respiration noted  Abdomen: Soft, gravid, appropriate for gestational age.  Pain/Pressure: Present     Pelvic: Cervical exam deferred        Extremities: Normal range of motion.  Edema: Trace  Mental Status: Normal mood and affect. Normal behavior. Normal judgment and thought content.   Assessment and Plan:  Pregnancy: G2P1001 at [redacted]w[redacted]d  1. Supervision of high risk pregnancy, antepartum (Primary) - TSH - T4, free Cont synthroid  200 mcg daily  2. Hurthle cell carcinoma of thyroid  (HCC) S/p surgery  3. Post-operative hypothyroidism Cont synthroid  TSH/T4 today  4. Hemoglobin C trait (HCC)  5. BMI 50.0-59.9, adult (HCC) Cont baby aspirin  Weekly testing start 34  weeks  6. HSV-2 infection  7. [redacted] weeks gestation of pregnancy  8. Unwanted fertility Needs to sign BTL papers Will be interval, pt understands and agreeable  9. Anxiety Doing well, feels stable on venlafaxine  Preterm labor symptoms and general obstetric precautions including but not limited to vaginal bleeding, contractions, leaking of fluid and fetal movement were reviewed in detail with the patient. Please refer to After Visit Summary for other counseling recommendations.   Return in about 1 month (around 07/08/2024) for high OB.  Future Appointments  Date Time Provider Department Center  06/30/2024  1:15 PM [facility] HEALTH CLINICIAN [facility]  07/06/2024 11:15 AM [facility] PROVIDER 1 [facility]  07/06/2024 11:30 AM [facility] US4 [facility]  07/06/2024  3:55 PM Zina Jerilynn LABOR, MD [facility]  07/27/2024 10:55 AM Tamon Parkerson, Devon E, PA-C [facility]  08/10/2024 10:55 AM [facility] 1 [facility]  08/24/2024  3:15 PM [facility]AL 1 [facility]  09/07/2024  3:55 PM [facility]AL 1 [facility]  09/21/2024  3:55 PM [facility]AL 1 [facility]  09/28/2024  3:55 PM [facility] 1 [facility]  10/05/2024  3:55 PM [facility]GENERAL 1 [facility]  10/12/2024  3:55 PM [facility]GENERAL 1 [facility]  10/19/2024  3:55 PM [facility]GENERAL 1 [facility]    Burnard CHRISTELLA Moats, MD

## 2024-06-09 LAB — TSH: TSH: 4.47 u[IU]/mL (ref 0.450–4.500)

## 2024-06-11 ENCOUNTER — Ambulatory Visit: Payer: Self-pay | Admitting: Obstetrics and Gynecology

## 2024-06-11 MED ORDER — LEVOTHYROXINE SODIUM 25 MCG PO TABS: 25.0000 ug | ORAL_TABLET | Freq: Every day | ORAL | 2 refills | Status: DC

## 2024-06-30 ENCOUNTER — Ambulatory Visit: Admitting: Clinical

## 2024-06-30 DIAGNOSIS — F4322 Adjustment disorder with anxiety: Secondary | ICD-10-CM

## 2024-06-30 NOTE — BH Specialist Note (Signed)
 Integrated Behavioral Health via Telemedicine Visit  06/30/2024 Barbara Moore [id number]  Number of Integrated Behavioral Health Clinician visits: 1- Initial Visit  Session Start time: 1316   Session End time: 1349  Total time in minutes: 33  Referring Provider: Nidia Daring, FNP Barbara/Family location: Home  [facility] Provider location: Center for [facility] at [facility]  All persons participating in visit: Barbara Moore and Barbara Moore   Types of Service: Individual psychotherapy and Video visit  I connected with Barbara Moore and/or Barbara Moore's n/a via  Telephone or Video Enabled Telemedicine Application  (Video is Caregility application) and verified that I am speaking with the correct person using two identifiers. Discussed confidentiality: Yes   I discussed the limitations of telemedicine and the availability of in person appointments.  Discussed there is a possibility of technology failure and discussed alternative modes of communication if that failure occurs.  I discussed that engaging in this telemedicine visit, they consent to the provision of behavioral healthcare and the services will be billed under their insurance.  Barbara and/or legal guardian expressed understanding and consented to Telemedicine visit: Yes   Presenting Concerns: Barbara and/or family reports the following symptoms/concerns: Daily fatigue, adjusting to normal stress of managing pregnancy and work from home position; depressive/anxious symptoms decreased to none; no additional questions or concerns at this time.   Barbara and/or Family's Strengths/Protective Factors: Social connections, Social and Patent attorney, Concrete supports in place (healthy food, safe environments, etc.), Sense of purpose, and Physical Health (exercise, healthy diet, medication compliance, etc.)  Goals Addressed: Barbara will:  Maintain reduced symptoms of: anxiety and  depression    Progress towards Goals: Achieved    Interventions: Interventions utilized:  Supportive Reflection Standardized Assessments completed: GAD-7 and PHQ 9    Barbara and/or Family Response: Barbara agrees with treatment plan.   Clinical Assessment/Diagnosis  No diagnosis found. .   Barbara may benefit from continued therapeutic intervention .  Plan: Follow up with behavioral health clinician on : Call Leven Hoel at [phone], as needed. Behavioral recommendations:  -Continue taking prenatal vitamin and Effexor as prescribed -Continue prioritizing healthy self-care (regular meals, adequate rest; allowing practical help from supportive friends and family)  -Read through information on After Visit Summary; use as needed and discussed  Referral(s): Integrated [employer] (In Clinic)  I discussed the assessment and treatment plan with the Barbara and/or parent/guardian. They were provided an opportunity to ask questions and all were answered. They agreed with the plan and demonstrated an understanding of the instructions.   They were advised to call back or seek an in-person evaluation if the symptoms worsen or if the condition fails to improve as anticipated.  Warren BROCKS Dimond Crotty, LCSW     06/30/2024    1:32 PM 03/30/2024    4:44 PM 02/19/2019    9:14 AM 10/28/2018    2:53 PM 10/23/2018   12:07 PM  Depression screen PHQ 2/9  Decreased Interest 0 1 1 0 0  Down, Depressed, Hopeless 0 2 0 0 0  PHQ - 2 Score 0 3 1 0 0  Altered sleeping 0 2 1 1  0  Tired, decreased energy 3 3 2 1 2   Change in appetite 0 2 1 0 2  Feeling bad or failure about yourself  0 2 0 0 0  Trouble concentrating 0 1 1 0 0  Moving slowly or fidgety/restless 0 0 0 0 0  Suicidal thoughts 0 0 0 0 0  PHQ-9 Score 3 13 6 2 4       06/30/2024    1:33 PM 03/30/2024    4:45 PM 02/19/2019    9:15 AM 10/28/2018    2:53 PM  GAD 7 : Generalized Anxiety Score  Nervous, Anxious, on Edge 0 2 1 0   Control/stop worrying 0 2 0 0  Worry too much - different things 0 3 0 0  Trouble relaxing 0 2 2 0  Restless 0 1 0 0  Easily annoyed or irritable 0 2 1 0  Afraid - awful might happen 0 3 0 0  Total GAD 7 Score 0 15 4 0  Anxiety Difficulty   Not difficult at all

## 2024-07-01 LAB — SPECIMEN STATUS REPORT

## 2024-07-01 LAB — T4, FREE: Free T4: 1.11 ng/dL (ref 0.82–1.77)

## 2024-07-06 ENCOUNTER — Telehealth (INDEPENDENT_AMBULATORY_CARE_PROVIDER_SITE_OTHER): Admitting: Obstetrics and Gynecology

## 2024-07-06 ENCOUNTER — Ambulatory Visit

## 2024-07-06 ENCOUNTER — Other Ambulatory Visit: Payer: Self-pay | Admitting: *Deleted

## 2024-07-06 ENCOUNTER — Ambulatory Visit: Attending: Obstetrics and Gynecology | Admitting: Obstetrics and Gynecology

## 2024-07-06 VITALS — BP 128/66

## 2024-07-06 DIAGNOSIS — O0990 Supervision of high risk pregnancy, unspecified, unspecified trimester: Secondary | ICD-10-CM

## 2024-07-06 DIAGNOSIS — Z3009 Encounter for other general counseling and advice on contraception: Secondary | ICD-10-CM

## 2024-07-06 DIAGNOSIS — O99282 Endocrine, nutritional and metabolic diseases complicating pregnancy, second trimester: Secondary | ICD-10-CM | POA: Insufficient documentation

## 2024-07-06 DIAGNOSIS — O99212 Obesity complicating pregnancy, second trimester: Secondary | ICD-10-CM | POA: Insufficient documentation

## 2024-07-06 DIAGNOSIS — O09292 Supervision of pregnancy with other poor reproductive or obstetric history, second trimester: Secondary | ICD-10-CM

## 2024-07-06 DIAGNOSIS — O99012 Anemia complicating pregnancy, second trimester: Secondary | ICD-10-CM

## 2024-07-06 DIAGNOSIS — Z3A25 25 weeks gestation of pregnancy: Secondary | ICD-10-CM | POA: Diagnosis not present

## 2024-07-06 DIAGNOSIS — O0992 Supervision of high risk pregnancy, unspecified, second trimester: Secondary | ICD-10-CM

## 2024-07-06 DIAGNOSIS — E039 Hypothyroidism, unspecified: Secondary | ICD-10-CM | POA: Diagnosis not present

## 2024-07-06 DIAGNOSIS — E890 Postprocedural hypothyroidism: Secondary | ICD-10-CM | POA: Diagnosis not present

## 2024-07-06 DIAGNOSIS — D582 Other hemoglobinopathies: Secondary | ICD-10-CM

## 2024-07-06 DIAGNOSIS — O98812 Other maternal infectious and parasitic diseases complicating pregnancy, second trimester: Secondary | ICD-10-CM | POA: Diagnosis not present

## 2024-07-06 DIAGNOSIS — B009 Herpesviral infection, unspecified: Secondary | ICD-10-CM

## 2024-07-06 DIAGNOSIS — Z6841 Body Mass Index (BMI) 40.0 and over, adult: Secondary | ICD-10-CM

## 2024-07-06 DIAGNOSIS — E6689 Other obesity not elsewhere classified: Secondary | ICD-10-CM | POA: Diagnosis not present

## 2024-07-06 DIAGNOSIS — O09299 Supervision of pregnancy with other poor reproductive or obstetric history, unspecified trimester: Secondary | ICD-10-CM

## 2024-07-06 DIAGNOSIS — Z862 Personal history of diseases of the blood and blood-forming organs and certain disorders involving the immune mechanism: Secondary | ICD-10-CM | POA: Insufficient documentation

## 2024-07-06 DIAGNOSIS — Z362 Encounter for other antenatal screening follow-up: Secondary | ICD-10-CM | POA: Diagnosis present

## 2024-07-06 NOTE — Progress Notes (Signed)
 OBSTETRICS PRENATAL VIRTUAL VISIT ENCOUNTER NOTE  Provider location: Center for [facility]   Patient location: Home  I connected with Barbara Moore on 07/06/24 at  3:55 PM EDT by MyChart Video Encounter and verified that I am speaking with the correct person using two identifiers. I discussed the limitations, risks, security and privacy concerns of performing an evaluation and management service virtually and the availability of in person appointments. I also discussed with the patient that there may be a patient responsible charge related to this service. The patient expressed understanding and agreed to proceed. Subjective:  Barbara Moore is a 33 y.o. G2P1001 at [redacted]w[redacted]d being seen today for ongoing prenatal care.  She is currently monitored for the following issues for this high-risk pregnancy and has HSV-2 infection; BMI 50.0-59.9, adult (HCC); Hemoglobin C trait; Supervision of high risk pregnancy, antepartum; Hurthle cell carcinoma of thyroid  (HCC); Post-operative hypothyroidism; Anxiety; and Unwanted fertility on their problem list.  Pt was seen by endocrinology this morning and they increased her synthroid  to 250 mcg daily, (125 mcg x 2)  Patient reports no complaints.  Contractions: Not present. Vag. Bleeding: None.  Movement: Present. Denies any leaking of fluid.   The following portions of the patient's history were reviewed and updated as appropriate: allergies, current medications, past family history, past medical history, past social history, past surgical history and problem list.   Objective:    Vitals:   07/06/24 1616  BP: 128/66    Fetal Status:      Movement: Present    General: Alert, oriented and cooperative. Patient is in no acute distress.  Respiratory: Normal respiratory effort, no problems with respiration noted  Mental Status: Normal mood and affect. Normal behavior. Normal judgment and thought content.  Rest of physical exam  deferred due to type of encounter  Imaging: No results found.  Assessment and Plan:  Pregnancy: G2P1001 at [redacted]w[redacted]d 1. [redacted] weeks gestation of pregnancy (Primary)  2. Post-operative hypothyroidism See endocrinology note for specifics, but synthroid  has been increased  3. Unwanted fertility Sign BTL form at next in person visit  4. Supervision of high risk pregnancy, antepartum Continue routine prenatal care  5. HSV-2 infection Prophylaxis at 36 weeks  6. BMI 50.0-59.9, adult (HCC)   Preterm labor symptoms and general obstetric precautions including but not limited to vaginal bleeding, contractions, leaking of fluid and fetal movement were reviewed in detail with the patient. I discussed the assessment and treatment plan with the patient. The patient was provided an opportunity to ask questions and all were answered. The patient agreed with the plan and demonstrated an understanding of the instructions. The patient was advised to call back or seek an in-person office evaluation/go to [facility] at [facility] for any urgent or concerning symptoms. Please refer to After Visit Summary for other counseling recommendations.   I provided 10 minutes of face-to-face time during this encounter.  Return in about 2 weeks (around 07/20/2024) for ROB, in person, 2 hr GTT, 3rd trim labs.  Future Appointments  Date Time Provider Department Center  07/27/2024 10:55 AM Nicholaus Jorene FORBES DEVONNA [facility]  08/03/2024  3:30 PM [facility] PROVIDER 1 [facility]  08/03/2024  3:45 PM [facility] US4 [facility]  08/10/2024 10:55 AM Cleatus Moccasin, MD [facility]  08/24/2024  1:55 PM Davis, Devon E, PA-C [facility]  09/07/2024  2:00 PM [facility] PROVIDER 1 [facility]  09/07/2024  2:15 PM [facility] US5 [facility]  09/07/2024  3:55 PM [facility]AL 1 [facility]  09/21/2024  3:55 PM [facility] 1 [facility]  09/28/2024  3:55 PM [facility] 1 [facility]  10/05/2024  3:55 PM [facility] 1 [facility]   10/12/2024  3:55 PM [facility] 1 [facility]  10/19/2024  3:55 PM [facility] 1 [facility]    Jerilynn DELENA Buddle, MD Center for [employer], [facility]

## 2024-07-06 NOTE — Progress Notes (Signed)
 Maternal-Fetal Medicine Consultation  Name: Barbara Moore  MRN: [id number]  GA: [id number] [redacted]w[redacted]d   Patient is here for completion of fetal anatomy. Pregravid BMI 55. Ultrasound The estimated fetal weight and the abdominal circumference measurements are at the 99th percentiles.  Normal amniotic fluid.  Fetal anatomical survey was completed (except LVOT and ductal arch) and appears normal.  I discussed the limitations of ultrasound and accurately estimating fetal weights.  Obesity and gestational diabetes can lead to large for gestational age fetus.  I encouraged her to screen for gestational diabetes. Gestational diabetes occurs in 8% to 10% of pregnant women and is more common in women with grade 3 obesity.  Recommendations - Appointment was made for her to return in 5 weeks for fetal growth assessment.     Consultation including face-to-face (more than 50%) counseling 10 minutes.

## 2024-07-06 NOTE — Progress Notes (Signed)
 [facility] Adult Endocrinology Clinic Note   HPI:  This is a 33 y.o. female who presents for folow up for Hurthle cell/oncocytic thyroid  carcinoma   Synopsis: Patient was initially noted to have thyroid  nodules with PCP in May 2023.  Thyroid  ultrasound demonstrated 2 dominant nodules meeting criteria for biopsy.  Fine-needle aspiration biopsy was performed on 02/25/2022 with results consistent with Hurthle cell neoplasm in the left nodule and FLUS in the right nodule.  She was subsequently referred to ENT for total thyroidectomy and is status post total thyroidectomy on 04/12/2022.  No complications reported postoperatively.   Summary of Therapy: Total thyroidectomy on 04/12/2022, RAI 20.3 mCi I-131 was administered via capsule on 12/19/2022. (Completed radioactive iodine administration for remnant thyroid  ablation, given residual 9.6% uptake within the neck on contemporaneous iodine imaging. The patient will return in 6 months for a 100 mCi post-surgical I-131 ablation for potential microscopic residual tumor, pending thyroglobulin levels)   Pathology: Oncocytic carcinoma located in the left lobe with tumor size of 5.5 in greatest dimension with capsular invasion identified angioinvasion identified focally without any lymphatic invasion or extrathyroidal extension.  Surgical margins were negative for tumor.  Right lobe pathology was consistent with follicular adenoma 3 cm in greatest dimension  Current LT4 dose: 200 mcg daily   LV 09/2022 to establish with endocrinology   LV 02/2024  Patient was consented for utilization of ambient recorder DAX Copilot tool for preparation of note for today's encounter. Note reviewed and edited by provider before finalizing.   Interval History 07/06/2024: History of Present Illness The patient is a 33 year old female who presents for follow-up for Hurthle cell/oncocytic thyroid  carcinoma. She was last seen in 02/2024, at which time her thyroid  function tests and monitoring  ultrasound were repeated. At that visit, she was [redacted] weeks pregnant. Her thyroid  function tests revealed a TSH of 2.8, Free T4 of 0.8, and a thyroglobulin level of 0.4, which was negative for recurrence. The ultrasound of the thyroid  was also negative for recurrence, and she was noted to have reactive lymph nodes in 04/2024. She is currently on levothyroxine  200 mcg daily.  She reports feeling well overall, with no significant weight gain during her current pregnancy. She takes her levothyroxine  in the morning, at least an hour before eating. She has not yet received the additional 25 mcg dose that was prescribed. She mentions experiencing dry skin on her face, which she manages by washing and moisturizing it regularly.  She is currently halfway through her pregnancy and is experiencing fatigue, which she is unsure if it is due to her pregnancy or her thyroid  condition. Her due date is 10/18/2024, and she plans to have a vaginal delivery. She has been classified as high risk due to a history of preeclampsia in her first pregnancy.  Pt had RAI treatment in March 2024, which was successful. However, she was informed that not all of her thyroid  was removed as initially thought, causing some concern. The nuclear medicine team found residual tissue on the right side, prompting a discussion with Dr. Burnie about the possibility of further surgery. Due to the risks associated with another surgery, they decided to proceed with additional RAI treatment - currently planned for May 2025.   She has been consistent with her thyroid  medication, taking 200 mcg daily, and has not missed any doses.   Denies change in neck, lymph node or dysphonia   Review of Systems:  Pertinent positives noted in the HPI  PMH, PSH, Social Hx, Family  Hx, Meds and Allergies   I have independently reviewed the patient's past medical history, past surgical history, medication list, and social history noted below.   Past Surgical  History:  Procedure Laterality Date  . THYROIDECTOMY Bilateral 04/12/2022   Procedure: THYROIDECTOMY W/ NERVE MONITORING;  Surgeon: Lamar Tamar Burnie PONCE, MD;  Location: [facility] MAIN OR;  Service: ENT;  Laterality: Bilateral;   Social History  Works as Advertising account planner. Has son named Valery. Single. Tobacco Use  . Smoking status: Never  . Smokeless tobacco: Never  Vaping Use  . Vaping Use: Never used  Substance Use Topics  . Alcohol use: Not Currently  . Drug use: Not Currently   Medications: Current Outpatient Medications  Medication Instructions  . albuterol HFA (PROVENTIL HFA;VENTOLIN HFA;PROAIR HFA) 90 mcg/actuation inhaler 2 puffs, Every 6 hours PRN  . glucose blood (Accu-Chek Guide test strips) test strip Use as instructed  . Lancets misc Use as directed.  . levothyroxine  (SYNTHROID ) 125 mcg tablet Take 250 mcg (2 tablets) once daily  . multivitamin,tx-minerals (Vitamins and Minerals) tab Take  by mouth.  . triamcinolone  acetonide (KENALOG ) 0.1 % cream topical, 2 times daily  . venlafaxine (EFFEXOR XR) 150 mg, oral, Daily   Allergies: Allergies  Allergen Reactions  . Lactose Intolerance (Lactase) GI Intolerance, Diarrhea and Nausea And Vomiting    n/a   Objective Data   BP 118/72 (BP Location: Left arm, Patient Position: Sitting)   Pulse 79   Ht 1.702 m (5' 7)   Wt (!) 147 kg (324 lb 9.6 oz)   SpO2 98%   BMI 50.84 kg/m   Physical Exam: General: Alert, pleasant, well-nourished, no acute distress HEENT:  NCAT, EOMI, vision grossly intact, no LAD  Thyroid : Anterior neck scar present, no lymphadenopathy   CV: RRR, normal S1, S2, with no murmurs, rubs or gallops Resp: Normal respiratory effort, lungs clear to auscultation bilaterally Neuro: CN2-12 grossly intact, answers questions appropriately  Pysch: Pleasant mood, appropriate affect Extremities: Lower extremities warm, well perfused, no edema noted Skin: no rashes or wounds noted  Pertinent  Labs: Component     Latest Ref Rng & Units 12/28/2021 05/13/2022 06/24/2022  TSH     0.45 - 5.00 UIU/ML 0.73 0.02 (L) 0.35 (L)  Free T4     0.6 - 1.3 NG/DL 0.8 1.5 (H) 0.7  FREE T3     2.30 - 4.20 pg/mL 3.12     Component     Latest Ref Rng 12/17/2022 05/22/2023 08/26/2023 11/20/2023  Anti-Thyroglobulin Antibodies     IU/mL <1.0  <1.0     Thyroglobulin (ICMA)     ng/mL 7.3  0.3     Thyroglobulin by IMA     ng/mL Comment  Comment     TSH     0.45 - 5.00 UIU/ML      FREE T4     0.6 - 1.3 ng/dL  1.3  1.3  1.1   Thyroglobulin by IMA     1.5 - 38.5 ng/mL      Thyroglobulin     0.0 - 0.9 IU/mL      Thyroglobulin Ab     <4 IU/mL <1  <1  <1  <1   Thyroglobulin     2.0 - 50.0 ng/mL 7  0.3 (L)  0.2 (L)  0.2 (L)   TSH     0.450 - 5.330 uIU/mL  0.208 (L)  0.100 (L)  0.171 (L)    Component     Latest  Ref Rng 03/02/2024  Anti-Thyroglobulin Antibodies     IU/mL   Thyroglobulin (ICMA)     ng/mL   Thyroglobulin by IMA     ng/mL   TSH     0.45 - 5.00 UIU/ML   FREE T4     0.6 - 1.3 ng/dL   Thyroglobulin by IMA     1.5 - 38.5 ng/mL   Thyroglobulin     0.0 - 0.9 IU/mL   Thyroglobulin Ab     <4 IU/mL <1   Thyroglobulin     2.0 - 50.0 ng/mL   TSH     0.450 - 5.330 uIU/mL     Lab Results  Component Value Date   TSH 2.819 03/02/2024   FREET4 0.8 03/02/2024   Final Pathologic Diagnosis  A. THYROID , TOTAL THYROIDECTOMY:               Oncocytic carcinoma.                           Tumor location: left lobe.                            Tumor size: 5.5 cm in greatest dimension.                           Capsular invasion: Identified.                           Angioinvasion: Identified, focal (<4).                           Lymphatic invasion: No definite invasion seen.                           Extrathyroidal extension: Not identified.                            Surgical margins: Negative for tumor.                            Pathologic stage: T3a                           See  synoptic report.                            See comment.              Follicular adenoma (3.0 cm in greatest dimension) in the right lobe.SABRA   RAI 12/2022 A time-out procedure was performed before 20.3 mCi I-131 was administered via capsule on 12/19/2022.   RADIATION SAFETY RELEASE: The calculated maximum exposure to any person other than the patient for this dose was 69.43 mrem, below the 500 mrem limit for release from the hospital setting. Radiation safety precautions were discussed with the patient and a copy of these was provided.   Completed radioactive iodine administration for remnant thyroid  ablation, given residual 9.6% uptake within the neck on contemporaneous iodine imaging. The patient will return in 6 months for a 100 mCi post-surgical I-131 ablation for potential microscopic residual tumor, pending thyroglobulin levels.  Pertinent Imaging: CLINICAL  DATA:  Postop hypothyroidism   Hurthle cell neoplasm of thyroid    EXAM: THYROID  ULTRASOUND   TECHNIQUE: Ultrasound examination of the thyroid  gland and adjacent soft tissues was performed.   COMPARISON:  06/03/2023   FINDINGS: No residual thyroid  tissue is identified in the thyroidectomy bed.   No enlarged lymph nodes seen in the imaged portions of the neck.   IMPRESSION: No evidence of recurrent or residual thyroid  malignancy.   The above is in keeping with the ACR TI-RADS recommendations - J Am Coll Radiol 2017;14:587-595.  Assessment  Barbara Moore is a 33 y.o. female with a past medical history of above who presents for:   1. Postoperative hypothyroidism  Thyroglobulin Antibody (TGAb) and Thyroglobulin (Tg)   T4, Free   TSH   levothyroxine  (SYNTHROID ) 125 mcg tablet   US  Thyroid     2. Hurthle cell neoplasm of thyroid   Thyroglobulin Antibody (TGAb) and Thyroglobulin (Tg)   T4, Free   TSH   levothyroxine  (SYNTHROID ) 125 mcg tablet   US  Thyroid     3. Prediabetes        Plan   Postoperative  hypothyroidism status post total thyroidectomy (July 2023) and radioactive iodine ablation (March 2024) A: Patient is status post total thyroidectomy as well as radioactive iodine ablation with nuclear medicine team earlier this year.  Follow-up thyroglobulin levels and thyroid  function testing has been a TSH suppression goals.  Due to possibility of residual tissue on nuclear medicine imaging, there had been discussion of a repeat radioactive iodine ablation treatment scheduled for May 2025 which was rescheduled by patient. She is currently [redacted] weeks pregnant thus treatment will be delayed until 2 mo post delivery and if not nursing.   Will re eval testing to assess for biochemical and structural recurrence as well as assess the adequacy of her current thyroid  hormone replacement.  She has been strictly adherent to levothyroxine  therapy thus we will obtain her thyroid  function testing today and I will treat her as intermediate to high risk until nuclear medicine imaging treatment completed.  - Currently [redacted] weeks pregnant and has rescheduled radioactive iodine ablation treatment, which cannot be performed during pregnancy or lactation. - TSH levels will be targeted at the lowest part of the suppression goal, around 0.1. - Thyroid  levels are within the normal range, with a TSH of 4.470 and free T4 of 1.1 recorded earlier this month. Given her history of thyroid  cancer, it is prudent to maintain her TSH at the lower end of the suppression range, ideally at 0.5 or lower. Ultrasound from July 2025 showed no signs of recurrence. - Currently on levothyroxine  200 mcg daily. The dosage will be increased to 250 mcg daily, taken as two 125 mcg tablets each morning. - An order for an ultrasound has been placed for December 2025, prior to her due date. Blood work will be conducted today.  Plan: ATA Initial risk recurrence: Treating as intermediate-high risk with TSH target of 0.1 or less.  LT4 dose: Increase to  LT4 250 mcg daily given +hcg, will target TSH of 0.1 going forward thoroughout pregnancy.  TSH goal: 0.1-0.5 Next Tg: Ordered today Next US : Ordered today -- Dec 2025  Education/Counseling: Reviewed diagnosis of oncocytic thyroid  carcinoma, aggressive pathology and pathology results from surgery as well as treatment going forward which is strict adherence to levothyroxine  therapy targeting a TSH level 0.1-0.5.   2. History of Prediabetes A/P: Patient noted to have prediabetes and has made nutritional adjustment to her diet.  She  reports some weight loss and she is interested in medications to improve glycemic control as well as weight loss.  I will repeat her A1c and consider initiation of pharmacological therapy with GLP/GIP post pregnancy.   Return in about 2 months (around 09/05/2024) for post op hypothyroidism s/p thyroidectomy .  This document serves as a record of services personally performed by Ilona Davidovna Goukassian, DO.  It was created on their behalf by Lauraine Lake, CMA, a trained medical scribe, and Certified Medical Assistant (CMA). During the course of documenting the history, physical exam and medical decision making, I was functioning as a Stage manager. The creation of this record is the provider's dictation and/or activities during the visit.  Electronically signed by Lauraine Lake, CMA 07/06/2024 9:02 AM    I agree the documentation is accurate and complete.  Electronically signed by: Ilona D Goukassian, DO 07/06/2024 9:26 AM

## 2024-07-16 ENCOUNTER — Other Ambulatory Visit: Payer: Self-pay | Admitting: Genetic Counselor

## 2024-07-16 DIAGNOSIS — Z006 Encounter for examination for normal comparison and control in clinical research program: Secondary | ICD-10-CM

## 2024-07-27 ENCOUNTER — Encounter: Admitting: Physician Assistant

## 2024-08-02 ENCOUNTER — Other Ambulatory Visit: Payer: Self-pay | Admitting: *Deleted

## 2024-08-02 DIAGNOSIS — O0990 Supervision of high risk pregnancy, unspecified, unspecified trimester: Secondary | ICD-10-CM

## 2024-08-03 ENCOUNTER — Ambulatory Visit: Admitting: Obstetrics

## 2024-08-03 ENCOUNTER — Ambulatory Visit: Attending: Obstetrics and Gynecology

## 2024-08-03 ENCOUNTER — Ambulatory Visit: Admitting: Family Medicine

## 2024-08-03 ENCOUNTER — Other Ambulatory Visit: Payer: Self-pay

## 2024-08-03 ENCOUNTER — Other Ambulatory Visit

## 2024-08-03 VITALS — BP 113/72 | HR 109 | Wt 334.6 lb

## 2024-08-03 VITALS — BP 130/75

## 2024-08-03 DIAGNOSIS — Z23 Encounter for immunization: Secondary | ICD-10-CM | POA: Diagnosis not present

## 2024-08-03 DIAGNOSIS — O0990 Supervision of high risk pregnancy, unspecified, unspecified trimester: Secondary | ICD-10-CM

## 2024-08-03 DIAGNOSIS — E669 Obesity, unspecified: Secondary | ICD-10-CM

## 2024-08-03 DIAGNOSIS — D582 Other hemoglobinopathies: Secondary | ICD-10-CM | POA: Diagnosis present

## 2024-08-03 DIAGNOSIS — O403XX0 Polyhydramnios, third trimester, not applicable or unspecified: Secondary | ICD-10-CM

## 2024-08-03 DIAGNOSIS — Z3A29 29 weeks gestation of pregnancy: Secondary | ICD-10-CM | POA: Diagnosis present

## 2024-08-03 DIAGNOSIS — E890 Postprocedural hypothyroidism: Secondary | ICD-10-CM | POA: Diagnosis not present

## 2024-08-03 DIAGNOSIS — O3663X0 Maternal care for excessive fetal growth, third trimester, not applicable or unspecified: Secondary | ICD-10-CM | POA: Insufficient documentation

## 2024-08-03 DIAGNOSIS — O368130 Decreased fetal movements, third trimester, not applicable or unspecified: Secondary | ICD-10-CM | POA: Diagnosis not present

## 2024-08-03 DIAGNOSIS — O99213 Obesity complicating pregnancy, third trimester: Secondary | ICD-10-CM

## 2024-08-03 DIAGNOSIS — E039 Hypothyroidism, unspecified: Secondary | ICD-10-CM

## 2024-08-03 DIAGNOSIS — O0993 Supervision of high risk pregnancy, unspecified, third trimester: Secondary | ICD-10-CM

## 2024-08-03 DIAGNOSIS — O99212 Obesity complicating pregnancy, second trimester: Secondary | ICD-10-CM | POA: Diagnosis present

## 2024-08-03 DIAGNOSIS — Z3009 Encounter for other general counseling and advice on contraception: Secondary | ICD-10-CM

## 2024-08-03 DIAGNOSIS — O99283 Endocrine, nutritional and metabolic diseases complicating pregnancy, third trimester: Secondary | ICD-10-CM

## 2024-08-03 NOTE — Progress Notes (Signed)
 MFM Consult Note  Barbara Moore is currently at 29 weeks and 1 day.  She has been followed due to maternal obesity with a BMI of 55.5 and a large for gestational age fetus noted during her prior ultrasound exams.    She denies any problems since her last exam.  She just had her glucose screening test performed today. The results are pending.   Her blood pressure was 130/75.  Sonographic findings Single intrauterine pregnancy at 29w 1d.  Fetal cardiac activity:  Observed and appears normal. Presentation: Cephalic. Fetal biometry shows the estimated fetal weight of 4 pounds 6 ounces which measures at the > 99th percentile. Amniotic fluid volume: Within normal limits. AFI: 22.88 cm. MVP: 6.16 cm. Placenta: Anterior.  There are limitations of prenatal ultrasound such as the inability to detect certain abnormalities due to poor visualization. Various factors such as fetal position, gestational age and maternal body habitus may increase the difficulty in visualizing the fetal anatomy.    Large for gestational age fetus and maternal obesity We will await the results of her glucose screening test.   Should she screen positive for gestational diabetes, due to the large for gestational age fetus, weekly fetal testing should be started at 32 weeks.   Otherwise due to maternal obesity, we will start weekly fetal testing at 34 weeks. Delivery will be recommended at between 37 to 39 weeks depending on whether or not she develops gestational diabetes.    She will return in 5 weeks for a growth scan and BPP.  Should she screen positive for gestational diabetes, please start weekly NSTs at 32 weeks.  The patient stated that all of her questions were answered today.  A total of 20 minutes was spent counseling and coordinating the care for this patient.  Greater than 50% of the time was spent in direct face-to-face contact.

## 2024-08-03 NOTE — Progress Notes (Signed)
    PRENATAL VISIT NOTE  Subjective:  Barbara Moore is a 33 y.o. G2P1001 at [redacted]w[redacted]d being seen today for ongoing prenatal care.  She is currently monitored for the following issues for this high-risk pregnancy and has HSV-2 infection; BMI 50.0-59.9, adult (HCC); Hemoglobin C trait; Supervision of high risk pregnancy, antepartum; Hurthle cell carcinoma of thyroid  (HCC); Post-operative hypothyroidism; Anxiety; and Unwanted fertility on their problem list.  Patient reports no complaints.  Contractions: Not present. Vag. Bleeding: None.  Movement: (!) Decreased. Denies leaking of fluid.   The following portions of the patient's history were reviewed and updated as appropriate: allergies, current medications, past family history, past medical history, past social history, past surgical history and problem list.   Objective:    Vitals:   08/03/24 0858  BP: 113/72  Pulse: (!) 109  Weight: (!) 334 lb 9.6 oz (151.8 kg)    Fetal Status:  Fetal Heart Rate (bpm): 150 Fundal Height: 29 cm Movement: (!) Decreased    General: Alert, oriented and cooperative. Patient is in no acute distress.  Skin: Skin is warm and dry. No rash noted.   Cardiovascular: Normal heart rate noted  Respiratory: Normal respiratory effort, no problems with respiration noted  Abdomen: Soft, gravid, appropriate for gestational age.  Pain/Pressure: Present     Pelvic: Cervical exam deferred        Extremities: Normal range of motion.  Edema: None  Mental Status: Normal mood and affect. Normal behavior. Normal judgment and thought content.   Assessment and Plan:  Pregnancy: G2P1001 at [redacted]w[redacted]d 1. Supervision of high risk pregnancy, antepartum (Primary) 28 week labs today - Tdap vaccine greater than or equal to 7yo IM  2. Post-operative hypothyroidism Monitored by endocrinology Recent increase  3. Unwanted fertility Does not want more children BTL papers signed today  4. Decreased fetal movements in third trimester,  single or unspecified fetus NST:  Baseline: 150 bpm, Variability: Good {> 6 bpm), Accelerations: Reactive, and Decelerations: Absent - Fetal nonstress test; Future  5. [redacted] weeks gestation of pregnancy   Preterm labor symptoms and general obstetric precautions including but not limited to vaginal bleeding, contractions, leaking of fluid and fetal movement were reviewed in detail with the patient. Please refer to After Visit Summary for other counseling recommendations.   Return in 2 weeks (on 08/17/2024).  Future Appointments  Date Time Provider Department Center  08/03/2024  3:30 PM [facility] PROVIDER 1 [facility]  08/03/2024  3:45 PM [facility] US4 [facility]  08/10/2024 10:55 AM Nicholaus Burnard HERO, MD [facility]  08/24/2024  1:55 PM Nicholaus Jorene FORBES DEVONNA [facility]  09/07/2024  2:00 PM [facility] PROVIDER 1 [facility]  09/07/2024  2:15 PM [facility] US1 [facility]  09/07/2024  3:55 PM Eveline Lynwood MATSU, MD [facility]  09/21/2024  3:55 PM Milly Olam LABOR, CNM [facility]  09/28/2024  3:55 PM [facility] 1 [facility]  10/05/2024  3:55 PM Zina Jerilynn LABOR, MD [facility]  10/12/2024  3:55 PM [facility] 1 [facility]  10/19/2024  3:55 PM [facility] 1 [facility]    Glenys GORMAN Birk, MD

## 2024-08-03 NOTE — Progress Notes (Signed)
 BTL consent signed and filed to be scanned.   Vernell, RN  08/03/24

## 2024-08-04 ENCOUNTER — Ambulatory Visit: Payer: Self-pay | Admitting: Family Medicine

## 2024-08-04 ENCOUNTER — Other Ambulatory Visit: Payer: Self-pay | Admitting: *Deleted

## 2024-08-04 DIAGNOSIS — O3663X0 Maternal care for excessive fetal growth, third trimester, not applicable or unspecified: Secondary | ICD-10-CM

## 2024-08-04 DIAGNOSIS — O358XX0 Maternal care for other (suspected) fetal abnormality and damage, not applicable or unspecified: Secondary | ICD-10-CM

## 2024-08-04 DIAGNOSIS — O403XX0 Polyhydramnios, third trimester, not applicable or unspecified: Secondary | ICD-10-CM

## 2024-08-04 DIAGNOSIS — O0990 Supervision of high risk pregnancy, unspecified, unspecified trimester: Secondary | ICD-10-CM

## 2024-08-04 DIAGNOSIS — O99213 Obesity complicating pregnancy, third trimester: Secondary | ICD-10-CM

## 2024-08-04 DIAGNOSIS — Z3009 Encounter for other general counseling and advice on contraception: Secondary | ICD-10-CM

## 2024-08-04 LAB — CBC
Hematocrit: 35 % (ref 34.0–46.6)
Hemoglobin: 11.3 g/dL (ref 11.1–15.9)
MCH: 26.7 pg (ref 26.6–33.0)
MCHC: 32.3 g/dL (ref 31.5–35.7)
MCV: 83 fL (ref 79–97)
Platelets: 382 x10E3/uL (ref 150–450)
RBC: 4.23 x10E6/uL (ref 3.77–5.28)
RDW: 15.3 % (ref 11.7–15.4)
WBC: 7.6 x10E3/uL (ref 3.4–10.8)

## 2024-08-04 LAB — GLUCOSE TOLERANCE, 2 HOURS W/ 1HR
Glucose, 1 hour: 170 mg/dL (ref 70–179)
Glucose, 2 hour: 143 mg/dL (ref 70–152)
Glucose, Fasting: 86 mg/dL (ref 70–91)

## 2024-08-04 LAB — RPR: RPR Ser Ql: NONREACTIVE

## 2024-08-04 LAB — HIV ANTIBODY (ROUTINE TESTING W REFLEX): HIV Screen 4th Generation wRfx: NONREACTIVE

## 2024-08-10 ENCOUNTER — Encounter: Admitting: Obstetrics and Gynecology

## 2024-08-18 ENCOUNTER — Telehealth (INDEPENDENT_AMBULATORY_CARE_PROVIDER_SITE_OTHER): Admitting: Physician Assistant

## 2024-08-18 DIAGNOSIS — O99283 Endocrine, nutritional and metabolic diseases complicating pregnancy, third trimester: Secondary | ICD-10-CM

## 2024-08-18 DIAGNOSIS — F1020 Alcohol dependence, uncomplicated: Secondary | ICD-10-CM

## 2024-08-18 DIAGNOSIS — F419 Anxiety disorder, unspecified: Secondary | ICD-10-CM

## 2024-08-18 DIAGNOSIS — O98813 Other maternal infectious and parasitic diseases complicating pregnancy, third trimester: Secondary | ICD-10-CM

## 2024-08-18 DIAGNOSIS — E890 Postprocedural hypothyroidism: Secondary | ICD-10-CM | POA: Diagnosis not present

## 2024-08-18 DIAGNOSIS — O0990 Supervision of high risk pregnancy, unspecified, unspecified trimester: Secondary | ICD-10-CM

## 2024-08-18 DIAGNOSIS — Z3A31 31 weeks gestation of pregnancy: Secondary | ICD-10-CM

## 2024-08-18 DIAGNOSIS — R102 Pelvic and perineal pain unspecified side: Secondary | ICD-10-CM

## 2024-08-18 DIAGNOSIS — B009 Herpesviral infection, unspecified: Secondary | ICD-10-CM | POA: Diagnosis not present

## 2024-08-18 DIAGNOSIS — O99891 Other specified diseases and conditions complicating pregnancy: Secondary | ICD-10-CM

## 2024-08-18 DIAGNOSIS — Z6841 Body Mass Index (BMI) 40.0 and over, adult: Secondary | ICD-10-CM

## 2024-08-18 DIAGNOSIS — O99343 Other mental disorders complicating pregnancy, third trimester: Secondary | ICD-10-CM

## 2024-08-18 NOTE — Progress Notes (Signed)
 I connected with Barbara Moore today, 08/18/24 at 10:55 AM by: MyChart video and verified that I am speaking with the correct person using two identifiers. Patient is located at home and provider is located at [facility] med center.     I discussed the limitations, risks, security and privacy concerns of performing an evaluation and management service by MyChart video and the availability of in person appointments. I also discussed with the patient that there may be a patient responsible charge related to this service. By engaging in this virtual visit, you consent to the provision of healthcare.  Additionally, you authorize for your insurance to be billed for the services provided during this visit.  The patient expressed understanding and agreed to proceed.    PRENATAL VISIT NOTE  Subjective:  Barbara Moore is a 33 y.o. G2P1001 at [redacted]w[redacted]d being seen today for ongoing prenatal care.  She is currently monitored for the following issues for this high-risk pregnancy and has HSV-2 infection; BMI 50.0-59.9, adult (HCC); Hemoglobin C trait; Supervision of high risk pregnancy, antepartum; Hurthle cell carcinoma of thyroid  (HCC); Post-operative hypothyroidism; Anxiety; and Unwanted fertility on their problem list.  Patient reports no complaints.  Contractions: Not present. Vag. Bleeding: None.  Movement: Present. Denies leaking of fluid.   The following portions of the patient's history were reviewed and updated as appropriate: allergies, current medications, past family history, past medical history, past social history, past surgical history and problem list.   Objective:   There were no vitals filed for this visit.  Fetal Status:      Movement: Present    General: Alert, oriented and cooperative. Patient is in no acute distress.  Skin: Skin is warm and dry. No rash noted.   Cardiovascular: Normal heart rate noted  Respiratory: Normal respiratory effort, no problems with respiration noted  Abdomen: Soft,  gravid, appropriate for gestational age.  Pain/Pressure: Present     Pelvic: Cervical exam deferred        Extremities: Normal range of motion.  Edema: None  Mental Status: Normal mood and affect. Normal behavior. Normal judgment and thought content.      06/30/2024    1:32 PM 03/30/2024    4:44 PM 02/19/2019    9:14 AM  Depression screen PHQ 2/9  Decreased Interest 0 1 1  Down, Depressed, Hopeless 0 2 0  PHQ - 2 Score 0 3 1  Altered sleeping 0 2 1  Tired, decreased energy 3 3 2   Change in appetite 0 2 1  Feeling bad or failure about yourself  0 2 0  Trouble concentrating 0 1 1  Moving slowly or fidgety/restless 0 0 0  Suicidal thoughts 0 0 0  PHQ-9 Score 3  13  6       Data saved with a previous flowsheet row definition        06/30/2024    1:33 PM 03/30/2024    4:45 PM 02/19/2019    9:15 AM 10/28/2018    2:53 PM  GAD 7 : Generalized Anxiety Score  Nervous, Anxious, on Edge 0 2 1 0  Control/stop worrying 0 2 0 0  Worry too much - different things 0 3 0 0  Trouble relaxing 0 2 2 0  Restless 0 1 0 0  Easily annoyed or irritable 0 2 1 0  Afraid - awful might happen 0 3 0 0  Total GAD 7 Score 0 15 4 0  Anxiety Difficulty   Not difficult at all  Assessment and Plan:  Pregnancy: G2P1001 at [redacted]w[redacted]d  1. Supervision of high risk pregnancy, antepartum (Primary) Patient doing well, feeling regular fetal movement BP, FHR, FH appropriate   2. [redacted] weeks gestation of pregnancy Anticipatory guidance about next visits/weeks of pregnancy given.   3. BMI 50.0-59.9, adult (HCC) Continue ASA  4. Post-operative hypothyroidism Following endocrinology  Stable on synthroid  25 mcg  5. HSV-2 infection No outbreak since 2018  6. Anxiety Stable on Effexor 150 mg  7. Pelvic pain Using maternity band - Ambulatory referral to Physical Therapy   Preterm labor symptoms and general obstetric precautions including but not limited to vaginal bleeding, contractions, leaking of fluid and  fetal movement were reviewed in detail with the patient.  Please refer to After Visit Summary for other counseling recommendations.   Return in about 2 weeks (around 09/01/2024) for [facility].  Future Appointments  Date Time Provider Department Center  08/24/2024  1:55 PM Nicholaus Jorene FORBES DEVONNA [facility]  09/07/2024  2:00 PM [facility] PROVIDER 1 [facility]  09/07/2024  2:15 PM [facility] US1 [facility]  09/07/2024  3:55 PM Eveline Lynwood MATSU, MD [facility]  09/16/2024  3:30 PM [facility] PROVIDER 1 [facility]  09/16/2024  3:45 PM [facility] US2 [facility]  09/21/2024  3:55 PM Milly Olam DELENA EDDY [facility]  09/23/2024  3:30 PM [facility] PROVIDER 1 [facility]  09/23/2024  3:45 PM [facility] US3 [facility]  09/27/2024  3:55 PM Nicholaus Burnard HERO, MD [facility]  09/28/2024  2:15 PM [facility] PROVIDER 1 [facility]  09/28/2024  2:30 PM [facility] US4 [facility]  10/05/2024  2:15 PM [facility] PROVIDER 1 [facility]  10/05/2024  2:30 PM [facility] US4 [facility]  10/05/2024  3:55 PM Zina Jerilynn DELENA, MD [facility]  10/12/2024  4:15 PM Izell Harari, MD [facility]  10/19/2024  3:55 PM Regino, Camie DELENA, CNM [facility]    Jorene FORBES Nicholaus, PA-C

## 2024-08-20 LAB — GENECONNECT MOLECULAR SCREEN: Genetic Analysis Overall Interpretation: NEGATIVE

## 2024-08-23 ENCOUNTER — Ambulatory Visit: Attending: Physician Assistant | Admitting: Physical Therapy

## 2024-08-23 ENCOUNTER — Encounter: Payer: Self-pay | Admitting: Physical Therapy

## 2024-08-23 ENCOUNTER — Other Ambulatory Visit: Payer: Self-pay

## 2024-08-23 DIAGNOSIS — R279 Unspecified lack of coordination: Secondary | ICD-10-CM | POA: Diagnosis present

## 2024-08-23 DIAGNOSIS — R102 Pelvic and perineal pain unspecified side: Secondary | ICD-10-CM | POA: Diagnosis not present

## 2024-08-23 DIAGNOSIS — M6281 Muscle weakness (generalized): Secondary | ICD-10-CM | POA: Diagnosis present

## 2024-08-23 DIAGNOSIS — R293 Abnormal posture: Secondary | ICD-10-CM | POA: Diagnosis present

## 2024-08-23 NOTE — Patient Instructions (Signed)
 MOVE IN THE TUBE  A movement pattern for individuals experiencing pelvic and pubic symphysis pain when transferring or with movement   Spreading our legs wide can stretch on the region of musculature that attaches to our pubic bone, causing pain in individuals who are experiencing pelvic weakness, pregnancy, etc. To move in the tube is to move in a way that decreases the amount of strain/pressure on the pubic bone.  When transferring from bed to standing, or from seated to standing, it can be helpful to practice the following: Keep your knees together when moving to avoid externally rotating the legs  Move the legs as a unit to avoid single limb stance as much as possible  When climbing stairs, take small steps to avoid spreading the legs too far. You can also side step up the stairs if this is more comfortable.  When walking, take smaller steps to avoid spreading the legs too far  When climbing into a vehicle, side-saddle yourself into the seat to avoid externally rotating the legs     Try to wear your belly band whenever you are out and about in public or when you are grocery shopping or on your feet for a long period of time KT taping: use some body oil to gently remove this

## 2024-08-23 NOTE — Therapy (Signed)
 OUTPATIENT PHYSICAL THERAPY FEMALE PELVIC EVALUATION   Patient Name: Barbara Moore MRN: [id number] DOB:September 28, 1991, 33 y.o., female Today's Date: 08/23/2024  END OF SESSION:  PT End of Session - 08/23/24 1326     Visit Number 1    Number of Visits 10    Date for Recertification  11/01/24    Authorization Type Medicaid [facility]    PT Start Time 1145    PT Stop Time 1230    PT Time Calculation (min) 45 min    Activity Tolerance Patient tolerated treatment well    Behavior During Therapy [facility] for tasks assessed/performed          Past Medical History:  Diagnosis Date   Abnormal Pap smear of cervix 11/11/2018   ASCUS +HPV 10/2018     Cancer (HCC)    thyroid    Encounter for screening for other metabolic disorders 03/23/2024   Herpes    Lactose intolerance    Morbid obesity (HCC) 12/28/2021   Obesity    Possible exposure to STD 02/21/2021   Postoperative hypothyroidism 05/13/2022   Postpartum anemia 05/03/2019   Postpartum hemorrhage 05/03/2019   Seasonal allergies    Severe preeclampsia, delivered 05/01/2019   Sickle cell trait    Past Surgical History:  Procedure Laterality Date   NO PAST SURGERIES     THYROIDECTOMY     Patient Active Problem List   Diagnosis Date Noted   Unwanted fertility 06/08/2024   Anxiety 05/10/2024   Post-operative hypothyroidism 04/01/2024   Supervision of high risk pregnancy, antepartum 03/23/2024   Hurthle cell carcinoma of thyroid  (HCC) 05/13/2022   Hemoglobin C trait 11/11/2018   BMI 50.0-59.9, adult (HCC) 10/28/2018   HSV-2 infection 10/23/2018    [facility]  REFERRING PROVIDER: Nicholaus Jorene BRAVO, PA-C   REFERRING DIAG: R10.20 (ICD-10-CM) - Pelvic pain  THERAPY DIAG:  Muscle weakness (generalized)  Unspecified lack of coordination  Abnormal posture  Rationale for Evaluation and Treatment: Rehabilitation  ONSET DATE: April 2025   SUBJECTIVE:                                                                                                                                                                                            SUBJECTIVE STATEMENT: From eval: Pt reports to PFPT currently pregnant (32 weeks as of today) with pelvic pain that affects her sleep. She reports right sided pubic bone/adductor/groin pain with movement and after sitting for long periods. She uses KT tape and a pregnancy belt to help manage pain. Due date is 10/18/24 - could be induced at 38 or 39 weeks.  Fluid intake:  selzter waters and regular water; coffee 1 cup   FUNCTIONAL LIMITATIONS: transferring into and out of bed, transfers into and out of car, sitting for long periods of time   PERTINENT HISTORY:  Medications for current condition: none Surgeries: no Other: no  Sexual abuse: No  PAIN:  Are you having pain? Yes NPRS scale: 9/10 - without tape on; with tape 3-4/10  Pain location: Internal, Deep, Right, Anal, and Vaginal  Pain type: aching and sharp Pain description: intermittent   Aggravating factors: laying in bed, transfers Relieving factors: belt and tape at home for pain management   PRECAUTIONS: None  RED FLAGS: None   WEIGHT BEARING RESTRICTIONS: No  FALLS:  Has patient fallen in last 6 months? 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   ACTIVITY LEVEL : walking; stretches   PLOF: Independent  PATIENT GOALS: to relieve the pain   BOWEL MOVEMENT: Pain with bowel movement: No Type of bowel movement:Type (Bristol Stool Scale) 4, Frequency within normal limits , Strain no, and Splinting no Fully empty rectum: Yes:   Leakage: No                                                  Caused by:  Bowel urgency: no Pads: No Fiber supplement/laxative No  URINATION: Pain with urination: No Fully empty bladder: Yes:                                           Post-void dribble: No Stream: Strong Urgency: Yes  Frequency:during the day  within normal limits                                                         Nocturia: Yes: 2x/night   Leakage: none Pads/briefs: No  INTERCOURSE:   Ability to have vaginal penetration Yes  Pain with intercourse: none Dryness: No Climax: yes Marinoff Scale: 0/3  PREGNANCY: Vaginal deliveries 1 Tearing No Episiotomy No C-section deliveries 0 Currently pregnant Yes: 32 weeks as of today   PROLAPSE: Pressure  OBJECTIVE:  Note: Objective measures were completed at Evaluation unless otherwise noted.  PATIENT SURVEYS:  PFIQ-7: 45  COGNITION: Overall cognitive status: Within functional limits for tasks assessed     SENSATION: Light touch: Appears intact  LUMBAR SPECIAL TESTS:  Single leg stance test: Positive  FUNCTIONAL TESTS:  Single leg stance:  Rt: +   Lt: + Sit-up test: 1/3  Squat: within functional limits but painful  Bed mobility: within functional limits but painful at pubic bone   GAIT: Assistive device utilized: None Comments: moderate trendelenburg gait pattern with ambulation   POSTURE: rounded shoulders, forward head, increased lumbar lordosis, increased thoracic kyphosis, and flexed trunk   LUMBARAROM/PROM: within functional limits for all motions with pain involving hip flexion   LOWER EXTREMITY ROM: within functional limits for all motions bilaterally with no pain   LOWER EXTREMITY MMT:  MMT Right eval Left eval  Hip flexion 4 4  Hip extension 3 3  Hip abduction 4 4  Hip adduction 4 4  Hip internal rotation    Hip external rotation    Knee flexion 4 4  Knee extension    Ankle dorsiflexion    Ankle plantarflexion    Ankle inversion    Ankle eversion     (Blank rows = not tested) PALPATION:  General: no tenderness to palpation of bilateral hip flexors, some tenderness at adductors bilaterally and SIJs bilateral   Pelvic Alignment: anterior pelvic tilt   Abdominal: abdominal bracing at rest   Diastasis: No Distortion: No   Breathing: apical breathing pattern  Scar tissue: No Active Straight Leg Raise: +                External Perineal Exam: not performed today due to time limitations                              Internal Pelvic Floor: not performed today due to time limitations   Patient confirms identification and approves PT to assess internal pelvic floor and treatment Yes All internal or external pelvic floor assessments and/or treatments are completed with proper hand hygiene and gloves hands. If needed gloves are changed with hand hygiene during patient care time.  PELVIC MMT: not performed today due to time limitations    MMT eval  Vaginal   Internal Anal Sphincter   External Anal Sphincter   Puborectalis   (Blank rows = not tested)       TONE: Increased tone at bilateral lumbar paraspinals, gluteals, and adductors   PROLAPSE: not performed today due to time limitations   TODAY'S TREATMENT:                                                                                                                              DATE:   EVAL 08/23/24: Examination completed, findings reviewed, pt educated on POC. Pt motivated to participate in PT and agreeable to attempt recommendations.   - Seated Hip Adduction Squeeze with Ball  - 1 x daily - 7 x weekly - 2 sets - 10 reps - Supine Lower Trunk Rotation  - 1 x daily - 7 x weekly - 2 sets - 10 reps - Sidelying Thoracic Rotation with Open Book  - 1 x daily - 7 x weekly - 2 sets - 10 reps  PATIENT EDUCATION:  Education details: relative anatomy and the connection between the diaprhagm and pelvic floor; belly band education; KT tape education for pubic bone support; move in the tube position  Person educated: Patient Education method: Explanation, Demonstration, Tactile cues, Verbal cues, and Handouts Education comprehension: verbalized understanding, returned demonstration, verbal cues required, tactile cues required, and needs further education  HOME  EXERCISE PROGRAM: Access Code: [email or web address] URL: [email or web address] Date: 08/23/2024 Prepared by: Celena Domino  Exercises - Seated Hip Adduction Squeeze with Ball  - 1 x daily - 7 x weekly - 2 sets - 10  reps - Supine Lower Trunk Rotation  - 1 x daily - 7 x weekly - 2 sets - 10 reps - Sidelying Thoracic Rotation with Open Book  - 1 x daily - 7 x weekly - 2 sets - 10 reps  Patient Education - Get To Know Your Pelvic Floor- Female - Lifestyle Changes that Support Urinary Health  ASSESSMENT:  CLINICAL IMPRESSION: Patient is a 33 y.o. female  who was seen today for physical therapy evaluation and treatment for pelvic pain during her second pregnancy (due January 2026). She reports pubic bone/adductor pain that is present with transfers and when sitting for long periods of time, reaching an 9/10 on pain scale. She manages this pain currently with use of pelvic belt for pregnancy support and KT tape on abdomen to offload the pubic bone. Education provided on how to tape belly properly for pubic bone relief and when to wear belly band. She was provided with gentle adductor strengthening/stretching exercises to start providing pelvic floor support to her abdomen. Pt tolerated session well and reports less pubic bone pain at end of session. Overall, Pt would benefit from additional PT to further address deficits.     OBJECTIVE IMPAIRMENTS: decreased coordination, decreased endurance, decreased mobility, decreased ROM, decreased strength, and pain.   ACTIVITY LIMITATIONS: carrying, lifting, bending, sitting, standing, squatting, stairs, transfers, and bed mobility  PARTICIPATION LIMITATIONS: meal prep, cleaning, laundry, community activity, occupation, and yard work  PERSONAL FACTORS: Age, Past/current experiences, and Time since onset of injury/illness/exacerbation are also affecting patient's functional outcome.   REHAB POTENTIAL: Good  CLINICAL DECISION MAKING:  Stable/uncomplicated  EVALUATION COMPLEXITY: Low   GOALS: Goals reviewed with patient? Yes  SHORT TERM GOALS: Target date: 09/20/2024  Pt will be independent with HEP.  Baseline: Goal status: INITIAL  2.  Pt will be independent with diaphragmatic breathing and down training activities in order to improve pelvic floor relaxation. Baseline:  Goal status: INITIAL  3.  Pt will be independent with use of squatty potty, relaxed toileting mechanics, and improved bowel movement techniques in order to increase ease of bowel movements and complete evacuation.  Baseline:  Goal status: INITIAL  4.  Pt will be independent with the knack, urge suppression technique, and double voiding in order to improve bladder habits and decrease urinary incontinence.  Baseline:  Goal status: INITIAL  LONG TERM GOALS: Target date: 02/20/2025  Pt will be independent with advanced HEP.  Baseline:  Goal status: INITIAL  2.  Pt will report 50% reduction of pain due to improvements in posture, strength, and muscle length in pelvic floor musculature to improve quality of life and decrease pain leading up to birth.  Baseline:  Goal status: INITIAL  3.  Pt will report confidence with positioning for labor and delivery to ensure that her birthing experience is as pain-free as possible due to optimal pelvic positioning. Baseline:  Goal status: INITIAL  4.  Patient will report confidence with transferring in a movement pattern that protects the pubic bone (adduction of hips) to prevent pain with transfers leading up to birth of second child.  Baseline:  Goal status: INITIAL  PLAN:  PT FREQUENCY: 1-2x/week  PT DURATION: 10 weeks  PLANNED INTERVENTIONS: 97110-Therapeutic exercises, 97530- Therapeutic activity, 97112- Neuromuscular re-education, 97535- Self Care, [id number]- Manual therapy, Patient/Family education, Balance training, Stair training, Taping, Joint mobilization, Spinal mobilization, Scar  mobilization, Cryotherapy, Moist heat, and Biofeedback  PLAN FOR NEXT SESSION: continued pelvic floor relaxation techniques and stretches; manual to adductor and hips  and glutes; strengthening of core and gluteals   Celena JAYSON Domino, PT 08/23/2024, 1:27 PM

## 2024-08-24 ENCOUNTER — Encounter: Admitting: Physician Assistant

## 2024-08-26 ENCOUNTER — Ambulatory Visit: Admitting: Physical Therapy

## 2024-08-27 ENCOUNTER — Telehealth: Payer: Self-pay

## 2024-08-27 DIAGNOSIS — F419 Anxiety disorder, unspecified: Secondary | ICD-10-CM

## 2024-08-27 NOTE — Telephone Encounter (Signed)
 Patient left voicemail requesting to know if [facility] provider would refill her effexor .  Her PCP has not filled it due to pregnancy.    [place], RN

## 2024-08-30 MED ORDER — VENLAFAXINE HCL ER 150 MG PO CP24: 150.0000 mg | ORAL_CAPSULE | Freq: Every day | ORAL | 1 refills | Status: AC

## 2024-08-30 NOTE — Telephone Encounter (Signed)
 Refill of Effexor  approved by Dr. Cresenzo and was e-prescribed. Mychart message sent to pt.

## 2024-08-30 NOTE — Addendum Note (Signed)
 Addended by: MICHAE LOA CROME on: 08/30/2024 09:50 AM   Modules accepted: Orders

## 2024-09-07 ENCOUNTER — Ambulatory Visit: Admitting: Obstetrics & Gynecology

## 2024-09-07 ENCOUNTER — Ambulatory Visit: Admitting: Maternal & Fetal Medicine

## 2024-09-07 ENCOUNTER — Ambulatory Visit: Admitting: Physical Therapy

## 2024-09-07 ENCOUNTER — Ambulatory Visit: Attending: Obstetrics and Gynecology

## 2024-09-07 VITALS — BP 119/86 | HR 98 | Wt 339.0 lb

## 2024-09-07 VITALS — BP 125/85 | HR 103

## 2024-09-07 DIAGNOSIS — Z3A34 34 weeks gestation of pregnancy: Secondary | ICD-10-CM

## 2024-09-07 DIAGNOSIS — O99212 Obesity complicating pregnancy, second trimester: Secondary | ICD-10-CM | POA: Diagnosis present

## 2024-09-07 DIAGNOSIS — O3663X0 Maternal care for excessive fetal growth, third trimester, not applicable or unspecified: Secondary | ICD-10-CM

## 2024-09-07 DIAGNOSIS — O0990 Supervision of high risk pregnancy, unspecified, unspecified trimester: Secondary | ICD-10-CM | POA: Diagnosis present

## 2024-09-07 DIAGNOSIS — D582 Other hemoglobinopathies: Secondary | ICD-10-CM | POA: Insufficient documentation

## 2024-09-07 DIAGNOSIS — E890 Postprocedural hypothyroidism: Secondary | ICD-10-CM

## 2024-09-07 NOTE — Progress Notes (Signed)
 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 G2P1001 at [redacted]w[redacted]d here for ultrasound and consultation. Delita Schoene is doing well today with no acute concerns. Today we focused on the following:   Patient is here for growth ultrasound due to elevated BMI and hypothyroidism.  She has no new concerns today.  I also discussed that the ultrasound findings does show a large for gestational age.  LGA The estimated fetal weight on today's ultrasound is consistent with large for gestational age (LGA), measuring in the ___% percentile overall (insert actual percentile). I discussed the diagnosis, management, and clinical significance of an LGA fetus. Risk factors for LGA and fetal macrosomia include constitutional/genetic growth patterns, maternal pre-pregnancy obesity, excessive gestational weight gain, dyslipidemia, and preexisting or gestational diabetes. A prior history of macrosomic newborns (defined as birth weight >4000 g) or a post-term pregnancy also increases recurrence risk. These points are consistent with risk factors outlined in the provided Macrosomia guideline.  I explained that the diagnosis of LGA is made by prenatal ultrasound; however, ultrasound estimation of fetal weight is imprecise, and third-trimester EFW may vary by 10-20%, with accuracy decreasing as the fetus grows larger. Ultrasound prediction of  birthweight becomes less reliable as fetal size exceeds 4000 g, and this limitation should be considered in delivery planning. Although LGA indicates accelerated fetal growth for gestational age, macrosomia is formally defined as an absolute birth weight >=4000 g regardless of gestational age.  We reviewed the maternal and neonatal risks associated with suspected macrosomia, including labor abnormalities, increased risk of cesarean delivery, postpartum hemorrhage, shoulder dystocia, brachial plexus injury, and neonatal fractures. These risks increase proportionally as fetal weight rises. Based on the data from the guideline, cesarean delivery may be considered when EFW exceeds 5000 g in non-diabetic patients or 4500 g in diabetic patients, as these thresholds are associated with the highest rates of shoulder dystocia and neonatal injury. Importantly, shoulder dystocia and neonatal injury cannot be reliably predicted and fetal damage can often precede delivery. However, below these thresholds, planned cesarean delivery is not routinely recommended, and many patients elect to pursue vaginal birth after informed counseling regarding risks, benefits, and alternatives.  We discussed that if current fetal growth continues, the estimated fetal weight at 39 weeks will likely be approximately ___ g, which does not meet criteria for a recommended elective cesarean delivery based on evidence-based thresholds. Some patients still choose a cesarean for anxiety reduction or perceived safety; however, I explained that the guideline does not recommend routine cesarean delivery in this setting solely for suspected macrosomia. The patient expressed understanding and prefers vaginal delivery, acknowledging the potential risks associated with an LGA fetus.  She will continue weekly non-stress tests (NSTs) and routine prenatal care. All questions were answered, and she is comfortable with the plan.  Recommendations -Continue  weekly antenatal testing at 32 weeks  -Repeat growth ultrasound in 4-5 weeks to reassess fetal weight trajectory. -Dietary discretion  and moderate exercise as tolerated to minimize excessive maternal and fetal weight gain.  -If EFW at term exceeds 5000 g (non-diabetic) or 4500 g (diabetic), discuss option of scheduled cesarean delivery per guideline thresholds. Otherwise, proceed with planned vaginal delivery with preparedness for shoulder dystocia maneuvers during labor.  I spent 30 minutes reviewing the patients chart, including labs and images as well as counseling the patient about her medical conditions. Greater than 50% of the time was spent in direct face-to-face patient counseling.  Delora Smaller  MFM, [place]   09/07/2024  4:53 PM   Review of Systems: A review of systems was performed and was negative except per HPI   Vitals and Physical Exam    09/07/2024    3:44 PM 09/07/2024    2:13 PM 08/03/2024    3:48 PM  Vitals with BMI  Weight 339 lbs    Systolic 119 125 869  Diastolic 86 85 75  Pulse 98 103     Sitting comfortably on the sonogram table Nonlabored breathing Normal rate and rhythm Abdomen is nontender  Past pregnancies OB History  Gravida Para Term Preterm AB Living  2 1 1   1   SAB IAB Ectopic Multiple Live Births     0 1    # Outcome Date GA Lbr Len/2nd Weight Sex Type Anes PTL Lv  2 Current           1 Term 05/02/19 [redacted]w[redacted]d 07:00 / 00:26 7 lb 14.3 oz (3.581 kg) M Vag-Spont EPI  LIV     Birth Comments: WNL     Future Appointments  Date Time Provider Department Center  09/16/2024  3:30 PM [facility] PROVIDER 1 [facility]  09/16/2024  3:45 PM [facility] US2 [facility]  09/21/2024  3:55 PM Milly Olam DELENA EDDY [facility]  09/23/2024  3:30 PM [facility] PROVIDER 1 [facility]  09/23/2024  3:45 PM [facility] US3 [facility]  09/27/2024  3:55 PM Nicholaus Burnard HERO, MD [facility]  09/28/2024  2:15 PM [facility] PROVIDER 1 [facility]  09/28/2024  2:30 PM  [facility] US4 [facility]  10/05/2024  2:15 PM [facility] PROVIDER 1 [facility]  10/05/2024  2:30 PM [facility] US4 [facility]  10/05/2024  3:55 PM Zina Jerilynn DELENA, MD [facility]  10/12/2024  4:15 PM Izell Harari, MD [facility]  10/13/2024 11:00 AM Donah Darryle RAMAN, PT [facility] None  10/18/2024 11:00 AM Donah Darryle RAMAN, PT [facility] None  10/19/2024  3:55 PM Regino, Camie DELENA, CNM [facility]

## 2024-09-07 NOTE — Progress Notes (Signed)
 PRENATAL VISIT NOTE  Subjective:  Barbara Moore is a 33 y.o. G2P1001 at [redacted]w[redacted]d being seen today for ongoing prenatal care.  She is currently monitored for the following issues for this high-risk pregnancy and has HSV-2 infection; BMI 50.0-59.9, adult (HCC); Hemoglobin C trait; Supervision of high risk pregnancy, antepartum; Hurthle cell carcinoma of thyroid  (HCC); Post-operative hypothyroidism; Anxiety; and Unwanted fertility on their problem list.  Patient reports no complaints.  Contractions: Not present. Vag. Bleeding: None.  Movement: Present. Denies leaking of fluid.   The following portions of the patient's history were reviewed and updated as appropriate: allergies, current medications, past family history, past medical history, past social history, past surgical history and problem list.   Objective:   Vitals:   09/07/24 1544  BP: 119/86  Pulse: 98  Weight: (!) 339 lb (153.8 kg)    Fetal Status:      Movement: Present    General: Alert, oriented and cooperative. Patient is in no acute distress.  Skin: Skin is warm and dry. No rash noted.   Cardiovascular: Normal heart rate noted  Respiratory: Normal respiratory effort, no problems with respiration noted  Abdomen: Soft, gravid, appropriate for gestational age.  Pain/Pressure: Present     Pelvic: Cervical exam deferred        Extremities: Normal range of motion.  Edema: Trace  Mental Status: Normal mood and affect. Normal behavior. Normal judgment and thought content.      06/30/2024    1:32 PM 03/30/2024    4:44 PM 02/19/2019    9:14 AM  Depression screen PHQ 2/9  Decreased Interest 0 1 1  Down, Depressed, Hopeless 0 2 0  PHQ - 2 Score 0 3 1  Altered sleeping 0 2 1  Tired, decreased energy 3 3 2   Change in appetite 0 2 1  Feeling bad or failure about yourself  0 2 0  Trouble concentrating 0 1 1  Moving slowly or fidgety/restless 0 0 0  Suicidal thoughts 0 0 0  PHQ-9 Score 3  13  6       Data saved with a previous  flowsheet row definition        06/30/2024    1:33 PM 03/30/2024    4:45 PM 02/19/2019    9:15 AM 10/28/2018    2:53 PM  GAD 7 : Generalized Anxiety Score  Nervous, Anxious, on Edge 0 2 1 0  Control/stop worrying 0 2 0 0  Worry too much - different things 0 3 0 0  Trouble relaxing 0 2 2 0  Restless 0 1 0 0  Easily annoyed or irritable 0 2 1 0  Afraid - awful might happen 0 3 0 0  Total GAD 7 Score 0 15 4 0  Anxiety Difficulty   Not difficult at all     Assessment and Plan:  Pregnancy: G2P1001 at [redacted]w[redacted]d 1. Supervision of high risk pregnancy, antepartum (Primary) Weekly fetal testing  2. Post-operative hypothyroidism   Preterm labor symptoms and general obstetric precautions including but not limited to vaginal bleeding, contractions, leaking of fluid and fetal movement were reviewed in detail with the patient. Please refer to After Visit Summary for other counseling recommendations.   Return in about 1 week (around 09/14/2024).  Future Appointments  Date Time Provider Department Center  09/16/2024  3:30 PM [facility] PROVIDER 1 [facility]  09/16/2024  3:45 PM [facility] US2 [facility]  09/21/2024  3:55 PM Milly Olam DELENA EDDY [facility]  09/23/2024  3:30 PM [facility] PROVIDER 1 [facility]  09/23/2024  3:45 PM [facility] US3 [facility]  09/27/2024  3:55 PM Nicholaus Burnard HERO, MD [facility]  09/28/2024  2:15 PM [facility] PROVIDER 1 [facility]  09/28/2024  2:30 PM [facility] US4 [facility]  10/05/2024  2:15 PM [facility] PROVIDER 1 [facility]  10/05/2024  2:30 PM [facility] US4 [facility]  10/05/2024  3:55 PM Zina Jerilynn LABOR, MD [facility]  10/12/2024  4:15 PM Izell Harari, MD [facility]  10/13/2024 11:00 AM Donah Darryle RAMAN, PT [facility] None  10/18/2024 11:00 AM Donah Darryle RAMAN, PT [facility] None  10/19/2024  3:55 PM Regino, Camie LABOR, CNM [facility]    Lynwood Solomons, MD

## 2024-09-10 ENCOUNTER — Ambulatory Visit: Admitting: Physical Therapy

## 2024-09-15 ENCOUNTER — Encounter (HOSPITAL_COMMUNITY): Payer: Self-pay | Admitting: Obstetrics and Gynecology

## 2024-09-15 ENCOUNTER — Other Ambulatory Visit: Payer: Self-pay

## 2024-09-15 ENCOUNTER — Inpatient Hospital Stay (HOSPITAL_COMMUNITY)
Admission: AD | Admit: 2024-09-15 | Discharge: 2024-09-19 | DRG: 806 | Disposition: A | Attending: Obstetrics & Gynecology | Admitting: Obstetrics & Gynecology

## 2024-09-15 DIAGNOSIS — Z85850 Personal history of malignant neoplasm of thyroid: Secondary | ICD-10-CM

## 2024-09-15 DIAGNOSIS — O1493 Unspecified pre-eclampsia, third trimester: Secondary | ICD-10-CM | POA: Diagnosis present

## 2024-09-15 DIAGNOSIS — Z833 Family history of diabetes mellitus: Secondary | ICD-10-CM

## 2024-09-15 DIAGNOSIS — R7989 Other specified abnormal findings of blood chemistry: Secondary | ICD-10-CM | POA: Diagnosis not present

## 2024-09-15 DIAGNOSIS — O99214 Obesity complicating childbirth: Secondary | ICD-10-CM | POA: Diagnosis present

## 2024-09-15 DIAGNOSIS — O26893 Other specified pregnancy related conditions, third trimester: Secondary | ICD-10-CM | POA: Diagnosis not present

## 2024-09-15 DIAGNOSIS — D573 Sickle-cell trait: Secondary | ICD-10-CM | POA: Diagnosis present

## 2024-09-15 DIAGNOSIS — Z79890 Hormone replacement therapy (postmenopausal): Secondary | ICD-10-CM

## 2024-09-15 DIAGNOSIS — O3660X0 Maternal care for excessive fetal growth, unspecified trimester, not applicable or unspecified: Secondary | ICD-10-CM | POA: Diagnosis present

## 2024-09-15 DIAGNOSIS — E890 Postprocedural hypothyroidism: Secondary | ICD-10-CM | POA: Diagnosis present

## 2024-09-15 DIAGNOSIS — O99213 Obesity complicating pregnancy, third trimester: Secondary | ICD-10-CM | POA: Diagnosis present

## 2024-09-15 DIAGNOSIS — Z3A35 35 weeks gestation of pregnancy: Secondary | ICD-10-CM

## 2024-09-15 DIAGNOSIS — Z8759 Personal history of other complications of pregnancy, childbirth and the puerperium: Secondary | ICD-10-CM

## 2024-09-15 DIAGNOSIS — Z79899 Other long term (current) drug therapy: Secondary | ICD-10-CM

## 2024-09-15 DIAGNOSIS — B009 Herpesviral infection, unspecified: Secondary | ICD-10-CM | POA: Diagnosis present

## 2024-09-15 DIAGNOSIS — O9832 Other infections with a predominantly sexual mode of transmission complicating childbirth: Secondary | ICD-10-CM | POA: Diagnosis present

## 2024-09-15 DIAGNOSIS — O409XX0 Polyhydramnios, unspecified trimester, not applicable or unspecified: Secondary | ICD-10-CM | POA: Diagnosis present

## 2024-09-15 DIAGNOSIS — D582 Other hemoglobinopathies: Secondary | ICD-10-CM

## 2024-09-15 DIAGNOSIS — O3663X0 Maternal care for excessive fetal growth, third trimester, not applicable or unspecified: Secondary | ICD-10-CM | POA: Diagnosis present

## 2024-09-15 DIAGNOSIS — Z3009 Encounter for other general counseling and advice on contraception: Secondary | ICD-10-CM | POA: Diagnosis present

## 2024-09-15 DIAGNOSIS — A6000 Herpesviral infection of urogenital system, unspecified: Secondary | ICD-10-CM | POA: Diagnosis present

## 2024-09-15 DIAGNOSIS — F419 Anxiety disorder, unspecified: Secondary | ICD-10-CM | POA: Diagnosis present

## 2024-09-15 DIAGNOSIS — O99344 Other mental disorders complicating childbirth: Secondary | ICD-10-CM | POA: Diagnosis present

## 2024-09-15 DIAGNOSIS — O0990 Supervision of high risk pregnancy, unspecified, unspecified trimester: Secondary | ICD-10-CM

## 2024-09-15 DIAGNOSIS — F32A Depression, unspecified: Secondary | ICD-10-CM | POA: Diagnosis present

## 2024-09-15 DIAGNOSIS — Z8249 Family history of ischemic heart disease and other diseases of the circulatory system: Secondary | ICD-10-CM

## 2024-09-15 DIAGNOSIS — O1414 Severe pre-eclampsia complicating childbirth: Principal | ICD-10-CM | POA: Diagnosis present

## 2024-09-15 DIAGNOSIS — O99284 Endocrine, nutritional and metabolic diseases complicating childbirth: Secondary | ICD-10-CM | POA: Diagnosis present

## 2024-09-15 DIAGNOSIS — O403XX0 Polyhydramnios, third trimester, not applicable or unspecified: Secondary | ICD-10-CM | POA: Diagnosis present

## 2024-09-15 HISTORY — DX: Personal history of other complications of pregnancy, childbirth and the puerperium: Z87.59

## 2024-09-15 LAB — COMPREHENSIVE METABOLIC PANEL WITH GFR
ALT: 107 U/L — ABNORMAL HIGH (ref 0–44)
ALT: 97 U/L — ABNORMAL HIGH (ref 0–44)
AST: 46 U/L — ABNORMAL HIGH (ref 15–41)
AST: 44 U/L — ABNORMAL HIGH (ref 15–41)
Albumin: 2.8 g/dL — ABNORMAL LOW (ref 3.5–5.0)
Albumin: 2.5 g/dL — ABNORMAL LOW (ref 3.5–5.0)
Alkaline Phosphatase: 136 U/L — ABNORMAL HIGH (ref 38–126)
Alkaline Phosphatase: 133 U/L — ABNORMAL HIGH (ref 38–126)
Anion gap: 10 (ref 5–15)
Anion gap: 9 (ref 5–15)
BUN: 11 mg/dL (ref 6–20)
BUN: 10 mg/dL (ref 6–20)
CO2: 19 mmol/L — ABNORMAL LOW (ref 22–32)
CO2: 21 mmol/L — ABNORMAL LOW (ref 22–32)
Calcium: 9.1 mg/dL (ref 8.9–10.3)
Calcium: 8.7 mg/dL — ABNORMAL LOW (ref 8.9–10.3)
Chloride: 106 mmol/L (ref 98–111)
Chloride: 104 mmol/L (ref 98–111)
Creatinine, Ser: 0.81 mg/dL (ref 0.44–1.00)
Creatinine, Ser: 0.8 mg/dL (ref 0.44–1.00)
GFR, Estimated: >60 mL/min (ref >60)
GFR, Estimated: >60 mL/min (ref >60)
Glucose, Bld: 106 mg/dL — ABNORMAL HIGH (ref 70–99)
Glucose, Bld: 91 mg/dL (ref 70–99)
Potassium: 3.8 mmol/L (ref 3.5–5.1)
Potassium: 3.7 mmol/L (ref 3.5–5.1)
Sodium: 135 mmol/L (ref 135–145)
Sodium: 134 mmol/L — ABNORMAL LOW (ref 135–145)
Total Bilirubin: 0.7 mg/dL (ref 0.0–1.2)
Total Bilirubin: 0.7 mg/dL (ref 0.0–1.2)
Total Protein: 7 g/dL (ref 6.5–8.1)
Total Protein: 6.3 g/dL — ABNORMAL LOW (ref 6.5–8.1)

## 2024-09-15 LAB — URINALYSIS, ROUTINE W REFLEX MICROSCOPIC
Bacteria, UA: NONE SEEN
Bilirubin Urine: NEGATIVE
Glucose, UA: NEGATIVE mg/dL
Hgb urine dipstick: NEGATIVE
Ketones, ur: 20 mg/dL — AB
Leukocytes,Ua: NEGATIVE
Nitrite: NEGATIVE
Protein, ur: 30 mg/dL — AB
Specific Gravity, Urine: 1.029 (ref 1.005–1.030)
pH: 5 (ref 5.0–8.0)

## 2024-09-15 LAB — CBC
HCT: 34.3 % — ABNORMAL LOW (ref 36.0–46.0)
HCT: 31 % — ABNORMAL LOW (ref 36.0–46.0)
Hemoglobin: 11.7 g/dL — ABNORMAL LOW (ref 12.0–15.0)
Hemoglobin: 10.9 g/dL — ABNORMAL LOW (ref 12.0–15.0)
MCH: 26.7 pg (ref 26.0–34.0)
MCH: 27.3 pg (ref 26.0–34.0)
MCHC: 34.1 g/dL (ref 30.0–36.0)
MCHC: 35.2 g/dL (ref 30.0–36.0)
MCV: 78.1 fL — ABNORMAL LOW (ref 80.0–100.0)
MCV: 77.7 fL — ABNORMAL LOW (ref 80.0–100.0)
Platelets: 317 K/uL (ref 150–400)
Platelets: 283 K/uL (ref 150–400)
RBC: 4.39 MIL/uL (ref 3.87–5.11)
RBC: 3.99 MIL/uL (ref 3.87–5.11)
RDW: 14.8 % (ref 11.5–15.5)
RDW: 14.8 % (ref 11.5–15.5)
WBC: 7.7 K/uL (ref 4.0–10.5)
WBC: 7.1 K/uL (ref 4.0–10.5)
nRBC: 0 % (ref 0.0–0.2)
nRBC: 0 % (ref 0.0–0.2)

## 2024-09-15 LAB — PROTEIN / CREATININE RATIO, URINE
Creatinine, Urine: 284 mg/dL
Protein Creatinine Ratio: 0.11 mg/mg{creat} (ref 0.00–0.15)
Total Protein, Urine: 31 mg/dL

## 2024-09-15 LAB — GC/CHLAMYDIA PROBE AMP (~~LOC~~) NOT AT ARMC
Chlamydia: NEGATIVE
Comment: NEGATIVE
Comment: NORMAL
Neisseria Gonorrhea: NEGATIVE

## 2024-09-15 LAB — TSH: TSH: 1.772 u[IU]/mL (ref 0.350–4.500)

## 2024-09-15 LAB — TYPE AND SCREEN
ABO/RH(D): A POS
Antibody Screen: NEGATIVE

## 2024-09-15 MED ORDER — LEVOTHYROXINE SODIUM 25 MCG PO TABS: 25.0000 ug | ORAL_TABLET | Freq: Every day | ORAL | Status: DC

## 2024-09-15 MED ORDER — LABETALOL HCL 5 MG/ML IV SOLN: 40.0000 mg | INTRAVENOUS | Status: DC | PRN

## 2024-09-15 MED ORDER — MAGNESIUM SULFATE BOLUS VIA INFUSION
6.0000 g | Freq: Once | INTRAVENOUS | Status: AC
  Administered 2024-09-15: 6 g via INTRAVENOUS
  Filled 2024-09-15: qty 1000

## 2024-09-15 MED ORDER — SODIUM CHLORIDE 0.9 % IV SOLN: INTRAVENOUS | Status: AC

## 2024-09-15 MED ORDER — PRENATAL MULTIVITAMIN CH
1.0000 | ORAL_TABLET | Freq: Every day | ORAL | Status: DC
  Administered 2024-09-15: 1 via ORAL
  Filled 2024-09-15: qty 1

## 2024-09-15 MED ORDER — CALCIUM CARBONATE ANTACID 500 MG PO CHEW: 2.0000 | CHEWABLE_TABLET | ORAL | Status: DC | PRN

## 2024-09-15 MED ORDER — VENLAFAXINE HCL ER 150 MG PO CP24
150.0000 mg | ORAL_CAPSULE | Freq: Every day | ORAL | Status: DC
  Administered 2024-09-15 – 2024-09-18 (×4): 150 mg via ORAL
  Filled 2024-09-15 (×6): qty 1

## 2024-09-15 MED ORDER — LABETALOL HCL 5 MG/ML IV SOLN: 80.0000 mg | INTRAVENOUS | Status: DC | PRN

## 2024-09-15 MED ORDER — ASPIRIN 81 MG PO TBEC
162.0000 mg | DELAYED_RELEASE_TABLET | Freq: Every day | ORAL | Status: DC
  Administered 2024-09-15 – 2024-09-16 (×2): 162 mg via ORAL
  Filled 2024-09-15 (×4): qty 2

## 2024-09-15 MED ORDER — HYDRALAZINE HCL 20 MG/ML IJ SOLN: 10.0000 mg | INTRAMUSCULAR | Status: DC | PRN

## 2024-09-15 MED ORDER — MAGNESIUM SULFATE 40 GM/1000ML IV SOLN
2.0000 g/h | INTRAVENOUS | Status: DC
  Administered 2024-09-16 – 2024-09-17 (×2): 2 g/h via INTRAVENOUS
  Filled 2024-09-15 (×3): qty 1000

## 2024-09-15 MED ORDER — LACTATED RINGERS IV SOLN: 125.0000 mL/h | INTRAVENOUS | Status: AC

## 2024-09-15 MED ORDER — VALACYCLOVIR HCL 500 MG PO TABS
500.0000 mg | ORAL_TABLET | Freq: Two times a day (BID) | ORAL | Status: DC
  Administered 2024-09-15 – 2024-09-17 (×5): 500 mg via ORAL
  Filled 2024-09-15 (×9): qty 1

## 2024-09-15 MED ORDER — DOCUSATE SODIUM 100 MG PO CAPS
100.0000 mg | ORAL_CAPSULE | Freq: Every day | ORAL | Status: DC
  Filled 2024-09-15 (×2): qty 1

## 2024-09-15 MED ORDER — ACETAMINOPHEN 325 MG PO TABS: 650.0000 mg | ORAL_TABLET | ORAL | Status: DC | PRN

## 2024-09-15 MED ORDER — LACTATED RINGERS IV BOLUS
1000.0000 mL | Freq: Once | INTRAVENOUS | Status: AC
  Administered 2024-09-15 (×2): 1000 mL via INTRAVENOUS

## 2024-09-15 MED ORDER — LEVOTHYROXINE SODIUM 25 MCG PO TABS
250.0000 ug | ORAL_TABLET | Freq: Every day | ORAL | Status: DC
  Administered 2024-09-15 – 2024-09-19 (×5): 250 ug via ORAL
  Filled 2024-09-15 (×3): qty 10
  Filled 2024-09-15: qty 2
  Filled 2024-09-15 (×2): qty 10

## 2024-09-15 MED ORDER — LABETALOL HCL 5 MG/ML IV SOLN: 20.0000 mg | INTRAVENOUS | Status: DC | PRN

## 2024-09-15 MED ORDER — LACTATED RINGERS IV SOLN: INTRAVENOUS | Status: AC

## 2024-09-15 NOTE — Assessment & Plan Note (Signed)
 Noted on last u/s Normal 2 hour

## 2024-09-15 NOTE — Assessment & Plan Note (Signed)
 Continue Effexor.

## 2024-09-15 NOTE — Assessment & Plan Note (Signed)
 With LGA, normal 2 hour this pregnancy Normal glucose on CMP x 2 this hospitalization

## 2024-09-15 NOTE — Consult Note (Signed)
 MFM Consult Note  Barbara Moore is a gravida 2 para 1 currently at 35 weeks and 2 days.  She presented to the hospital earlier this morning after she woke up and felt like she was swimming and her head was buzzing.  She also noted elevated blood pressures at home.  Her PIH labs on admission showed elevated liver function tests with an AST of 46 and an ALT of 107.  The patient was also noted to have mildly elevated blood pressures in the 130s to 140s over 70s to 90s range.  Her P/C ratio was 0.11.  Her repeat LFTs a few hours later showed an AST of 44 and ALT of 97.    Currently, the patient is asymptomatic.  She denies any signs or symptoms of preeclampsia.  Her past pregnancy history includes an induction at term due to preeclampsia.    She had a growth ultrasound last week that showed an EFW of 6 pounds 15 ounces (greater than 99th percentile) with normal amniotic fluid.  The patient was advised that the only treatment for preeclampsia is delivery.  As her liver function tests are elevated along with her mildly elevated blood pressures, she meets the criteria for the diagnosis of preeclampsia and delivery is recommended at her current gestational age.  The small risk of a NICU admission for delivery at 35+ weeks was discussed.  The patient is agreeable to proceeding with delivery as she does not want to take any risks of worsening preeclampsia.  She is willing to accept the small risk of NICU admission for delivery at her current gestational age.    The patient should be scheduled for induction once the NICU has room.  A total of 60 minutes was spent counseling the patient, documenting, reviewing her chart, and coordinating the care for this patient.

## 2024-09-15 NOTE — Progress Notes (Signed)
 Patient ID: Barbara Moore, female   DOB: [birth year], 33 y.o.   MRN: [id number] FACULTY PRACTICE ANTEPARTUM(COMPREHENSIVE) NOTE  Preston Weill is a 33 y.o. G2P1001 at [redacted]w[redacted]d by best clinical estimate who is admitted for [facility] with abnormal labs.   Fetal presentation is cephalic. Length of Stay:  0  Days  ASSESSMENT/PLAN: Elevated LFTs Remain mostly unchanged. Given weird presentation, mildly elevated BP, have consulted MFM for guidance on care.  Post-operative hypothyroidism Repeat TSH  History of pre-eclampsia With severe features, by headache with mild range blood pressures, Received magnesium  intrapartum and x 24 hours pp last pregnancy  History of postpartum hemorrhage Required blood transfusion with last delivery  HSV-2 infection Begin Valtrex  suppression  Gestational hypertension Has had several BP's in the mild range No proteinuria Abnormal LFT's and Serum Cr. MFM referral today  LGA (large for gestational age) fetus affecting mother, antepartum Noted on last u/s Normal 2 hour  Polyhydramnios With LGA, normal 2 hour this pregnancy Normal glucose on CMP x 2 this hospitalization  Unwanted fertility BTL consent signed on 08/03/2024  Obesity in pregnancy, antepartum, third trimester May make BTL better for interval, eval pp.  Anxiety Continue Effexor    Subjective: Feels ok, no headache right now Patient reports the fetal movement as active. Patient reports uterine contraction  activity as none. Patient reports  vaginal bleeding as none. Patient describes fluid per vagina as None.  Vitals:  Blood pressure 135/71, pulse (!) 107, temperature 98.2 F (36.8 C), temperature source Oral, resp. rate 17, height 5' (1.524 m), weight (!) 155.9 kg, last menstrual period 01/12/2024, SpO2 99%, currently breastfeeding. Physical Examination:  General appearance - alert, well appearing, and in no distress Chest - normal effort Abdomen - gravid, non-tender Fundal Height:   size equals dates Extremities: Homans sign is negative, no sign of DVT  Membranes:intact  Fetal Monitoring:  Baseline: 135 bpm, Variability: Good {> 6 bpm), Accelerations: Reactive, and Decelerations: Absent  Labs:  Results for orders placed or performed during the hospital encounter of 09/15/24 (from the past 24 hours)  Urinalysis, Routine w reflex microscopic -Urine, Clean Catch   Collection Time: 09/15/24  1:46 AM  Result Value Ref Range   Color, Urine AMBER (A) YELLOW   APPearance HAZY (A) CLEAR   Specific Gravity, Urine 1.029 1.005 - 1.030   pH 5.0 5.0 - 8.0   Glucose, UA NEGATIVE NEGATIVE mg/dL   Hgb urine dipstick NEGATIVE NEGATIVE   Bilirubin Urine NEGATIVE NEGATIVE   Ketones, ur 20 (A) NEGATIVE mg/dL   Protein, ur 30 (A) NEGATIVE mg/dL   Nitrite NEGATIVE NEGATIVE   Leukocytes,Ua NEGATIVE NEGATIVE   RBC / HPF 0-5 0 - 5 RBC/hpf   WBC, UA 0-5 0 - 5 WBC/hpf   Bacteria, UA NONE SEEN NONE SEEN   Squamous Epithelial / HPF 0-5 0 - 5 /HPF   Mucus PRESENT   Protein / creatinine ratio, urine   Collection Time: 09/15/24  1:46 AM  Result Value Ref Range   Creatinine, Urine 284 mg/dL   Total Protein, Urine 31 mg/dL   Protein Creatinine Ratio 0.11 0.00 - 0.15 mg/mg[Cre]  CBC   Collection Time: 09/15/24  2:25 AM  Result Value Ref Range   WBC 7.7 4.0 - 10.5 K/uL   RBC 4.39 3.87 - 5.11 MIL/uL   Hemoglobin 11.7 (L) 12.0 - 15.0 g/dL   HCT 65.6 (L) 63.9 - 53.9 %   MCV 78.1 (L) 80.0 - 100.0 fL   MCH 26.7 26.0 - 34.0 pg  MCHC 34.1 30.0 - 36.0 g/dL   RDW 85.1 88.4 - 84.4 %   Platelets 317 150 - 400 K/uL   nRBC 0.0 0.0 - 0.2 %  Comprehensive metabolic panel with GFR   Collection Time: 09/15/24  2:25 AM  Result Value Ref Range   Sodium 135 135 - 145 mmol/L   Potassium 3.8 3.5 - 5.1 mmol/L   Chloride 106 98 - 111 mmol/L   CO2 19 (L) 22 - 32 mmol/L   Glucose, Bld 106 (H) 70 - 99 mg/dL   BUN 11 6 - 20 mg/dL   Creatinine, Ser 9.18 0.44 - 1.00 mg/dL   Calcium 9.1 8.9 - 89.6 mg/dL    Total Protein 7.0 6.5 - 8.1 g/dL   Albumin 2.8 (L) 3.5 - 5.0 g/dL   AST 46 (H) 15 - 41 U/L   ALT 107 (H) 0 - 44 U/L   Alkaline Phosphatase 136 (H) 38 - 126 U/L   Total Bilirubin 0.7 0.0 - 1.2 mg/dL   GFR, Estimated >39 >39 mL/min   Anion gap 10 5 - 15  Type and screen [facility]   Collection Time: 09/15/24  2:25 AM  Result Value Ref Range   ABO/RH(D) A POS    Antibody Screen NEG    Sample Expiration      09/18/2024,2359 Performed at [facility], [address], [place], [place] [address]   Comprehensive metabolic panel   Collection Time: 09/15/24  6:40 AM  Result Value Ref Range   Sodium 134 (L) 135 - 145 mmol/L   Potassium 3.7 3.5 - 5.1 mmol/L   Chloride 104 98 - 111 mmol/L   CO2 21 (L) 22 - 32 mmol/L   Glucose, Bld 91 70 - 99 mg/dL   BUN 10 6 - 20 mg/dL   Creatinine, Ser 9.19 0.44 - 1.00 mg/dL   Calcium 8.7 (L) 8.9 - 10.3 mg/dL   Total Protein 6.3 (L) 6.5 - 8.1 g/dL   Albumin 2.5 (L) 3.5 - 5.0 g/dL   AST 44 (H) 15 - 41 U/L   ALT 97 (H) 0 - 44 U/L   Alkaline Phosphatase 133 (H) 38 - 126 U/L   Total Bilirubin 0.7 0.0 - 1.2 mg/dL   GFR, Estimated >39 >39 mL/min   Anion gap 9 5 - 15  CBC   Collection Time: 09/15/24  6:40 AM  Result Value Ref Range   WBC 7.1 4.0 - 10.5 K/uL   RBC 3.99 3.87 - 5.11 MIL/uL   Hemoglobin 10.9 (L) 12.0 - 15.0 g/dL   HCT 68.9 (L) 63.9 - 53.9 %   MCV 77.7 (L) 80.0 - 100.0 fL   MCH 27.3 26.0 - 34.0 pg   MCHC 35.2 30.0 - 36.0 g/dL   RDW 85.1 88.4 - 84.4 %   Platelets 283 150 - 400 K/uL   nRBC 0.0 0.0 - 0.2 %    Imaging Studies:    US  MFM OB FOLLOW UP Result Date: 09/07/2024 ----------------------------------------------------------------------  OBSTETRICS REPORT                       (Signed Final 09/07/2024 04:53 pm) ---------------------------------------------------------------------- Patient Info  ID #:       [id number]                          D.O.B.:  [birth year] (32 yrs)(F)  Name:       Barbara Moore  Visit Date: 09/07/2024 02:12 pm ---------------------------------------------------------------------- Performed By  Attending:        Delora Smaller DO       Ref. Address:     [facility]  Performed By:     Erminio Gentry            Secondary Phy.:   [facility] MAU/Triage                    RDMS  Referred By:      NIDIA               Location[facility]                                 [facility] ---------------------------------------------------------------------- Orders  #  Description                           Code        Ordered By  1  US  MFM OB FOLLOW UP                   76816.01    RAVI SHANKAR  2  US  MFM FETAL BPP WO NON               76819.01    RAVI [facility]     STRESS ----------------------------------------------------------------------  #  Order #                     Accession #                Episode #  1  [id number]                   [id number]                 [id number]  2  [id number]                   [id number]                 [id number] ---------------------------------------------------------------------- Indications  Obesity complicating pregnancy, third          O99.213  trimester (BMI 55)  Large for gestational age fetus affecting      O36.60X0  management of mother  Polyhydramnios, third trimester, antepartum    O40.3XX0  condition or complication, singleton fetus  Hypothyroid (Thyroidectomy, hx thyroid          O99.280 E03.9  cancer - on Synthroid )  Sickle cell trait                              Z86.2  Poor obstetric history: Previous severe        O09.299  preeclampsia  LR NIPS - Female, Neg AFP  [redacted] weeks gestation of pregnancy                Z3A.34 ---------------------------------------------------------------------- Fetal Evaluation  Num Of Fetuses:         1  Fetal Heart Rate(bpm):  145  Cardiac Activity:       Observed   Presentation:           Cephalic  Placenta:               Anterior  P. Cord Insertion:      Previously seen  Amniotic Fluid  AFI FV:      Subjectively upper-normal  AFI Sum(cm)     %Tile       Largest Pocket(cm)  23.98           91          7.74  RUQ(cm)       RLQ(cm)       LUQ(cm)        LLQ(cm)  6.33          3.36          7.74           6.55 ---------------------------------------------------------------------- Biophysical Evaluation  Amniotic F.V:   Within normal limits       F. Tone:        Observed  F. Movement:    Observed                   Score:          8/8  F. Breathing:   Observed ---------------------------------------------------------------------- Biometry  BPD:      92.7  mm     G. Age:  37w 5d       > 99  %    CI:        80.34   %    70 - 86                                                          FL/HC:      20.6   %    19.4 - 21.8  HC:      326.7  mm     G. Age:  37w 0d         85  %    HC/AC:      0.95        0.96 - 1.11  AC:       343   mm     G. Age:  38w 1d       > 99  %    FL/BPD:     72.7   %    71 - 87  FL:       67.4  mm     G. Age:  34w 5d         55  %    FL/AC:      19.7   %  20 - 24  LV:        2.9  mm  Est. FW:    3143  gm    6 lb 15 oz    > 99  %  Est. FW at 39 Wks:       4482  gm    9 lb 14 oz ---------------------------------------------------------------------- OB History  Gravidity:    2         Term:   1        Prem:   0        SAB:   0  TOP:          0       Ectopic:  0        Living: 1 ---------------------------------------------------------------------- Gestational Age  LMP:           34w 1d        Date:  01/12/24                   EDD:   10/18/24  U/S Today:     36w 6d                                        EDD:   09/29/24  Best:          34w 1d     Det. By:  LMP  (01/12/24)          EDD:   10/18/24 ---------------------------------------------------------------------- Anatomy  Cranium:               Previously seen        Aortic Arch:            Previously seen  Cavum:                  Previously seen        Ductal Arch:            Not well visualized  Ventricles:            Appears normal         Diaphragm:              Appears normal  Choroid Plexus:        Previously seen        Stomach:                Appears normal, left                                                                        sided  Cerebellum:            Previously seen        Abdomen:                Previously seen  Posterior Fossa:       Previously seen        Abdominal Wall:         Previously seen  Face:                  Orbits and profile  Cord Vessels:           Previously seen                         previously seen  Lips:                  Previously seen        Kidneys:                Appear normal  Thoracic:              Previously seen        Bladder:                Appears normal  Heart:                 Appears normal         Spine:                  Previously seen                         (4CH, axis, and                         situs)  RVOT:                  Previously seen        Upper Extremities:      Previously seen  LVOT:                  Not well visualized    Lower Extremities:      Previously seen ---------------------------------------------------------------------- Comments  Sonographic findings  Single intrauterine pregnancy at 34w 1d.  Fetal cardiac activity: Observed.  Presentation: Cephalic.  Interval fetal anatomy appears normal.  Fetal biometry shows the estimated fetal weight of 6 lb 15 oz,  3143g (> 99%).  Amniotic fluid: Subjectively upper-normal. AFI: 23.98 cm.  MVP: 7.74 cm.  Placenta: Anterior.  BPP: 8/8.  There are limitations of prenatal ultrasound such as the  inability to detect certain abnormalities due to poor  visualization. Various factors such as fetal position,  gestational age and maternal body habitus may increase the  difficulty in visualizing the fetal anatomy.  Recommendations  - See Epic note for assessment and plan of care. Any  referring office that does not  utilize Epic will recieve a copy of  today's consult note via fax. Please contact our office with  any concerns. ----------------------------------------------------------------------                  Delora Smaller, DO Electronically Signed Final Report   09/07/2024 04:53 pm ----------------------------------------------------------------------   US  MFM FETAL BPP WO NON STRESS Result Date: 09/07/2024 ----------------------------------------------------------------------  OBSTETRICS REPORT                       (Signed Final 09/07/2024 04:53 pm) ---------------------------------------------------------------------- Patient Info  ID #:       [id number]                          D.O.B.:  [birth year] (32 yrs)(F)  Name:       Barbara Moore                   Visit Date: 09/07/2024 02:12 pm ---------------------------------------------------------------------- Performed By  Attending:        Delora Smaller DO       Ref. Address:     [facility]  Performed By:     Erminio Gentry            Secondary Phy.:   [facility] MAU/Triage                    RDMS  Referred By:      NIDIA               Location:         [facility]                                 [facility] ---------------------------------------------------------------------- Orders  #  Description                           Code        Ordered By  1  US  MFM OB FOLLOW UP                   76816.01    RAVI SHANKAR  2  US  MFM FETAL BPP WO NON               76819.01    RAVI [facility]     STRESS ----------------------------------------------------------------------  #  Order #                     Accession #                Episode #  1  [id number]                   [id number]                 [id number]  2  [id number]                   [id number]                 [id number]  ---------------------------------------------------------------------- Indications  Obesity complicating pregnancy, third          O99.213  trimester (BMI 55)  Large for gestational age fetus affecting      O36.60X0  management of mother  Polyhydramnios, third trimester, antepartum    O40.3XX0  condition or complication, singleton fetus  Hypothyroid (Thyroidectomy, hx thyroid          O99.280 E03.9  cancer - on Synthroid )  Sickle cell trait  Z86.2  Poor obstetric history: Previous severe        O09.299  preeclampsia  LR NIPS - Female, Neg AFP  [redacted] weeks gestation of pregnancy                Z3A.34 ---------------------------------------------------------------------- Fetal Evaluation  Num Of Fetuses:         1  Fetal Heart Rate(bpm):  145  Cardiac Activity:       Observed  Presentation:           Cephalic  Placenta:               Anterior  P. Cord Insertion:      Previously seen  Amniotic Fluid  AFI FV:      Subjectively upper-normal  AFI Sum(cm)     %Tile       Largest Pocket(cm)  23.98           91          7.74  RUQ(cm)       RLQ(cm)       LUQ(cm)        LLQ(cm)  6.33          3.36          7.74           6.55 ---------------------------------------------------------------------- Biophysical Evaluation  Amniotic F.V:   Within normal limits       F. Tone:        Observed  F. Movement:    Observed                   Score:          8/8  F. Breathing:   Observed ---------------------------------------------------------------------- Biometry  BPD:      92.7  mm     G. Age:  37w 5d       > 99  %    CI:        80.34   %    70 - 86                                                          FL/HC:      20.6   %    19.4 - 21.8  HC:      326.7  mm     G. Age:  37w 0d         85  %    HC/AC:      0.95        0.96 - 1.11  AC:       343   mm     G. Age:  38w 1d       > 99  %    FL/BPD:     72.7   %    71 - 87  FL:       67.4  mm     G. Age:  34w 5d         55  %    FL/AC:      19.7   %    20 - 24  LV:         2.9  mm  Est. FW:    3143  gm    6 lb 15 oz    >  99  %  Est. FW at 39 Wks:       4482  gm    9 lb 14 oz ---------------------------------------------------------------------- OB History  Gravidity:    2         Term:   1        Prem:   0        SAB:   0  TOP:          0       Ectopic:  0        Living: 1 ---------------------------------------------------------------------- Gestational Age  LMP:           34w 1d        Date:  01/12/24                   EDD:   10/18/24  U/S Today:     36w 6d                                        EDD:   09/29/24  Best:          34w 1d     Det. By:  LMP  (01/12/24)          EDD:   10/18/24 ---------------------------------------------------------------------- Anatomy  Cranium:               Previously seen        Aortic Arch:            Previously seen  Cavum:                 Previously seen        Ductal Arch:            Not well visualized  Ventricles:            Appears normal         Diaphragm:              Appears normal  Choroid Plexus:        Previously seen        Stomach:                Appears normal, left                                                                        sided  Cerebellum:            Previously seen        Abdomen:                Previously seen  Posterior Fossa:       Previously seen        Abdominal Wall:         Previously seen  Face:                  Orbits and profile     Cord Vessels:           Previously seen  previously seen  Lips:                  Previously seen        Kidneys:                Appear normal  Thoracic:              Previously seen        Bladder:                Appears normal  Heart:                 Appears normal         Spine:                  Previously seen                         (4CH, axis, and                         situs)  RVOT:                  Previously seen        Upper Extremities:      Previously seen  LVOT:                  Not well visualized    Lower Extremities:      Previously seen  ---------------------------------------------------------------------- Comments  Sonographic findings  Single intrauterine pregnancy at 34w 1d.  Fetal cardiac activity: Observed.  Presentation: Cephalic.  Interval fetal anatomy appears normal.  Fetal biometry shows the estimated fetal weight of 6 lb 15 oz,  3143g (> 99%).  Amniotic fluid: Subjectively upper-normal. AFI: 23.98 cm.  MVP: 7.74 cm.  Placenta: Anterior.  BPP: 8/8.  There are limitations of prenatal ultrasound such as the  inability to detect certain abnormalities due to poor  visualization. Various factors such as fetal position,  gestational age and maternal body habitus may increase the  difficulty in visualizing the fetal anatomy.  Recommendations  - See Epic note for assessment and plan of care. Any  referring office that does not utilize Epic will recieve a copy of  today's consult note via fax. Please contact our office with  any concerns. ----------------------------------------------------------------------                  Delora Smaller, DO Electronically Signed Final Report   09/07/2024 04:53 pm ----------------------------------------------------------------------     Medications:  Scheduled  aspirin  EC  162 mg Oral Daily   docusate sodium   100 mg Oral Daily   levothyroxine   250 mcg Oral QAC breakfast   prenatal multivitamin  1 tablet Oral Q1200   valACYclovir   500 mg Oral BID   venlafaxine  XR  150 mg Oral Daily   I have reviewed the patient's current medications.   Glenys GORMAN Birk, MD 09/15/2024,10:06 AM

## 2024-09-15 NOTE — Assessment & Plan Note (Signed)
 Begin Valtrex  suppression

## 2024-09-15 NOTE — MAU Provider Note (Signed)
 History     [id number]  Arrival date and time: 09/15/24 0120    Chief Complaint  Patient presents with   Hypertension     HPI Barbara Moore is a 33 y.o. at [redacted]w[redacted]d with PMHx notable for post op hypothyroidism, anxiety, obesity, who presents for elevated BP.   Patient reports she woke up feeling dizzy and strange, tried to go back to sleep but couldn't When she got up she checked her BP and was getting mild range BP's (see triage note for exact values) At present feels a bit tired but otherwise well Denies headache, vision changes, chest pain, SOB, RUQ pain Endorses good fetal movement No leaking fluid, vaginal bleeding, or contractions  A/Positive/-- (06/24 1600)  OB History     Gravida  2   Para  1   Term  1   Preterm      AB      Living  1      SAB      IAB      Ectopic      Multiple  0   Live Births  1           Past Medical History:  Diagnosis Date   Abnormal Pap smear of cervix 11/11/2018   ASCUS +HPV 10/2018     Cancer (HCC)    thyroid    Encounter for screening for other metabolic disorders 03/23/2024   Herpes    Lactose intolerance    Morbid obesity (HCC) 12/28/2021   Obesity    Possible exposure to STD 02/21/2021   Postoperative hypothyroidism 05/13/2022   Postpartum anemia 05/03/2019   Postpartum hemorrhage 05/03/2019   Seasonal allergies    Severe preeclampsia, delivered 05/01/2019   Sickle cell trait     Past Surgical History:  Procedure Laterality Date   NO PAST SURGERIES     THYROIDECTOMY      Family History  Problem Relation Age of Onset   Diabetes Mother    Heart failure Mother    Heart failure Father    Learning disabilities Sister     Social History   Socioeconomic History   Marital status: Single    Spouse name: Not on file   Number of children: Not on file   Years of education: Not on file   Highest education level: Not on file  Occupational History   Occupation: Tech support    Employer: [employer]   Tobacco Use   Smoking status: Never   Smokeless tobacco: Never  Vaping Use   Vaping status: Never Used  Substance and Sexual Activity   Alcohol use: Not Currently   Drug use: No   Sexual activity: Yes    Birth control/protection: None  Other Topics Concern   Not on file  Social History Narrative   Not on file   Social Drivers of Health   Financial Resource Strain: Not on file  Food Insecurity: Low Risk (08/27/2024)   Received from [facility]   Hunger Vital Sign    Within the past 12 months, you worried that your food would run out before you got money to buy more: Never true    Within the past 12 months, the food you bought just didn't last and you didn't have money to get more. : Never true  Transportation Needs: No Transportation Needs (08/27/2024)   Received from [facility]    In the past 12 months, has lack of reliable transportation kept you from medical appointments, meetings,  work or from getting things needed for daily living? : No  Physical Activity: Not on file  Stress: Not on file  Social Connections: Unknown (11/07/2022)   [facility]   Social Network    Social Network: Not on file  Intimate Partner Violence: Unknown (11/07/2022)   Received from [facility]   HITS    Physically Hurt: Not on file    Insult or Talk Down To: Not on file    Threaten Physical Harm: Not on file    Scream or Curse: Not on file    Allergies  Allergen Reactions   Lactose Intolerance (Gi) Diarrhea and Nausea And Vomiting   Other Other (See Comments)    n/a  Lactose intolerant    No current facility-administered medications on file prior to encounter.   Current Outpatient Medications on File Prior to Encounter  Medication Sig Dispense Refill   aspirin  EC 81 MG tablet Take 2 tablets (162 mg total) by mouth daily. Start taking when you are [redacted] weeks pregnant for rest of pregnancy for prevention of preeclampsia 300 tablet 2   ferrous sulfate  325 (65 FE) MG EC tablet Take 325 mg by mouth 3 (three) times daily with meals.     levothyroxine  (SYNTHROID ) 25 MCG tablet Take 1 tablet (25 mcg total) by mouth daily before breakfast. In addition to 200 mcg daily to make 225 mcg daily. 30 tablet 2   Prenatal Vit-Fe Fumarate-FA (MULTIVITAMIN-PRENATAL) 27-0.8 MG TABS tablet Take 1 tablet by mouth daily at 12 noon.     venlafaxine  XR (EFFEXOR -XR) 150 MG 24 hr capsule Take 1 capsule (150 mg total) by mouth daily. 30 capsule 1     ROS Pertinent positives and negative per HPI, all others reviewed and negative  Physical Exam   BP (!) 133/92   Pulse 99   Temp 98 F (36.7 C) (Oral)   Resp 12   Ht 5' (1.524 m)   Wt (!) 155.9 kg   LMP 01/12/2024 (Approximate)   SpO2 100%   BMI 67.14 kg/m   Patient Vitals for the past 24 hrs:  BP Temp Temp src Pulse Resp SpO2 Height Weight  09/15/24 0331 (!) 133/92 -- -- 99 -- -- -- --  09/15/24 0300 128/80 -- -- 84 -- 100 % -- --  09/15/24 0246 128/79 -- -- 83 -- -- -- --  09/15/24 0231 134/82 -- -- 84 -- -- -- --  09/15/24 0218 123/71 -- -- 83 -- -- -- --  09/15/24 0200 (!) 134/91 -- -- (!) 111 -- -- -- --  09/15/24 0133 125/76 98 F (36.7 C) Oral 98 12 -- 5' (1.524 m) (!) 155.9 kg    Physical Exam Vitals reviewed.  Constitutional:      General: She is not in acute distress.    Appearance: She is well-developed. She is not diaphoretic.  Eyes:     General: No scleral icterus. Pulmonary:     Effort: Pulmonary effort is normal. No respiratory distress.  Skin:    General: Skin is warm and dry.  Neurological:     Mental Status: She is alert.     Coordination: Coordination normal.      Cervical Exam    Bedside Ultrasound Not performed.  My interpretation: n/a  FHT Baseline: 135 bpm Variability: Good {> 6 bpm) Accelerations: Reactive Decelerations: Absent Uterine activity: irritability with some contractions Cat: I  Labs Results for orders placed or performed during the hospital  encounter of 09/15/24 (  from the past 24 hours)  Urinalysis, Routine w reflex microscopic -Urine, Clean Catch     Status: Abnormal   Collection Time: 09/15/24  1:46 AM  Result Value Ref Range   Color, Urine AMBER (A) YELLOW   APPearance HAZY (A) CLEAR   Specific Gravity, Urine 1.029 1.005 - 1.030   pH 5.0 5.0 - 8.0   Glucose, UA NEGATIVE NEGATIVE mg/dL   Hgb urine dipstick NEGATIVE NEGATIVE   Bilirubin Urine NEGATIVE NEGATIVE   Ketones, ur 20 (A) NEGATIVE mg/dL   Protein, ur 30 (A) NEGATIVE mg/dL   Nitrite NEGATIVE NEGATIVE   Leukocytes,Ua NEGATIVE NEGATIVE   RBC / HPF 0-5 0 - 5 RBC/hpf   WBC, UA 0-5 0 - 5 WBC/hpf   Bacteria, UA NONE SEEN NONE SEEN   Squamous Epithelial / HPF 0-5 0 - 5 /HPF   Mucus PRESENT   Protein / creatinine ratio, urine     Status: None   Collection Time: 09/15/24  1:46 AM  Result Value Ref Range   Creatinine, Urine 284 mg/dL   Total Protein, Urine 31 mg/dL   Protein Creatinine Ratio 0.11 0.00 - 0.15 mg/mg[Cre]  CBC     Status: Abnormal   Collection Time: 09/15/24  2:25 AM  Result Value Ref Range   WBC 7.7 4.0 - 10.5 K/uL   RBC 4.39 3.87 - 5.11 MIL/uL   Hemoglobin 11.7 (L) 12.0 - 15.0 g/dL   HCT 65.6 (L) 63.9 - 53.9 %   MCV 78.1 (L) 80.0 - 100.0 fL   MCH 26.7 26.0 - 34.0 pg   MCHC 34.1 30.0 - 36.0 g/dL   RDW 85.1 88.4 - 84.4 %   Platelets 317 150 - 400 K/uL   nRBC 0.0 0.0 - 0.2 %  Comprehensive metabolic panel with GFR     Status: Abnormal   Collection Time: 09/15/24  2:25 AM  Result Value Ref Range   Sodium 135 135 - 145 mmol/L   Potassium 3.8 3.5 - 5.1 mmol/L   Chloride 106 98 - 111 mmol/L   CO2 19 (L) 22 - 32 mmol/L   Glucose, Bld 106 (H) 70 - 99 mg/dL   BUN 11 6 - 20 mg/dL   Creatinine, Ser 9.18 0.44 - 1.00 mg/dL   Calcium 9.1 8.9 - 89.6 mg/dL   Total Protein 7.0 6.5 - 8.1 g/dL   Albumin 2.8 (L) 3.5 - 5.0 g/dL   AST 46 (H) 15 - 41 U/L   ALT 107 (H) 0 - 44 U/L   Alkaline Phosphatase 136 (H) 38 - 126 U/L   Total Bilirubin 0.7 0.0 - 1.2  mg/dL   GFR, Estimated >39 >39 mL/min   Anion gap 10 5 - 15    Imaging No results found.  MAU Course  Procedures Lab Orders         Urinalysis, Routine w reflex microscopic -Urine, Clean Catch         CBC         Comprehensive metabolic panel with GFR         Protein / creatinine ratio, urine    Meds ordered this encounter  Medications   lactated ringers  bolus 1,000 mL   Imaging Orders  No imaging studies ordered today    MDM High (Level 5)  Assessment and Plan  #Elevated LFTs #[redacted] weeks gestation of pregnancy Mostly normotensive and asymptomatic but unexpectedly found to have significant LFT elevations without any other lab abnormalities, though Cr is creeping up. May  be brewing atypical HELLP, discussed obs and repeat labs in AM with possibly delivery if continues to worsen, she is amenable.   #FWB FHT Cat I NST: Reactive   Dispo: admit to antepartum unit    Donnice CHRISTELLA Carolus, MD/MPH 09/15/24 3:59 AM

## 2024-09-15 NOTE — Assessment & Plan Note (Signed)
 Has had several BP's in the mild range No proteinuria Abnormal LFT's and Serum Cr. MFM referral today

## 2024-09-15 NOTE — Assessment & Plan Note (Signed)
 Required blood transfusion with last delivery

## 2024-09-15 NOTE — Assessment & Plan Note (Signed)
 Remain mostly unchanged. Given weird presentation, mildly elevated BP, have consulted MFM for guidance on care.

## 2024-09-15 NOTE — Assessment & Plan Note (Signed)
 May make BTL better for interval, eval pp.

## 2024-09-15 NOTE — Assessment & Plan Note (Addendum)
 With severe features, by headache with mild range blood pressures, Received magnesium  intrapartum and x 24 hours pp last pregnancy

## 2024-09-15 NOTE — MAU Note (Signed)
 Pt says was asleep- when she woke  at 2300- her head felt like it was buzzing. She checked her BP- 143/85 , 126/96. Then showered - then BP 136/95  , called nurse line- rechecked - 130/95.  Then at 0100- 136/98.  Now- no buzzing , no H/A, no vision problems , no epigastric pain. Feels baby moving.  [facility]- clinic

## 2024-09-15 NOTE — Assessment & Plan Note (Signed)
 Repeat TSH.

## 2024-09-15 NOTE — Progress Notes (Signed)
 Patient ID: Barbara Moore, female   DOB: 06/28/1991, 33 y.o.   MRN: [id number] FACULTY PRACTICE ANTEPARTUM(COMPREHENSIVE) NOTE  Barbara Moore is a 33 y.o. G2P1001 at [redacted]w[redacted]d by best clinical estimate who is admitted for  [facility] with abnormal labs  Fetal presentation is cephalic. Length of Stay:  0  Days  Subjective:  Patient reports the fetal movement as active. Patient reports uterine contraction  activity as none. Patient reports  vaginal bleeding as none. Patient describes fluid per vagina as None.  Vitals:  Blood pressure 124/68, pulse 99, temperature 98.5 F (36.9 C), temperature source Oral, resp. rate 17, height 5' (1.524 m), weight (!) 155.9 kg, last menstrual period 01/12/2024, SpO2 99%, currently breastfeeding. Physical Examination:  General appearance - alert, well appearing, and in no distress Heart - normal rate and regular rhythm Abdomen - soft, nontender, nondistended Fundal Height:  size equals dates Extremities: extremities normal, atraumatic, no cyanosis or edema and Homans sign is negative, no sign of DVT  Membranes:intact  Fetal Monitoring:   Fetal Heart Rate A   Mode External (P) filed at 09/15/2024 1710  Baseline Rate (A) 145 bpm filed at 09/15/2024 1554  Variability 6-25 BPM filed at 09/15/2024 1554  Accelerations 15 x 15 filed at 09/15/2024 1554  Decelerations None filed at 09/15/2024 1554  Multiple birth? N filed at 09/15/2024 1530    Labs:  Results for orders placed or performed during the hospital encounter of 09/15/24 (from the past 24 hours)  Urinalysis, Routine w reflex microscopic -Urine, Clean Catch   Collection Time: 09/15/24  1:46 AM  Result Value Ref Range   Color, Urine AMBER (A) YELLOW   APPearance HAZY (A) CLEAR   Specific Gravity, Urine 1.029 1.005 - 1.030   pH 5.0 5.0 - 8.0   Glucose, UA NEGATIVE NEGATIVE mg/dL   Hgb urine dipstick NEGATIVE NEGATIVE   Bilirubin Urine NEGATIVE NEGATIVE   Ketones, ur 20 (A) NEGATIVE mg/dL   Protein, ur  30 (A) NEGATIVE mg/dL   Nitrite NEGATIVE NEGATIVE   Leukocytes,Ua NEGATIVE NEGATIVE   RBC / HPF 0-5 0 - 5 RBC/hpf   WBC, UA 0-5 0 - 5 WBC/hpf   Bacteria, UA NONE SEEN NONE SEEN   Squamous Epithelial / HPF 0-5 0 - 5 /HPF   Mucus PRESENT   Protein / creatinine ratio, urine   Collection Time: 09/15/24  1:46 AM  Result Value Ref Range   Creatinine, Urine 284 mg/dL   Total Protein, Urine 31 mg/dL   Protein Creatinine Ratio 0.11 0.00 - 0.15 mg/mg[Cre]  CBC   Collection Time: 09/15/24  2:25 AM  Result Value Ref Range   WBC 7.7 4.0 - 10.5 K/uL   RBC 4.39 3.87 - 5.11 MIL/uL   Hemoglobin 11.7 (L) 12.0 - 15.0 g/dL   HCT 65.6 (L) 63.9 - 53.9 %   MCV 78.1 (L) 80.0 - 100.0 fL   MCH 26.7 26.0 - 34.0 pg   MCHC 34.1 30.0 - 36.0 g/dL   RDW 85.1 88.4 - 84.4 %   Platelets 317 150 - 400 K/uL   nRBC 0.0 0.0 - 0.2 %  Comprehensive metabolic panel with GFR   Collection Time: 09/15/24  2:25 AM  Result Value Ref Range   Sodium 135 135 - 145 mmol/L   Potassium 3.8 3.5 - 5.1 mmol/L   Chloride 106 98 - 111 mmol/L   CO2 19 (L) 22 - 32 mmol/L   Glucose, Bld 106 (H) 70 - 99 mg/dL   BUN 11  6 - 20 mg/dL   Creatinine, Ser 9.18 0.44 - 1.00 mg/dL   Calcium 9.1 8.9 - 89.6 mg/dL   Total Protein 7.0 6.5 - 8.1 g/dL   Albumin 2.8 (L) 3.5 - 5.0 g/dL   AST 46 (H) 15 - 41 U/L   ALT 107 (H) 0 - 44 U/L   Alkaline Phosphatase 136 (H) 38 - 126 U/L   Total Bilirubin 0.7 0.0 - 1.2 mg/dL   GFR, Estimated >39 >39 mL/min   Anion gap 10 5 - 15  Type and screen [facility]   Collection Time: 09/15/24  2:25 AM  Result Value Ref Range   ABO/RH(D) A POS    Antibody Screen NEG    Sample Expiration      09/18/2024,2359 Performed at [facility], [address] [place], [place] [address]   GC/Chlamydia probe amp ([place])not at [facility]   Collection Time: 09/15/24  4:06 AM  Result Value Ref Range   Neisseria Gonorrhea Negative    Chlamydia Negative    Comment Normal Reference Ranger Chlamydia -  Negative    Comment      Normal Reference Range Neisseria Gonorrhea - Negative  Comprehensive metabolic panel   Collection Time: 09/15/24  6:40 AM  Result Value Ref Range   Sodium 134 (L) 135 - 145 mmol/L   Potassium 3.7 3.5 - 5.1 mmol/L   Chloride 104 98 - 111 mmol/L   CO2 21 (L) 22 - 32 mmol/L   Glucose, Bld 91 70 - 99 mg/dL   BUN 10 6 - 20 mg/dL   Creatinine, Ser 9.19 0.44 - 1.00 mg/dL   Calcium 8.7 (L) 8.9 - 10.3 mg/dL   Total Protein 6.3 (L) 6.5 - 8.1 g/dL   Albumin 2.5 (L) 3.5 - 5.0 g/dL   AST 44 (H) 15 - 41 U/L   ALT 97 (H) 0 - 44 U/L   Alkaline Phosphatase 133 (H) 38 - 126 U/L   Total Bilirubin 0.7 0.0 - 1.2 mg/dL   GFR, Estimated >39 >39 mL/min   Anion gap 9 5 - 15  CBC   Collection Time: 09/15/24  6:40 AM  Result Value Ref Range   WBC 7.1 4.0 - 10.5 K/uL   RBC 3.99 3.87 - 5.11 MIL/uL   Hemoglobin 10.9 (L) 12.0 - 15.0 g/dL   HCT 68.9 (L) 63.9 - 53.9 %   MCV 77.7 (L) 80.0 - 100.0 fL   MCH 27.3 26.0 - 34.0 pg   MCHC 35.2 30.0 - 36.0 g/dL   RDW 85.1 88.4 - 84.4 %   Platelets 283 150 - 400 K/uL   nRBC 0.0 0.0 - 0.2 %  TSH   Collection Time: 09/15/24  6:40 AM  Result Value Ref Range   TSH 1.772 0.350 - 4.500 uIU/mL     Medications:  Scheduled  aspirin  EC  162 mg Oral Daily   docusate sodium   100 mg Oral Daily   levothyroxine   250 mcg Oral QAC breakfast   prenatal multivitamin  1 tablet Oral Q1200   valACYclovir   500 mg Oral BID   venlafaxine  XR  150 mg Oral Daily   I have reviewed the patient's current medications.  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 Ileana was consulted and plan is to deliver patient. NICU is currently full and they asked that patient be transferred for delivery  and patient agreed. [facility] was unable to accept transfer according to Dr. Bronwen. Dr. Ileana and I discussed the case and feel the patient is stable currently on Mag sulfate and we can present for IOL tomorrow after consulting NICU again. Patient was informed  Lynwood Solomons 09/15/2024,6:05 PM

## 2024-09-15 NOTE — Assessment & Plan Note (Signed)
 BTL consent signed on 08/03/2024

## 2024-09-15 NOTE — H&P (Addendum)
 FACULTY PRACTICE ANTEPARTUM ADMISSION HISTORY AND PHYSICAL NOTE  History of Present Illness: Barbara Moore is a 33 y.o. G2P1001 at [redacted]w[redacted]d admitted for elevated LFTs.    Barbara Moore is a 33 y.o. at [redacted]w[redacted]d with PMHx notable for post op hypothyroidism, anxiety, obesity, who presents for elevated BP.    Patient reports she woke up feeling dizzy and strange, tried to go back to sleep but couldn't When she got up she checked her BP and was getting mild range BP's (see triage note for exact values) At present feels a bit tired but otherwise well Denies headache, vision changes, chest pain, SOB, RUQ pain Endorses good fetal movement No leaking fluid, vaginal bleeding, or contractions  She plans on breast feeding feeding. Her contraception plan is: bilateral tubal ligation.  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    Past Medical History: Past Medical History:  Diagnosis Date   Abnormal Pap smear of cervix 11/11/2018   ASCUS +HPV 10/2018     Cancer (HCC)    thyroid    Encounter for screening for other metabolic disorders 03/23/2024   Herpes    Lactose intolerance    Morbid obesity (HCC) 12/28/2021   Obesity    Possible exposure to STD 02/21/2021   Postoperative hypothyroidism 05/13/2022   Postpartum anemia 05/03/2019   Postpartum hemorrhage 05/03/2019   Seasonal allergies    Severe preeclampsia, delivered 05/01/2019   Sickle cell trait     Past Surgical History: Past Surgical History:  Procedure Laterality Date   NO PAST SURGERIES     THYROIDECTOMY      Obstetrical History: OB History     Gravida  2    Para  1   Term  1   Preterm      AB      Living  1      SAB      IAB      Ectopic      Multiple  0   Live Births  1           Social History: Social History   Socioeconomic History   Marital status: Single    Spouse name: Not on file   Number of children: Not on file   Years of education: Not on file   Highest education level: Not on file  Occupational History   Occupation: Tech support    Employer: [employer]  Tobacco Use   Smoking status: Never   Smokeless tobacco: Never  Vaping Use   Vaping status: Never Used  Substance and Sexual Activity   Alcohol use: Not Currently   Drug use: No   Sexual activity: Yes    Birth control/protection: None  Other Topics Concern   Not on file  Social History Narrative   Not on file   Social Drivers of Health   Financial Resource Strain: Not on file  Food Insecurity: Low Risk (08/27/2024)   Received from [facility]   Hunger Vital Sign    Within the past 12 months, you worried that your food would run out before you got money to buy more: Never true    Within the past 12 months,  the food you bought just didn't last and you didn't have money to get more. : Never true  Transportation Needs: No Transportation Needs (08/27/2024)   Received from [facility]    In the past 12 months, has lack of reliable transportation kept you from medical appointments, meetings, work or from getting things needed for daily living? : No  Physical Activity: Not on file  Stress: Not on file  Social Connections: Unknown (11/07/2022)   [facility]   Social Network    Social Network: Not on file    Family History: Family History  Problem Relation Age of Onset   Diabetes Mother    Heart failure Mother    Heart failure Father    Learning disabilities Sister     Allergies: Allergies  Allergen Reactions   Lactose Intolerance (Gi) Diarrhea and Nausea And Vomiting   Other Other (See Comments)     n/a  Lactose intolerant    Medications Prior to Admission  Medication Sig Dispense Refill Last Dose/Taking   aspirin  EC 81 MG tablet Take 2 tablets (162 mg total) by mouth daily. Start taking when you are [redacted] weeks pregnant for rest of pregnancy for prevention of preeclampsia 300 tablet 2 09/14/2024   ferrous sulfate 325 (65 FE) MG EC tablet Take 325 mg by mouth 3 (three) times daily with meals.   09/14/2024   levothyroxine  (SYNTHROID ) 25 MCG tablet Take 1 tablet (25 mcg total) by mouth daily before breakfast. In addition to 200 mcg daily to make 225 mcg daily. 30 tablet 2 09/14/2024   Prenatal Vit-Fe Fumarate-FA (MULTIVITAMIN-PRENATAL) 27-0.8 MG TABS tablet Take 1 tablet by mouth daily at 12 noon.   09/14/2024   venlafaxine  XR (EFFEXOR -XR) 150 MG 24 hr capsule Take 1 capsule (150 mg total) by mouth daily. 30 capsule 1 09/14/2024     Review of Systems  All systems reviewed and negative except as stated in HPI  Physical Exam BP (!) 133/92   Pulse 99   Temp 98 F (36.7 C) (Oral)   Resp 12   Ht 5' (1.524 m)   Wt (!) 155.9 kg   LMP 01/12/2024 (Approximate)   SpO2 100%   BMI 67.14 kg/m   Physical Exam Constitutional:      General: She is not in acute distress.    Appearance: Normal appearance. She is not ill-appearing.  HENT:     Head: Atraumatic.  Eyes:     General: No scleral icterus.    Conjunctiva/sclera: Conjunctivae normal.  Pulmonary:     Effort: Pulmonary effort is normal.  Skin:    General: Skin is warm and dry.     Coloration: Skin is not jaundiced or pale.  Neurological:     Mental Status: She is alert.     Coordination: Coordination normal.  Psychiatric:        Mood and Affect: Mood normal.        Behavior: Behavior normal.     Presentation: cephalic by US  on 09/07/2024  FHT Baseline: 135 bpm Variability: Good {> 6 bpm) Accelerations: Reactive Decelerations: Absent Uterine activity: irritability with some contractions Cat: I     Labs:  Results for  orders placed or performed during the hospital encounter of 09/15/24 (from the past 24 hours)  Urinalysis, Routine w reflex microscopic -Urine, Clean Catch   Collection Time: 09/15/24  1:46 AM  Result Value Ref Range   Color, Urine AMBER (A) YELLOW   APPearance HAZY (A)  CLEAR   Specific Gravity, Urine 1.029 1.005 - 1.030   pH 5.0 5.0 - 8.0   Glucose, UA NEGATIVE NEGATIVE mg/dL   Hgb urine dipstick NEGATIVE NEGATIVE   Bilirubin Urine NEGATIVE NEGATIVE   Ketones, ur 20 (A) NEGATIVE mg/dL   Protein, ur 30 (A) NEGATIVE mg/dL   Nitrite NEGATIVE NEGATIVE   Leukocytes,Ua NEGATIVE NEGATIVE   RBC / HPF 0-5 0 - 5 RBC/hpf   WBC, UA 0-5 0 - 5 WBC/hpf   Bacteria, UA NONE SEEN NONE SEEN   Squamous Epithelial / HPF 0-5 0 - 5 /HPF   Mucus PRESENT   Protein / creatinine ratio, urine   Collection Time: 09/15/24  1:46 AM  Result Value Ref Range   Creatinine, Urine 284 mg/dL   Total Protein, Urine 31 mg/dL   Protein Creatinine Ratio 0.11 0.00 - 0.15 mg/mg[Cre]  CBC   Collection Time: 09/15/24  2:25 AM  Result Value Ref Range   WBC 7.7 4.0 - 10.5 K/uL   RBC 4.39 3.87 - 5.11 MIL/uL   Hemoglobin 11.7 (L) 12.0 - 15.0 g/dL   HCT 65.6 (L) 63.9 - 53.9 %   MCV 78.1 (L) 80.0 - 100.0 fL   MCH 26.7 26.0 - 34.0 pg   MCHC 34.1 30.0 - 36.0 g/dL   RDW 85.1 88.4 - 84.4 %   Platelets 317 150 - 400 K/uL   nRBC 0.0 0.0 - 0.2 %  Comprehensive metabolic panel with GFR   Collection Time: 09/15/24  2:25 AM  Result Value Ref Range   Sodium 135 135 - 145 mmol/L   Potassium 3.8 3.5 - 5.1 mmol/L   Chloride 106 98 - 111 mmol/L   CO2 19 (L) 22 - 32 mmol/L   Glucose, Bld 106 (H) 70 - 99 mg/dL   BUN 11 6 - 20 mg/dL   Creatinine, Ser 9.18 0.44 - 1.00 mg/dL   Calcium 9.1 8.9 - 89.6 mg/dL   Total Protein 7.0 6.5 - 8.1 g/dL   Albumin 2.8 (L) 3.5 - 5.0 g/dL   AST 46 (H) 15 - 41 U/L   ALT 107 (H) 0 - 44 U/L   Alkaline Phosphatase 136 (H) 38 - 126 U/L   Total Bilirubin 0.7 0.0 - 1.2 mg/dL   GFR, Estimated >39 >39  mL/min   Anion gap 10 5 - 15    Imaging Studies: US  MFM OB FOLLOW UP Result Date: 09/07/2024 ----------------------------------------------------------------------  OBSTETRICS REPORT                       (Signed Final 09/07/2024 04:53 pm) ---------------------------------------------------------------------- Patient Info  ID #:       [id number]                          D.O.B.:  [birth year] (32 yrs)(F)  Name:       Barbara Moore                   Visit Date: 09/07/2024 02:12 pm ---------------------------------------------------------------------- Performed By  Attending:        Delora Smaller DO       Ref. Address:     [facility]  Performed By:     Erminio Gentry            Secondary Phy.:   [facility] MAU/Triage                    RDMS  Referred  By:      SAVANNAH               [facility]                    BROWN NP                                 [facility] at                                                             [facility] ---------------------------------------------------------------------- Orders  #  Description                           Code        Ordered By  1  US  MFM OB FOLLOW UP                   [id number]    RAVI SHANKAR  2  US  MFM FETAL BPP WO NON               76819.01    RAVI [facility]     STRESS ----------------------------------------------------------------------  #  Order #                     Accession #                Episode #  1  [id number]                   [id number]                 [id number]  2  [id number]                   [id number]                 [id number] ---------------------------------------------------------------------- Indications  Obesity complicating pregnancy, third          O99.213  trimester (BMI 55)  Large for gestational age fetus affecting      O36.60X0  management of mother  Polyhydramnios, third trimester, antepartum    O40.3XX0  condition or complication, singleton fetus  Hypothyroid  (Thyroidectomy, hx thyroid          O99.280 E03.9  cancer - on Synthroid )  Sickle cell trait                              Z86.2  Poor obstetric history: Previous severe        O09.299  preeclampsia  LR NIPS - Female, Neg AFP  [redacted] weeks gestation of pregnancy                Z3A.34 ---------------------------------------------------------------------- Fetal  Evaluation  Num Of Fetuses:         1  Fetal Heart Rate(bpm):  145  Cardiac Activity:       Observed  Presentation:           Cephalic  Placenta:               Anterior  P. Cord Insertion:      Previously seen  Amniotic Fluid  AFI FV:      Subjectively upper-normal  AFI Sum(cm)     %Tile       Largest Pocket(cm)  23.98           91          7.74  RUQ(cm)       RLQ(cm)       LUQ(cm)        LLQ(cm)  6.33          3.36          7.74           6.55 ---------------------------------------------------------------------- Biophysical Evaluation  Amniotic F.V:   Within normal limits       F. Tone:        Observed  F. Movement:    Observed                   Score:          8/8  F. Breathing:   Observed ---------------------------------------------------------------------- Biometry  BPD:      92.7  mm     G. Age:  37w 5d       > 99  %    CI:        80.34   %    70 - 86                                                          FL/HC:      20.6   %    19.4 - 21.8  HC:      326.7  mm     G. Age:  37w 0d         85  %    HC/AC:      0.95        0.96 - 1.11  AC:       343   mm     G. Age:  38w 1d       > 99  %    FL/BPD:     72.7   %    71 - 87  FL:       67.4  mm     G. Age:  34w 5d         55  %    FL/AC:      19.7   %    20 - 24  LV:        2.9  mm  Est. FW:    3143  gm    6 lb 15 oz    > 99  %  Est. FW at 39 Wks:       4482  gm    9 lb 14 oz ---------------------------------------------------------------------- OB History  Gravidity:    2         Term:  1        Prem:   0        SAB:   0  TOP:          0       Ectopic:  0        Living: 1  ---------------------------------------------------------------------- Gestational Age  LMP:           34w 1d        Date:  01/12/24                   EDD:   10/18/24  U/S Today:     36w 6d                                        EDD:   09/29/24  Best:          34w 1d     Det. By:  LMP  (01/12/24)          EDD:   10/18/24 ---------------------------------------------------------------------- Anatomy  Cranium:               Previously seen        Aortic Arch:            Previously seen  Cavum:                 Previously seen        Ductal Arch:            Not well visualized  Ventricles:            Appears normal         Diaphragm:              Appears normal  Choroid Plexus:        Previously seen        Stomach:                Appears normal, left                                                                        sided  Cerebellum:            Previously seen        Abdomen:                Previously seen  Posterior Fossa:       Previously seen        Abdominal Wall:         Previously seen  Face:                  Orbits and profile     Cord Vessels:           Previously seen                         previously seen  Lips:                  Previously seen        Kidneys:  Appear normal  Thoracic:              Previously seen        Bladder:                Appears normal  Heart:                 Appears normal         Spine:                  Previously seen                         (4CH, axis, and                         situs)  RVOT:                  Previously seen        Upper Extremities:      Previously seen  LVOT:                  Not well visualized    Lower Extremities:      Previously seen ---------------------------------------------------------------------- Comments  Sonographic findings  Single intrauterine pregnancy at 34w 1d.  Fetal cardiac activity: Observed.  Presentation: Cephalic.  Interval fetal anatomy appears normal.  Fetal biometry shows the estimated fetal weight of 6 lb 15 oz,  3143g  (> 99%).  Amniotic fluid: Subjectively upper-normal. AFI: 23.98 cm.  MVP: 7.74 cm.  Placenta: Anterior.  BPP: 8/8.  There are limitations of prenatal ultrasound such as the  inability to detect certain abnormalities due to poor  visualization. Various factors such as fetal position,  gestational age and maternal body habitus may increase the  difficulty in visualizing the fetal anatomy.  Recommendations  - See Epic note for assessment and plan of care. Any  referring office that does not utilize Epic will recieve a copy of  today's consult note via fax. Please contact our office with  any concerns. ----------------------------------------------------------------------                  Delora Smaller, DO Electronically Signed Final Report   09/07/2024 04:53 pm ----------------------------------------------------------------------   US  MFM FETAL BPP WO NON STRESS Result Date: 09/07/2024 ----------------------------------------------------------------------  OBSTETRICS REPORT                       (Signed Final 09/07/2024 04:53 pm) ---------------------------------------------------------------------- Patient Info  ID #:       [id number]                          D.O.B.:  10-Aug-1991 (32 yrs)(F)  Name:       Barbara Moore                   Visit Date: 09/07/2024 02:12 pm ---------------------------------------------------------------------- Performed By  Attending:        Delora Smaller DO       Ref. Address:     [facility]  Performed By:     Erminio Gentry            Secondary Phy.:   [facility] MAU/Triage                    RDMS  Referred By:      NIDIA  Location[facility]         [facility]                    BROWN NP                                 Fetal Care at                                                             [facility]                                                             Women ---------------------------------------------------------------------- Orders  #  Description                           Code         Ordered By  1  US  MFM OB FOLLOW UP                   76816.01    RAVI SHANKAR  2  US  MFM FETAL BPP WO NON               76819.01    RAVI [facility]     STRESS ----------------------------------------------------------------------  #  Order #                     Accession #                Episode #  1  [id number]                   [id number]                 [id number]  2  [id number]                   [id number]                 [id number] ---------------------------------------------------------------------- Indications  Obesity complicating pregnancy, third          O99.213  trimester (BMI 55)  Large for gestational age fetus affecting      O36.60X0  management of mother  Polyhydramnios, third trimester, antepartum    O40.3XX0  condition or complication, singleton fetus  Hypothyroid (Thyroidectomy, hx thyroid          O99.280 E03.9  cancer - on Synthroid )  Sickle cell trait                              Z86.2  Poor obstetric history: Previous severe        O09.299  preeclampsia  LR NIPS - Female, Neg AFP  [redacted] weeks gestation of pregnancy                Z3A.34 ---------------------------------------------------------------------- Fetal Evaluation  Num Of Fetuses:         1  Fetal Heart Rate(bpm):  145  Cardiac Activity:       Observed  Presentation:           Cephalic  Placenta:               Anterior  P. Cord Insertion:      Previously seen  Amniotic Fluid  AFI FV:      Subjectively upper-normal  AFI Sum(cm)     %Tile       Largest Pocket(cm)  23.98           91          7.74  RUQ(cm)       RLQ(cm)       LUQ(cm)        LLQ(cm)  6.33          3.36          7.74           6.55 ---------------------------------------------------------------------- Biophysical Evaluation  Amniotic F.V:   Within normal limits       F. Tone:        Observed  F. Movement:    Observed                   Score:          8/8  F. Breathing:   Observed ---------------------------------------------------------------------- Biometry  BPD:      92.7  mm      G. Age:  37w 5d       > 99  %    CI:        80.34   %    70 - 86                                                          FL/HC:      20.6   %    19.4 - 21.8  HC:      326.7  mm     G. Age:  37w 0d         85  %    HC/AC:      0.95        0.96 - 1.11  AC:       343   mm     G. Age:  38w 1d       > 99  %    FL/BPD:     72.7   %    71 - 87  FL:       67.4  mm     G. Age:  34w 5d         55  %    FL/AC:      19.7   %    20 - 24  LV:        2.9  mm  Est. FW:    3143  gm    6 lb 15 oz    > 99  %  Est. FW at 39 Wks:       4482  gm    9 lb 14 oz ---------------------------------------------------------------------- OB History  Gravidity:    2         Term:   1        Prem:   0        SAB:  0  TOP:          0       Ectopic:  0        Living: 1 ---------------------------------------------------------------------- Gestational Age  LMP:           34w 1d        Date:  01/12/24                   EDD:   10/18/24  U/S Today:     36w 6d                                        EDD:   09/29/24  Best:          34w 1d     Det. By:  LMP  (01/12/24)          EDD:   10/18/24 ---------------------------------------------------------------------- Anatomy  Cranium:               Previously seen        Aortic Arch:            Previously seen  Cavum:                 Previously seen        Ductal Arch:            Not well visualized  Ventricles:            Appears normal         Diaphragm:              Appears normal  Choroid Plexus:        Previously seen        Stomach:                Appears normal, left                                                                        sided  Cerebellum:            Previously seen        Abdomen:                Previously seen  Posterior Fossa:       Previously seen        Abdominal Wall:         Previously seen  Face:                  Orbits and profile     Cord Vessels:           Previously seen                         previously seen  Lips:                  Previously seen        Kidneys:                 Appear normal  Thoracic:  Previously seen        Bladder:                Appears normal  Heart:                 Appears normal         Spine:                  Previously seen                         (4CH, axis, and                         situs)  RVOT:                  Previously seen        Upper Extremities:      Previously seen  LVOT:                  Not well visualized    Lower Extremities:      Previously seen ---------------------------------------------------------------------- Comments  Sonographic findings  Single intrauterine pregnancy at 34w 1d.  Fetal cardiac activity: Observed.  Presentation: Cephalic.  Interval fetal anatomy appears normal.  Fetal biometry shows the estimated fetal weight of 6 lb 15 oz,  3143g (> 99%).  Amniotic fluid: Subjectively upper-normal. AFI: 23.98 cm.  MVP: 7.74 cm.  Placenta: Anterior.  BPP: 8/8.  There are limitations of prenatal ultrasound such as the  inability to detect certain abnormalities due to poor  visualization. Various factors such as fetal position,  gestational age and maternal body habitus may increase the  difficulty in visualizing the fetal anatomy.  Recommendations  - See Epic note for assessment and plan of care. Any  referring office that does not utilize Epic will recieve a copy of  today's consult note via fax. Please contact our office with  any concerns. ----------------------------------------------------------------------                  Delora Smaller, DO Electronically Signed Final Report   09/07/2024 04:53 pm ----------------------------------------------------------------------     Assessment and Plan: #Elevated LFTs #[redacted] weeks gestation of pregnancy Mostly normotensive and asymptomatic but unexpectedly found to have significant LFT elevations without any other lab abnormalities, though Cr is creeping up. May be brewing atypical HELLP, discussed obs and repeat labs in AM with possibly delivery if continues to worsen, she is  amenable. Given 2L LR in MAU, will keep on maintenance IVF as well. - repeat CBC/CMP at 0800 - LR @ 125 cc/hr - cont ASA  #Hypothyroidism Cont synthroid    #Anxiety/depression Cont Effexor   #FWB FHT Cat I NST: Reactive  Donnice CHRISTELLA Carolus, MD, MPH, FAAFP Attending Family Medicine Physician, [facility], [facility]

## 2024-09-16 ENCOUNTER — Inpatient Hospital Stay (HOSPITAL_COMMUNITY): Admitting: Anesthesiology

## 2024-09-16 ENCOUNTER — Other Ambulatory Visit

## 2024-09-16 ENCOUNTER — Telehealth: Admitting: Obstetrics & Gynecology

## 2024-09-16 ENCOUNTER — Ambulatory Visit

## 2024-09-16 ENCOUNTER — Encounter (HOSPITAL_COMMUNITY): Payer: Self-pay | Admitting: Obstetrics and Gynecology

## 2024-09-16 ENCOUNTER — Encounter (HOSPITAL_COMMUNITY): Payer: Self-pay

## 2024-09-16 DIAGNOSIS — R7989 Other specified abnormal findings of blood chemistry: Secondary | ICD-10-CM | POA: Diagnosis not present

## 2024-09-16 DIAGNOSIS — O26893 Other specified pregnancy related conditions, third trimester: Secondary | ICD-10-CM

## 2024-09-16 LAB — COMPREHENSIVE METABOLIC PANEL WITH GFR
ALT: 100 U/L — ABNORMAL HIGH (ref 0–44)
AST: 45 U/L — ABNORMAL HIGH (ref 15–41)
Albumin: 2.6 g/dL — ABNORMAL LOW (ref 3.5–5.0)
Alkaline Phosphatase: 133 U/L — ABNORMAL HIGH (ref 38–126)
Anion gap: 8 (ref 5–15)
BUN: 10 mg/dL (ref 6–20)
CO2: 22 mmol/L (ref 22–32)
Calcium: 8.2 mg/dL — ABNORMAL LOW (ref 8.9–10.3)
Chloride: 105 mmol/L (ref 98–111)
Creatinine, Ser: 0.66 mg/dL (ref 0.44–1.00)
GFR, Estimated: >60 mL/min (ref >60)
Glucose, Bld: 114 mg/dL — ABNORMAL HIGH (ref 70–99)
Potassium: 3.9 mmol/L (ref 3.5–5.1)
Sodium: 135 mmol/L (ref 135–145)
Total Bilirubin: 0.6 mg/dL (ref 0.0–1.2)
Total Protein: 6.4 g/dL — ABNORMAL LOW (ref 6.5–8.1)

## 2024-09-16 LAB — CBC
HCT: 30.7 % — ABNORMAL LOW (ref 36.0–46.0)
HCT: 32.7 % — ABNORMAL LOW (ref 36.0–46.0)
Hemoglobin: 10.8 g/dL — ABNORMAL LOW (ref 12.0–15.0)
Hemoglobin: 11.5 g/dL — ABNORMAL LOW (ref 12.0–15.0)
MCH: 26.9 pg (ref 26.0–34.0)
MCH: 27.3 pg (ref 26.0–34.0)
MCHC: 35.2 g/dL (ref 30.0–36.0)
MCHC: 35.2 g/dL (ref 30.0–36.0)
MCV: 76.4 fL — ABNORMAL LOW (ref 80.0–100.0)
MCV: 77.5 fL — ABNORMAL LOW (ref 80.0–100.0)
Platelets: 320 K/uL (ref 150–400)
Platelets: 335 K/uL (ref 150–400)
RBC: 4.02 MIL/uL (ref 3.87–5.11)
RBC: 4.22 MIL/uL (ref 3.87–5.11)
RDW: 15 % (ref 11.5–15.5)
RDW: 15 % (ref 11.5–15.5)
WBC: 7.3 K/uL (ref 4.0–10.5)
WBC: 8.5 K/uL (ref 4.0–10.5)
nRBC: 0 % (ref 0.0–0.2)
nRBC: 0 % (ref 0.0–0.2)

## 2024-09-16 MED ORDER — ACETAMINOPHEN 325 MG PO TABS: 650.0000 mg | ORAL_TABLET | ORAL | Status: DC | PRN

## 2024-09-16 MED ORDER — DIPHENHYDRAMINE HCL 50 MG/ML IJ SOLN: 12.5000 mg | INTRAMUSCULAR | Status: DC | PRN

## 2024-09-16 MED ORDER — LACTATED RINGERS IV SOLN: INTRAVENOUS | Status: DC

## 2024-09-16 MED ORDER — TERBUTALINE SULFATE 1 MG/ML IJ SOLN: 0.2500 mg | Freq: Once | INTRAMUSCULAR | Status: DC | PRN

## 2024-09-16 MED ORDER — OXYTOCIN-SODIUM CHLORIDE 30-0.9 UT/500ML-% IV SOLN
1.0000 m[IU]/min | INTRAVENOUS | Status: DC
  Administered 2024-09-16: 2 m[IU]/min via INTRAVENOUS
  Filled 2024-09-16: qty 500

## 2024-09-16 MED ORDER — LACTATED RINGERS IV SOLN: 500.0000 mL | INTRAVENOUS | Status: DC | PRN

## 2024-09-16 MED ORDER — PHENYLEPHRINE 80 MCG/ML (10ML) SYRINGE FOR IV PUSH (FOR BLOOD PRESSURE SUPPORT): 80.0000 ug | PREFILLED_SYRINGE | INTRAVENOUS | Status: DC | PRN

## 2024-09-16 MED ORDER — FENTANYL-BUPIVACAINE-NACL 0.5-0.125-0.9 MG/250ML-% EP SOLN
12.0000 mL/h | EPIDURAL | Status: DC | PRN
  Administered 2024-09-16: 12 mL/h via EPIDURAL
  Filled 2024-09-16: qty 250

## 2024-09-16 MED ORDER — LIDOCAINE HCL (PF) 1 % IJ SOLN: 30.0000 mL | INTRAMUSCULAR | Status: DC | PRN

## 2024-09-16 MED ORDER — PENICILLIN G POT IN DEXTROSE 60000 UNIT/ML IV SOLN
3.0000 10*6.[IU] | INTRAVENOUS | Status: DC
  Administered 2024-09-16 (×2): 3 10*6.[IU] via INTRAVENOUS
  Filled 2024-09-16 (×4): qty 50

## 2024-09-16 MED ORDER — OXYTOCIN-SODIUM CHLORIDE 30-0.9 UT/500ML-% IV SOLN
2.5000 [IU]/h | INTRAVENOUS | Status: DC
  Filled 2024-09-16: qty 500

## 2024-09-16 MED ORDER — ONDANSETRON HCL 4 MG/2ML IJ SOLN: 4.0000 mg | Freq: Four times a day (QID) | INTRAMUSCULAR | Status: DC | PRN

## 2024-09-16 MED ORDER — FENTANYL CITRATE (PF) 100 MCG/2ML IJ SOLN: 50.0000 ug | INTRAMUSCULAR | Status: DC | PRN

## 2024-09-16 MED ORDER — LACTATED RINGERS IV SOLN: 500.0000 mL | Freq: Once | INTRAVENOUS | Status: DC

## 2024-09-16 MED ORDER — OXYTOCIN BOLUS FROM INFUSION
333.0000 mL | Freq: Once | INTRAVENOUS | Status: AC
  Administered 2024-09-17: 333 mL via INTRAVENOUS

## 2024-09-16 MED ORDER — EPHEDRINE 5 MG/ML INJ: 10.0000 mg | INTRAVENOUS | Status: DC | PRN

## 2024-09-16 MED ORDER — SOD CITRATE-CITRIC ACID 500-334 MG/5ML PO SOLN: 30.0000 mL | ORAL | Status: DC | PRN

## 2024-09-16 MED ORDER — LIDOCAINE HCL (PF) 1 % IJ SOLN
INTRAMUSCULAR | Status: DC | PRN
  Administered 2024-09-16: 5 mL via EPIDURAL

## 2024-09-16 MED ORDER — SODIUM CHLORIDE 0.9 % IV SOLN
5.0000 10*6.[IU] | Freq: Once | INTRAVENOUS | Status: AC
  Administered 2024-09-16: 5 10*6.[IU] via INTRAVENOUS
  Filled 2024-09-16: qty 5

## 2024-09-16 NOTE — Progress Notes (Signed)
 Patient Vitals for the past 4 hrs:  BP Temp Temp src Pulse Resp  09/16/24 1902 (!) 148/96 -- -- 95 15  09/16/24 1832 135/86 -- -- (!) 101 --  09/16/24 1825 -- 98.1 F (36.7 C) Oral -- --  09/16/24 1802 115/72 -- -- 99 16  09/16/24 1732 (!) 148/73 -- -- 89 --  09/16/24 1704 132/79 -- -- 94 17   Not having any pain at all. Ctx mild and irregular, pitocin  at 14 mu/min.  FHR Cat 1.  Cx 3/50/-2. AROM w/clear fluid

## 2024-09-16 NOTE — Progress Notes (Signed)
 Patient ID: Barbara Moore, female   DOB: May 23, 1991, 33 y.o.   MRN: [id number] FACULTY PRACTICE ANTEPARTUM(COMPREHENSIVE) NOTE  Barbara Moore is a 33 y.o. G2P1001 at [redacted]w[redacted]d by best clinical estimate who is admitted for  [facility] complicated by elevated LFTs.  Fetal presentation is cephalic. Length of Stay:  0  Days  Subjective: She denies HA/BV/RUQ pain.  Patient reports the fetal movement as active. Patient reports uterine contraction  activity as none. Patient reports  vaginal bleeding as none. Patient describes fluid per vagina as None.  Vitals:  Blood pressure 133/74, pulse 94, temperature 97.8 F (36.6 C), temperature source Oral, resp. rate 17, height 5' (1.524 m), weight (!) 155.9 kg, last menstrual period 01/12/2024, SpO2 94%, currently breastfeeding. Physical Examination: General appearance - alert, well appearing, and in no distress Heart - normal rate Pulm - Normal effort.  Abdomen - soft, nontender, nondistended Extremities: extremities normal, atraumatic, no cyanosis or edema and Homans sign is negative, no sign of DVT  Membranes:intact  Fetal Monitoring:   NST: 130s, mod var + accels, no decels Toco Quiet   Labs:  CBC    Component Value Date/Time   WBC 7.1 09/15/2024 0640   RBC 3.99 09/15/2024 0640   HGB 10.9 (L) 09/15/2024 0640   HGB 11.3 08/03/2024 0935   HCT 31.0 (L) 09/15/2024 0640   HCT 35.0 08/03/2024 0935   PLT 283 09/15/2024 0640   PLT 382 08/03/2024 0935   MCV 77.7 (L) 09/15/2024 0640   MCV 83 08/03/2024 0935   MCH 27.3 09/15/2024 0640   MCHC 35.2 09/15/2024 0640   RDW 14.8 09/15/2024 0640   RDW 15.3 08/03/2024 0935   LYMPHSABS 1.7 03/30/2024 1600   MONOABS 0.2 04/20/2023 1449   EOSABS 0.1 03/30/2024 1600   BASOSABS 0.0 03/30/2024 1600      Latest Ref Rng & Units 09/15/2024    6:40 AM 09/15/2024    2:25 AM 03/30/2024    4:00 PM  CMP  Glucose 70 - 99 mg/dL 91  893  893   BUN 6 - 20 mg/dL 10  11  8    Creatinine 0.44 - 1.00 mg/dL 9.19  9.18   9.42   Sodium 135 - 145 mmol/L 134  135  137   Potassium 3.5 - 5.1 mmol/L 3.7  3.8  3.8   Chloride 98 - 111 mmol/L 104  106  103   CO2 22 - 32 mmol/L 21  19  19    Calcium 8.9 - 10.3 mg/dL 8.7  9.1  9.4   Total Protein 6.5 - 8.1 g/dL 6.3  7.0  6.6   Total Bilirubin 0.0 - 1.2 mg/dL 0.7  0.7  <9.7   Alkaline Phos 38 - 126 U/L 133  136  95   AST 15 - 41 U/L 44  46  12   ALT 0 - 44 U/L 97  107  23    Medications:  Scheduled  aspirin  EC  162 mg Oral Daily   docusate sodium   100 mg Oral Daily   levothyroxine   250 mcg Oral QAC breakfast   oxytocin  40 units in LR 1000 mL  333 mL Intravenous Once   prenatal multivitamin  1 tablet Oral Q1200   valACYclovir   500 mg Oral BID   venlafaxine  XR  150 mg Oral Daily   I have reviewed the patient's current medications.  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 She reports she had an atypical presentation with her last delivery as well. Patient agreeable to IOL and whatever is safest for her and baby - Discussed Q12 hour labs while they are stable but if any change, would increase to Q6 hour - Monitor BP and treat severe range blood pressures - Discussed  Magnesium  in labor and then 24 hours after delivery  #h/o PPH - Plan medications available at time of delivery, I.e. TXA.   #HSV - Currently on Valtrex   #Macrosomia - 12/2 growth was 3143g, 6lb15oz, >99%. AC >99%ile. Normal 2h GTT.  She is tested to 3581g in 2020.  - She is well dated by LMP and 6 wk US   #Routine PNC  #Hypothyroidism - Continue Synthroid  - TSH yesterday was normal 1.7  #FWB - Reactive NST  Vina Solian 09/16/2024,11:28 AM

## 2024-09-16 NOTE — Anesthesia Procedure Notes (Signed)
 Epidural Patient location during procedure: OB Start time: 09/16/2024 10:56 PM End time: 09/16/2024 11:12 PM  Staffing Anesthesiologist: Jefm Garnette LABOR, MD Performed: anesthesiologist   Preanesthetic Checklist Completed: patient identified, IV checked, site marked, risks and benefits discussed, surgical consent, monitors and equipment checked, pre-op evaluation and timeout performed  Epidural Patient position: sitting Prep: DuraPrep and site prepped and draped Patient monitoring: continuous pulse ox and blood pressure Approach: midline Location: L3-L4 Injection technique: LOR air  Needle:  Needle type: Tuohy  Needle gauge: 17 G Needle length: 9 cm and 9 Needle insertion depth: 9 cm Catheter type: closed end flexible Catheter size: 19 Gauge Catheter at skin depth: 10 and 16 cm Test dose: negative  Assessment Events: blood not aspirated, no cerebrospinal fluid, injection not painful, no injection resistance, no paresthesia and negative IV test  Additional Notes Patient identified. Risks/Benefits/Options discussed with patient including but not limited to bleeding, infection, nerve damage, paralysis, failed block, incomplete pain control, headache, blood pressure changes, nausea, vomiting, reactions to medication both or allergic, itching and postpartum back pain. Confirmed with bedside nurse the patient's most recent platelet count. Confirmed with patient that they are not currently taking any anticoagulation, have any bleeding history or any family history of bleeding disorders. Patient expressed understanding and wished to proceed. All questions were answered. Sterile technique was used throughout the entire procedure. Please see nursing notes for vital signs. Test dose was given through epidural needle and negative prior to continuing to dose epidural or start infusion. Warning signs of high block given to the patient including shortness of breath, tingling/numbness in hands,  complete motor block, or any concerning symptoms with instructions to call for help. Patient was given instructions on fall risk and not to get out of bed. All questions and concerns addressed with instructions to call with any issues.  1 Attempt (S) . Patient tolerated procedure well.

## 2024-09-16 NOTE — Anesthesia Preprocedure Evaluation (Signed)
 Anesthesia Evaluation    Reviewed: Allergy & Precautions, Patient's Chart, lab work & pertinent test results  Airway Mallampati: II  TM Distance: >3 FB Neck ROM: Full    Dental no notable dental hx. (+) Teeth Intact, Dental Advisory Given   Pulmonary neg pulmonary ROS   Pulmonary exam normal breath sounds clear to auscultation       Cardiovascular hypertension, Normal cardiovascular exam Rhythm:Regular Rate:Normal     Neuro/Psych   Anxiety     negative neurological ROS  negative psych ROS   GI/Hepatic negative GI ROS, Neg liver ROS,,,  Endo/Other  Hypothyroidism  Class 4 obesity  Renal/GU negative Renal ROS     Musculoskeletal   Abdominal   Peds  Hematology Lab Results      Component                Value               Date                      WBC                      8.5                 09/16/2024                HGB                      11.5 (L)            09/16/2024                HCT                      32.7 (L)            09/16/2024                MCV                      77.5 (L)            09/16/2024                PLT                      335                 09/16/2024              Anesthesia Other Findings   Reproductive/Obstetrics (+) Pregnancy                              Anesthesia Physical Anesthesia Plan  ASA: 3  Anesthesia Plan: Epidural   Post-op Pain Management:    Induction:   PONV Risk Score and Plan:   Airway Management Planned:   Additional Equipment:   Intra-op Plan:   Post-operative Plan:   Informed Consent: I have reviewed the patients History and Physical, chart, labs and discussed the procedure including the risks, benefits and alternatives for the proposed anesthesia with the patient or authorized representative who has indicated his/her understanding and acceptance.       Plan Discussed with:   Anesthesia Plan Comments: (35.3 wk G2P1 w prE  and BMI 67 for LEA)  Anesthesia Quick Evaluation

## 2024-09-17 ENCOUNTER — Encounter (HOSPITAL_COMMUNITY): Payer: Self-pay | Admitting: Obstetrics and Gynecology

## 2024-09-17 DIAGNOSIS — O99214 Obesity complicating childbirth: Secondary | ICD-10-CM

## 2024-09-17 DIAGNOSIS — O1414 Severe pre-eclampsia complicating childbirth: Secondary | ICD-10-CM

## 2024-09-17 DIAGNOSIS — O403XX0 Polyhydramnios, third trimester, not applicable or unspecified: Secondary | ICD-10-CM

## 2024-09-17 DIAGNOSIS — O99284 Endocrine, nutritional and metabolic diseases complicating childbirth: Secondary | ICD-10-CM

## 2024-09-17 DIAGNOSIS — Z3A35 35 weeks gestation of pregnancy: Secondary | ICD-10-CM

## 2024-09-17 DIAGNOSIS — O99344 Other mental disorders complicating childbirth: Secondary | ICD-10-CM

## 2024-09-17 LAB — CBC
HCT: 29.3 % — ABNORMAL LOW (ref 36.0–46.0)
Hemoglobin: 10.3 g/dL — ABNORMAL LOW (ref 12.0–15.0)
MCH: 27.2 pg (ref 26.0–34.0)
MCHC: 35.2 g/dL (ref 30.0–36.0)
MCV: 77.5 fL — ABNORMAL LOW (ref 80.0–100.0)
Platelets: 272 K/uL (ref 150–400)
RBC: 3.78 MIL/uL — ABNORMAL LOW (ref 3.87–5.11)
RDW: 14.9 % (ref 11.5–15.5)
WBC: 7.7 K/uL (ref 4.0–10.5)
nRBC: 0 % (ref 0.0–0.2)

## 2024-09-17 LAB — COMPREHENSIVE METABOLIC PANEL WITH GFR
ALT: 105 U/L — ABNORMAL HIGH (ref 0–44)
AST: 53 U/L — ABNORMAL HIGH (ref 15–41)
Albumin: 2.6 g/dL — ABNORMAL LOW (ref 3.5–5.0)
Alkaline Phosphatase: 134 U/L — ABNORMAL HIGH (ref 38–126)
Anion gap: 9 (ref 5–15)
BUN: 8 mg/dL (ref 6–20)
CO2: 17 mmol/L — ABNORMAL LOW (ref 22–32)
Calcium: 7.9 mg/dL — ABNORMAL LOW (ref 8.9–10.3)
Chloride: 107 mmol/L (ref 98–111)
Creatinine, Ser: 0.67 mg/dL (ref 0.44–1.00)
GFR, Estimated: >60 mL/min (ref >60)
Glucose, Bld: 88 mg/dL (ref 70–99)
Potassium: 5.2 mmol/L — ABNORMAL HIGH (ref 3.5–5.1)
Sodium: 133 mmol/L — ABNORMAL LOW (ref 135–145)
Total Bilirubin: 0.7 mg/dL (ref 0.0–1.2)
Total Protein: 6.3 g/dL — ABNORMAL LOW (ref 6.5–8.1)

## 2024-09-17 LAB — MAGNESIUM: Magnesium: 4.3 mg/dL — ABNORMAL HIGH (ref 1.7–2.4)

## 2024-09-17 LAB — CULTURE, BETA STREP (GROUP B ONLY)

## 2024-09-17 LAB — SYPHILIS: RPR W/REFLEX TO RPR TITER AND TREPONEMAL ANTIBODIES, TRADITIONAL SCREENING AND DIAGNOSIS ALGORITHM: RPR Ser Ql: NONREACTIVE

## 2024-09-17 MED ORDER — DIPHENHYDRAMINE HCL 25 MG PO CAPS: 25.0000 mg | ORAL_CAPSULE | Freq: Four times a day (QID) | ORAL | Status: DC | PRN

## 2024-09-17 MED ORDER — FLEET ENEMA RE ENEM: 1.0000 | ENEMA | Freq: Every day | RECTAL | Status: DC | PRN

## 2024-09-17 MED ORDER — TETANUS-DIPHTH-ACELL PERTUSSIS 5-2-15.5 LF-MCG/0.5 IM SUSP: 0.5000 mL | Freq: Once | INTRAMUSCULAR | Status: DC

## 2024-09-17 MED ORDER — SENNOSIDES-DOCUSATE SODIUM 8.6-50 MG PO TABS
2.0000 | ORAL_TABLET | ORAL | Status: DC
  Filled 2024-09-17 (×5): qty 2

## 2024-09-17 MED ORDER — MEASLES, MUMPS & RUBELLA VAC ~~LOC~~ SUSR: 0.5000 mL | Freq: Once | SUBCUTANEOUS | Status: DC

## 2024-09-17 MED ORDER — MAGNESIUM SULFATE 40 GM/1000ML IV SOLN
2.0000 g/h | INTRAVENOUS | Status: AC
  Administered 2024-09-17: 2 g/h via INTRAVENOUS
  Filled 2024-09-17: qty 1000

## 2024-09-17 MED ORDER — TRANEXAMIC ACID-NACL 1000-0.7 MG/100ML-% IV SOLN: 1000.0000 mg | INTRAVENOUS | Status: DC

## 2024-09-17 MED ORDER — MISOPROSTOL 200 MCG PO TABS
800.0000 ug | ORAL_TABLET | Freq: Once | ORAL | Status: AC
  Administered 2024-09-17: 800 ug via RECTAL

## 2024-09-17 MED ORDER — WITCH HAZEL-GLYCERIN EX PADS: 1.0000 | MEDICATED_PAD | CUTANEOUS | Status: DC | PRN

## 2024-09-17 MED ORDER — COCONUT OIL OIL: 1.0000 | TOPICAL_OIL | Status: DC | PRN

## 2024-09-17 MED ORDER — TRANEXAMIC ACID-NACL 1000-0.7 MG/100ML-% IV SOLN
INTRAVENOUS | Status: AC
  Administered 2024-09-17: 1000 mg
  Filled 2024-09-17: qty 100

## 2024-09-17 MED ORDER — OXYCODONE HCL 5 MG PO TABS
5.0000 mg | ORAL_TABLET | ORAL | Status: DC | PRN
  Administered 2024-09-17: 5 mg via ORAL
  Filled 2024-09-17: qty 1

## 2024-09-17 MED ORDER — ONDANSETRON HCL 4 MG/2ML IJ SOLN: 4.0000 mg | INTRAMUSCULAR | Status: DC | PRN

## 2024-09-17 MED ORDER — METHYLERGONOVINE MALEATE 0.2 MG/ML IJ SOLN: 0.2000 mg | INTRAMUSCULAR | Status: DC | PRN

## 2024-09-17 MED ORDER — FERROUS SULFATE 325 (65 FE) MG PO TABS
325.0000 mg | ORAL_TABLET | ORAL | Status: DC
  Administered 2024-09-17 – 2024-09-19 (×2): 325 mg via ORAL
  Filled 2024-09-17 (×2): qty 1

## 2024-09-17 MED ORDER — LACTATED RINGERS IV SOLN: INTRAVENOUS | Status: AC

## 2024-09-17 MED ORDER — IBUPROFEN 600 MG PO TABS
600.0000 mg | ORAL_TABLET | Freq: Four times a day (QID) | ORAL | Status: DC
  Administered 2024-09-17 – 2024-09-18 (×6): 600 mg via ORAL
  Filled 2024-09-17 (×8): qty 1

## 2024-09-17 MED ORDER — METHYLERGONOVINE MALEATE 0.2 MG PO TABS: 0.2000 mg | ORAL_TABLET | ORAL | Status: DC | PRN

## 2024-09-17 MED ORDER — ACETAMINOPHEN 325 MG PO TABS: 650.0000 mg | ORAL_TABLET | ORAL | Status: DC | PRN

## 2024-09-17 MED ORDER — ONDANSETRON HCL 4 MG PO TABS: 4.0000 mg | ORAL_TABLET | ORAL | Status: DC | PRN

## 2024-09-17 MED ORDER — BISACODYL 10 MG RE SUPP: 10.0000 mg | Freq: Every day | RECTAL | Status: DC | PRN

## 2024-09-17 MED ORDER — SIMETHICONE 80 MG PO CHEW: 80.0000 mg | CHEWABLE_TABLET | ORAL | Status: DC | PRN

## 2024-09-17 MED ORDER — PRENATAL MULTIVITAMIN CH: 1.0000 | ORAL_TABLET | Freq: Every day | ORAL | Status: DC

## 2024-09-17 MED ORDER — DIBUCAINE (PERIANAL) 1 % EX OINT: 1.0000 | TOPICAL_OINTMENT | CUTANEOUS | Status: DC | PRN

## 2024-09-17 MED ORDER — MISOPROSTOL 200 MCG PO TABS
ORAL_TABLET | ORAL | Status: AC
  Filled 2024-09-17: qty 4

## 2024-09-17 MED ORDER — BENZOCAINE-MENTHOL 20-0.5 % EX AERO
1.0000 | INHALATION_SPRAY | CUTANEOUS | Status: DC | PRN
  Filled 2024-09-17: qty 56

## 2024-09-17 MED ORDER — MEDROXYPROGESTERONE ACETATE 150 MG/ML IM SUSP: 150.0000 mg | INTRAMUSCULAR | Status: DC | PRN

## 2024-09-17 NOTE — Lactation Note (Signed)
 This note was copied from a baby's chart. Lactation Consultation Note  Patient Name: Barbara Moore Unijb'd Date: 09/17/2024 Age:32 hours Reason for consult: Initial assessment;1st time breastfeeding;Late-preterm 34-36.6wks;Other (Comment);Maternal endocrine disorder (GHTN on MgSO4)  LC in to visit with P2 Mom of LPTI [name] delivered vaginally.  Mom on magnesium  sulfate and states baby has latched twice for 10 minutes.  Baby has voided once already.  Baby has not been supplemented as of yet.  No latch scores for breastfeeding, but Mom describes baby opening his mouth wide onto areola.  Currently baby is swaddled and sleeping quietly next to Mom.    LC talked about the importance of supporting breastfeeding with a baby born at [redacted]w[redacted]d but supplementing and pumping as a bridge to exclusive breastfeeding.  LC reviewed breast massage and hand expression, colostrum easily expressed onto nipple.  LC provided Mom with a pumping top and assisted her to pump for the first time.  Colostrum protectors reviewed.    1st glucose was 54 and lab here to draw second.  Mom took breastfeeding class and understands colostrum and mature milk and the importance of stimulating her milk supply due to baby being a LPTI.  Reviewed probable increased sleepiness later today and next few days due to gestation.   LPT crib card reviewed and placed in baby's crib.  Plan recommended- 1-STS with baby often 2- Offer the breast often with cues 3- at least every 3 hrs, awaken baby if needed for feeding 4- supplement per volume guidelines > 14 ml EBM+/formula 5- ask for help prn  Maternal Data Has patient been taught Hand Expression?: Yes Does the patient have breastfeeding experience prior to this delivery?: Yes How long did the patient breastfeed?: 2 weeks  Lactation Tools Discussed/Used Tools: Pump;Flanges;Hands-free pumping top Flange Size: 21 Breast pump type: Double-Electric Breast Pump Pump Education:  Setup, frequency, and cleaning;Milk Storage Reason for Pumping: support milk supply/LPTI Pumping frequency: Initiated double pumping at 6 hrs post delivery, Mom encouraged to pump after breastfeeding, or 8 times per 24 hrs.  Interventions Interventions: Breast feeding basics reviewed;Skin to skin;Breast massage;Hand express;DEBP;Education;LC Services brochure;CDC Guidelines for Breast Pump Cleaning;LPT handout/interventions  Discharge Pump: Hands Free;Personal (Medela hands free from insurance)  Consult Status Consult Status: Follow-up Date: 09/18/24 Follow-up type: In-patient    [name] 09/17/2024, 11:35 AM

## 2024-09-17 NOTE — Plan of Care (Signed)
°  Problem: Clinical Measurements: Goal: Complications related to the disease process, condition or treatment will be avoided or minimized Outcome: Progressing   Problem: Education: Goal: Knowledge of General Education information will improve Description: Including pain rating scale, medication(s)/side effects and non-pharmacologic comfort measures Outcome: Progressing   Problem: Health Behavior/Discharge Planning: Goal: Ability to manage health-related needs will improve Outcome: Progressing   Problem: Clinical Measurements: Goal: Ability to maintain clinical measurements within normal limits will improve Outcome: Progressing Goal: Will remain free from infection Outcome: Progressing Goal: Diagnostic test results will improve Outcome: Progressing Goal: Respiratory complications will improve Outcome: Progressing Goal: Cardiovascular complication will be avoided Outcome: Progressing   Problem: Activity: Goal: Risk for activity intolerance will decrease Outcome: Progressing   Problem: Nutrition: Goal: Adequate nutrition will be maintained Outcome: Progressing   Problem: Coping: Goal: Level of anxiety will decrease Outcome: Progressing   Problem: Elimination: Goal: Will not experience complications related to bowel motility Outcome: Progressing Goal: Will not experience complications related to urinary retention Outcome: Progressing   Problem: Pain Managment: Goal: General experience of comfort will improve and/or be controlled Outcome: Progressing   Problem: Safety: Goal: Ability to remain free from injury will improve Outcome: Progressing   Problem: Skin Integrity: Goal: Risk for impaired skin integrity will decrease Outcome: Progressing   Problem: Education: Goal: Knowledge of disease or condition will improve Outcome: Progressing Goal: Knowledge of the prescribed therapeutic regimen will improve Outcome: Progressing   Problem: Fluid Volume: Goal:  Peripheral tissue perfusion will improve Outcome: Progressing   Problem: Clinical Measurements: Goal: Complications related to disease process, condition or treatment will be avoided or minimized Outcome: Progressing   Problem: Education: Goal: Knowledge of Childbirth will improve Outcome: Progressing Goal: Ability to make informed decisions regarding treatment and plan of care will improve Outcome: Progressing Goal: Ability to state and carry out methods to decrease the pain will improve Outcome: Progressing Goal: Individualized Educational Video(s) Outcome: Progressing   Problem: Coping: Goal: Ability to verbalize concerns and feelings about labor and delivery will improve Outcome: Progressing   Problem: Life Cycle: Goal: Ability to make normal progression through stages of labor will improve Outcome: Progressing Goal: Ability to effectively push during vaginal delivery will improve Outcome: Progressing   Problem: Role Relationship: Goal: Will demonstrate positive interactions with the child Outcome: Progressing   Problem: Safety: Goal: Risk of complications during labor and delivery will decrease Outcome: Progressing   Problem: Pain Management: Goal: Relief or control of pain from uterine contractions will improve Outcome: Progressing   Problem: Education: Goal: Knowledge of condition will improve Outcome: Progressing Goal: Individualized Educational Video(s) Outcome: Progressing Goal: Individualized Newborn Educational Video(s) Outcome: Progressing   Problem: Activity: Goal: Will verbalize the importance of balancing activity with adequate rest periods Outcome: Progressing Goal: Ability to tolerate increased activity will improve Outcome: Progressing   Problem: Coping: Goal: Ability to identify and utilize available resources and services will improve Outcome: Progressing   Problem: Life Cycle: Goal: Chance of risk for complications during the postpartum  period will decrease Outcome: Progressing   Problem: Role Relationship: Goal: Ability to demonstrate positive interaction with newborn will improve Outcome: Progressing   Problem: Skin Integrity: Goal: Demonstration of wound healing without infection will improve Outcome: Progressing

## 2024-09-17 NOTE — Discharge Summary (Signed)
 Postpartum Discharge Summary  Patient Name: Barbara Moore DOB: 11/22/1990 MRN: [id number]  Date of admission: 09/15/2024 Delivery date:09/17/2024 Delivering provider: NEWTON MERING Date of discharge: 09/19/2024  Admitting diagnosis: Elevated LFTs [R79.89] Intrauterine pregnancy: [redacted]w[redacted]d     Secondary diagnosis:  Principal Problem:   Elevated LFTs Active Problems:   HSV-2 infection   Obesity in pregnancy, antepartum, third trimester   Post-operative hypothyroidism   Anxiety   Unwanted fertility   Excessive fetal growth affecting management of mother, antepartum   Polyhydramnios   History of pre-eclampsia   History of postpartum hemorrhage   Pre-eclampsia in third trimester   [redacted] weeks gestation of pregnancy   Vaginal delivery  Additional problems: None    Discharge diagnosis: Preterm Pregnancy Delivered                                              Post partum procedures:none Augmentation: AROM and Pitocin  Complications: None  Hospital course: Induction of Labor With Vaginal Delivery   33 y.o. yo [id number] at [redacted]w[redacted]d was admitted to the hospital 09/15/2024 for induction of labor.  Indication for induction: elevated LFTs.  Membrane Rupture Time/Date: 8:45 PM,09/16/2024  Delivery Method:Vaginal, Spontaneous Operative Delivery:N/A Episiotomy: None Lacerations:  None Details of delivery can be found in separate delivery note.  Patient had a postpartum course complicated by: Preeclampsia with SF (LFTs) - received Mag x 24h PP. She remained normotensive postpartum without medications and LFTs improved with only very mild elevation in ALT (58) on day of discharge. She will receive lasix  x 5d PP (K rx not sent due to K > 4). Patient is discharged home 09/19/2024.  Newborn Data: Birth date:09/17/2024 Birth time:4:32 AM Gender:Female Living status:Living Apgars:6 ,8  Weight:3410 g  Magnesium  Sulfate received: Yes: Seizure prophylaxis BMZ received:  No Rhophylac:N/A MMR:N/A T-DaP:Given prenatally Flu: Yes RSV Vaccine received: No Transfusion:No  Immunizations received: Immunization History  Administered Date(s) Administered   DTP 11/23/1992, 01/26/1993, 05/18/1993, 02/19/1994   DTaP 10/22/1996   HIB, Unspecified 11/23/1992, 01/26/1993, 05/18/1993, 02/19/1994   Hep B, Unspecified Jul 19, 1992, 11/23/1992, 05/18/1993   Hepatitis A, Ped/Adol-2 Dose 03/27/2010   Influenza, Seasonal, Injecte, Preservative Fre 08/26/2023   Influenza,inj,Quad PF,6+ Mos 10/23/2018, 07/21/2021   MMR 09/24/1993, 10/22/1996   Meningococcal Conjugate 03/27/2010   Moderna Sars-Covid-2 Vaccination 02/03/2020   OPV 11/23/1992, 01/26/1993, 05/18/1993, 10/22/1996   Tdap 03/27/2010, 02/19/2019, 08/03/2024    Physical exam  Vitals:   09/18/24 1750 09/18/24 1959 09/18/24 2333 09/19/24 0302  BP: 131/65 137/60 121/63 130/69  Pulse:  88 83 87  Resp: 17 17 17 18   Temp: 98.1 F (36.7 C) 98.9 F (37.2 C) 98 F (36.7 C) 98.1 F (36.7 C)  TempSrc: Oral Oral Oral Oral  SpO2: 99% 100% 98% 98%  Weight:      Height:       General: alert, cooperative, and no distress Lochia: appropriate Uterine Fundus: firm Incision: N/A DVT Evaluation: No evidence of DVT seen on physical exam. Labs: Lab Results  Component Value Date   WBC 7.7 09/17/2024   HGB 10.3 (L) 09/17/2024   HCT 29.3 (L) 09/17/2024   MCV 77.5 (L) 09/17/2024   PLT 272 09/17/2024      Latest Ref Rng & Units 09/19/2024    4:19 AM  CMP  Glucose 70 - 99 mg/dL 90   BUN 6 - 20 mg/dL 14  Creatinine 0.44 - 1.00 mg/dL 9.26   Sodium 864 - 854 mmol/L 138   Potassium 3.5 - 5.1 mmol/L 4.0   Chloride 98 - 111 mmol/L 107   CO2 22 - 32 mmol/L 20   Calcium  8.9 - 10.3 mg/dL 9.2   Total Protein 6.5 - 8.1 g/dL 6.0   Total Bilirubin 0.0 - 1.2 mg/dL 0.5   Alkaline Phos 38 - 126 U/L 99   AST 15 - 41 U/L 25   ALT 0 - 44 U/L 58    Edinburgh Score:    09/17/2024   11:20 PM  Edinburgh Postnatal Depression  Scale Screening Tool  I have been able to laugh and see the funny side of things. 0  I have looked forward with enjoyment to things. 0  I have blamed myself unnecessarily when things went wrong. 1  I have been anxious or worried for no good reason. 1  I have felt scared or panicky for no good reason. 0  Things have been getting on top of me. 0  I have been so unhappy that I have had difficulty sleeping. 0  I have felt sad or miserable. 0  I have been so unhappy that I have been crying. 0  The thought of harming myself has occurred to me. 0  Edinburgh Postnatal Depression Scale Total 2   No data recorded  After visit meds:  Allergies as of 09/19/2024       Reactions   Lactose Intolerance (gi) Diarrhea, Nausea And Vomiting   Other Other (See Comments)   n/a  Lactose intolerant        Medication List     STOP taking these medications    aspirin  EC 81 MG tablet       TAKE these medications    ferrous sulfate  325 (65 FE) MG EC tablet Take 1 tablet (325 mg total) by mouth every other day. What changed: when to take this   furosemide  20 MG tablet Commonly known as: LASIX  Take 1 tablet (20 mg total) by mouth daily.   ibuprofen  600 MG tablet Commonly known as: ADVIL  Take 1 tablet (600 mg total) by mouth every 6 (six) hours.   levothyroxine  25 MCG tablet Commonly known as: Synthroid  Take 1 tablet (25 mcg total) by mouth daily before breakfast. In addition to 200 mcg daily to make 225 mcg daily.   multivitamin-prenatal 27-0.8 MG Tabs tablet Take 1 tablet by mouth daily at 12 noon.   senna-docusate 8.6-50 MG tablet Commonly known as: Senokot-S Take 2 tablets by mouth daily. Start taking on: September 20, 2024   venlafaxine  XR 150 MG 24 hr capsule Commonly known as: EFFEXOR -XR Take 1 capsule (150 mg total) by mouth daily.         Discharge home in stable condition Infant Feeding: Breast and formula Infant Disposition:home with mother vs rooming in (not yet  determined at this time) Discharge instruction: per After Visit Summary and Postpartum booklet. Activity: Advance as tolerated. Pelvic rest for 6 weeks.  Diet: routine diet Future Appointments: Future Appointments  Date Time Provider Department Center  09/23/2024  3:30 PM [facility] PROVIDER 1 [facility]  09/23/2024  3:45 PM [facility] US3 [facility]  09/24/2024  9:40 AM [facility] NURSE [facility]  09/28/2024  2:15 PM [facility] PROVIDER 1 [facility]  09/28/2024  2:30 PM [facility] US4 [facility]  10/05/2024  2:15 PM [facility] PROVIDER 1 [facility]  10/05/2024  2:30 PM [facility] US4 [facility]  10/13/2024  11:00 AM Donah Darryle RAMAN, PT [facility] None  10/18/2024 11:00 AM Donah Darryle RAMAN, PT [facility] None  10/20/2024  2:35 PM Anyanwu, Gloris LABOR, MD [facility]   Follow up Visit:  Follow-up Information     Center for [facility]. Go on 09/24/2024.   Specialty: Obstetrics and Gynecology Why: For blood pressure check Contact information: [address] [place]  [address] [phone]        Center for [facility]. Go on 10/20/2024.   Specialty: Obstetrics and Gynecology Why: For routine postpartum care Contact information: [address] [place]   [address] [phone]                 Please schedule this patient for a In person postpartum visit in 4 weeks with the following provider: Any provider. Additional Postpartum F/U:BP check 1 week  Low risk pregnancy complicated by: HTN Delivery mode:  Vaginal, Spontaneous Anticipated Birth Control:  plansBTL done PP   09/19/2024 Kieth JAYSON Carolin, MD

## 2024-09-17 NOTE — Anesthesia Postprocedure Evaluation (Signed)
 Anesthesia Post Note  Patient: Yaniris Braddock  Procedure(s) Performed: AN AD HOC LABOR EPIDURAL     Patient location during evaluation: OB High Risk Anesthesia Type: Epidural Level of consciousness: awake and alert Pain management: pain level controlled Vital Signs Assessment: post-procedure vital signs reviewed and stable Respiratory status: spontaneous breathing, nonlabored ventilation and respiratory function stable Cardiovascular status: stable Postop Assessment: no headache, no backache, epidural receding, no apparent nausea or vomiting, patient able to bend at knees, able to ambulate and adequate PO intake Anesthetic complications: no   No notable events documented.  Last Vitals:  Vitals:   09/17/24 0630 09/17/24 0728  BP: (!) 145/65 (!) 107/57  Pulse: 83 86  Resp: 18 17  Temp: 36.5 C 36.5 C  SpO2: 96% 98%    Last Pain:  Vitals:   09/17/24 0728  TempSrc: Oral  PainSc:    Pain Goal: Patients Stated Pain Goal: 0 (09/15/24 0736)                 Thomos Janetta Lout

## 2024-09-18 MED ORDER — FUROSEMIDE 20 MG PO TABS
20.0000 mg | ORAL_TABLET | Freq: Two times a day (BID) | ORAL | Status: DC
  Administered 2024-09-18 – 2024-09-19 (×3): 20 mg via ORAL
  Filled 2024-09-18 (×2): qty 1

## 2024-09-18 MED ORDER — LIDOCAINE 5 % EX PTCH
1.0000 | MEDICATED_PATCH | CUTANEOUS | Status: DC
  Administered 2024-09-18: 1 via TRANSDERMAL
  Filled 2024-09-18: qty 1

## 2024-09-18 MED ORDER — FUROSEMIDE 20 MG PO TABS
20.0000 mg | ORAL_TABLET | Freq: Two times a day (BID) | ORAL | Status: DC
  Filled 2024-09-18: qty 1

## 2024-09-18 NOTE — Plan of Care (Signed)
°  Problem: Clinical Measurements: Goal: Complications related to the disease process, condition or treatment will be avoided or minimized Outcome: Progressing   Problem: Education: Goal: Knowledge of General Education information will improve Description: Including pain rating scale, medication(s)/side effects and non-pharmacologic comfort measures Outcome: Progressing   Problem: Health Behavior/Discharge Planning: Goal: Ability to manage health-related needs will improve Outcome: Progressing   Problem: Clinical Measurements: Goal: Ability to maintain clinical measurements within normal limits will improve Outcome: Progressing Goal: Will remain free from infection Outcome: Progressing Goal: Diagnostic test results will improve Outcome: Progressing Goal: Respiratory complications will improve Outcome: Progressing Goal: Cardiovascular complication will be avoided Outcome: Progressing   Problem: Activity: Goal: Risk for activity intolerance will decrease Outcome: Progressing   Problem: Nutrition: Goal: Adequate nutrition will be maintained Outcome: Progressing   Problem: Coping: Goal: Level of anxiety will decrease Outcome: Progressing   Problem: Elimination: Goal: Will not experience complications related to bowel motility Outcome: Progressing Goal: Will not experience complications related to urinary retention Outcome: Progressing   Problem: Pain Managment: Goal: General experience of comfort will improve and/or be controlled Outcome: Progressing   Problem: Safety: Goal: Ability to remain free from injury will improve Outcome: Progressing   Problem: Skin Integrity: Goal: Risk for impaired skin integrity will decrease Outcome: Progressing   Problem: Education: Goal: Knowledge of disease or condition will improve Outcome: Progressing Goal: Knowledge of the prescribed therapeutic regimen will improve Outcome: Progressing   Problem: Fluid Volume: Goal:  Peripheral tissue perfusion will improve Outcome: Progressing   Problem: Clinical Measurements: Goal: Complications related to disease process, condition or treatment will be avoided or minimized Outcome: Progressing   Problem: Education: Goal: Knowledge of Childbirth will improve Outcome: Progressing Goal: Ability to make informed decisions regarding treatment and plan of care will improve Outcome: Progressing Goal: Ability to state and carry out methods to decrease the pain will improve Outcome: Progressing Goal: Individualized Educational Video(s) Outcome: Progressing   Problem: Coping: Goal: Ability to verbalize concerns and feelings about labor and delivery will improve Outcome: Progressing   Problem: Life Cycle: Goal: Ability to make normal progression through stages of labor will improve Outcome: Progressing Goal: Ability to effectively push during vaginal delivery will improve Outcome: Progressing   Problem: Role Relationship: Goal: Will demonstrate positive interactions with the child Outcome: Progressing   Problem: Safety: Goal: Risk of complications during labor and delivery will decrease Outcome: Progressing   Problem: Pain Management: Goal: Relief or control of pain from uterine contractions will improve Outcome: Progressing   Problem: Education: Goal: Knowledge of condition will improve Outcome: Progressing Goal: Individualized Educational Video(s) Outcome: Progressing Goal: Individualized Newborn Educational Video(s) Outcome: Progressing   Problem: Activity: Goal: Will verbalize the importance of balancing activity with adequate rest periods Outcome: Progressing Goal: Ability to tolerate increased activity will improve Outcome: Progressing   Problem: Coping: Goal: Ability to identify and utilize available resources and services will improve Outcome: Progressing   Problem: Life Cycle: Goal: Chance of risk for complications during the postpartum  period will decrease Outcome: Progressing   Problem: Role Relationship: Goal: Ability to demonstrate positive interaction with newborn will improve Outcome: Progressing   Problem: Skin Integrity: Goal: Demonstration of wound healing without infection will improve Outcome: Progressing

## 2024-09-18 NOTE — Progress Notes (Signed)
 [name] was referred for history of depression/anxiety. * Referral screened out by Clinical Social Worker because none of the following criteria appear to apply: ~ History of anxiety/depression during this pregnancy, or of post-partum depression following prior delivery. ~ Diagnosis of anxiety and/or depression within last 3 years OR * [name]'s symptoms currently being treated with medication and/or therapy. [name] is prescribed and taking Effexor  XR 150 mg daily. Edinburgh score = 2. Please contact the Clinical Social Worker if needs arise, by [facility] request, or if [name] scores greater than 9/yes to question 10 on Edinburgh Postpartum Depression Screen.  Signed,  Sharyne LOIS Roulette, MSW, LCSWA, LCASA 09/18/2024 9:00 AM

## 2024-09-18 NOTE — Lactation Note (Signed)
 This note was copied from a baby's chart. Lactation Consultation Note  Patient Name: Barbara Moore Unijb'd Date: 09/18/2024 Age:32 hours Reason for consult: Follow-up assessment;Mother's request;1st time breastfeeding;Late-preterm 34-36.6wks;Maternal endocrine disorder;Breastfeeding assistance;RN request  P2- Infant was born at [redacted]w[redacted]d GA weighing 3410g and has had a total weight loss of 5.14%. RN requested feeding assistance because she felt like he has had poor feedings since birth. [name] reports that infant has been feeding well from both the breast and bottle, but admits to infant spitting up often. During the first 7 minutes of consult, infant was choking on thick fluid, but [name] was able to assist him with clearing it. Infant was placed on the left breast in the football hold. [name] stroked her breast and after a few attempts, infant was able to latch. LC reviewed how to flange infant's lips. Infant needed frequent stimulation to sustain sucking motion, but nursed on and off for 15 minutes. LC then encouraged formula feeding infant with the purple nipple that was provided by the SLP. In 15 minutes, infant was able to take 13 mL of formula. LC reminded [name] to keep the entire feeding 30 minutes or less to conserve energy. [name] verbalized understanding. LC encouraged [name] to call for further assistance as needed.  Maternal Data Has patient been taught Hand Expression?: Yes Does the patient have breastfeeding experience prior to this delivery?: No How long did the patient breastfeed?: [name] reports not really  Feeding Mother's Current Feeding Choice: Breast Milk Nipple Type: Nfant Slow Flow (purple)  LATCH Score Latch: Repeated attempts needed to sustain latch, nipple held in mouth throughout feeding, stimulation needed to elicit sucking reflex.  Audible Swallowing: A few with stimulation  Type of Nipple: Everted at rest and after stimulation  Comfort (Breast/Nipple): Soft / non-tender  Hold  (Positioning): Assistance needed to correctly position infant at breast and maintain latch.  LATCH Score: 7   Lactation Tools Discussed/Used Tools: Pump;Flanges Breast pump type: Double-Electric Breast Pump;Manual Pump Education: Setup, frequency, and cleaning;Milk Storage Reason for Pumping: LPI infant Pumping frequency: 15-20 min every 3 hrs  Interventions Interventions: Breast feeding basics reviewed;Assisted with latch;Hand express;Breast compression;Adjust position;Support pillows;Position options;Hand pump;DEBP;Education;Pace feeding;LC Services brochure;LPT handout/interventions  Discharge Discharge Education: Engorgement and breast care;Warning signs for feeding baby;Outpatient recommendation Pump: Hands Free;Personal  Consult Status Consult Status: Follow-up Date: 09/19/24 Follow-up type: In-patient    Recardo Hoit BS, IBCLC 09/18/2024, 7:32 PM

## 2024-09-18 NOTE — Progress Notes (Signed)
 Post Partum Day 1 s/p SVD at [redacted]w[redacted]d s/p IOL for severe preeclampsia (^ LFTs)  Subjective: No complaints, up ad lib, voiding, tolerating PO, and + flatus.  Baby is at bedside, having mild respiratory issues and being evaluated by Pediatric team.  Patient denies any headaches, visual symptoms, RUQ/epigastric pain or other concerning symptoms. Moderate lochia reported. Objective: Blood pressure 119/62, pulse 81, temperature 97.8 F (36.6 C), temperature source Oral, resp. rate 16, height 5' (1.524 m), weight (!) 155.9 kg, last menstrual period 01/12/2024, SpO2 97%, unknown if currently breastfeeding.  Patient Vitals for the past 24 hrs:  BP Temp Temp src Pulse Resp SpO2  09/18/24 0828 119/62 97.8 F (36.6 C) Oral 81 16 97 %  09/18/24 0447 127/79 97.7 F (36.5 C) Oral 82 16 97 %  09/18/24 0343 -- -- -- -- 17 --  09/18/24 0241 -- -- -- -- 16 --  09/18/24 0140 -- -- -- -- 17 --  09/18/24 0010 -- -- -- -- 16 --  09/17/24 2332 (!) 145/55 98.5 F (36.9 C) Oral 90 17 97 %  09/17/24 2250 -- -- -- -- 18 --  09/17/24 2118 -- -- -- -- 16 --  09/17/24 2032 -- -- -- -- 18 --  09/17/24 2008 125/61 98.5 F (36.9 C) Oral 85 18 98 %  09/17/24 1920 -- -- -- -- 19 --  09/17/24 1810 -- -- -- -- 17 --  09/17/24 1700 -- -- -- -- 19 --  09/17/24 1616 -- -- -- -- 20 --  09/17/24 1542 123/67 98.1 F (36.7 C) Oral 88 18 98 %  09/17/24 1440 -- -- -- -- 18 --  09/17/24 1330 -- -- -- -- 19 --  09/17/24 1225 139/66 97.7 F (36.5 C) Oral -- 18 98 %  09/17/24 1100 -- -- -- -- 20 --    Physical Exam:  General: alert and cooperative Lochia: appropriate Uterine Fundus: firm, NT DVT Evaluation: No evidence of DVT seen on physical exam. Negative Homan's sign. 2+ BLE edema  Labs:    Latest Ref Rng & Units 09/17/2024    9:43 AM 09/16/2024    8:29 PM 09/16/2024    1:13 PM  CBC  WBC 4.0 - 10.5 K/uL 7.7  8.5  7.3   Hemoglobin 12.0 - 15.0 g/dL 89.6  88.4  89.1   Hematocrit 36.0 - 46.0 % 29.3  32.7  30.7    Platelets 150 - 400 K/uL 272  335  320       Latest Ref Rng & Units 09/17/2024    1:44 AM 09/16/2024    1:13 PM 09/15/2024    6:40 AM  CMP  Glucose 70 - 99 mg/dL 88  885  91   BUN 6 - 20 mg/dL 8  10  10    Creatinine 0.44 - 1.00 mg/dL 9.32  9.33  9.19   Sodium 135 - 145 mmol/L 133  135  134   Potassium 3.5 - 5.1 mmol/L 5.2  3.9  3.7   Chloride 98 - 111 mmol/L 107  105  104   CO2 22 - 32 mmol/L 17  22  21    Calcium  8.9 - 10.3 mg/dL 7.9  8.2  8.7   Total Protein 6.5 - 8.1 g/dL 6.3  6.4  6.3   Total Bilirubin 0.0 - 1.2 mg/dL 0.7  0.6  0.7   Alkaline Phos 38 - 126 U/L 134  133  133   AST 15 - 41 U/L 53  45  44   ALT 0 - 44 U/L 105  100  97     Assessment/Plan: Stable BP, no current antihypertensives, will start as needed. Lasix  started for edema, will follow up labs for tomorrow especially given elevated K. No K supplement ordered for now. Recommended OOB, oral analgesia as needed. Breastfeeding, will get Lactation consult, and Contraception - interval BTL Patient desires circumcision for her female infant.  It was emphasized that this is an elective procedure; will only be done once baby is stable and pending approval by the pediatrician team (especially given current respiratory issues). Circumcision procedure details discussed, risks and benefits of procedure were also discussed.  These include but are not limited to: Benefits of circumcision in men include reduction in the rates of urinary tract infection (UTI), penile cancer, some sexually transmitted infections, penile inflammatory and retractile disorders, as well as easier hygiene.  Risks include bleeding , infection, injury of glans which may lead to penile deformity or urinary tract issues, unsatisfactory cosmetic appearance and other potential complications related to the procedure.   Patient wants to proceed with circumcision; written informed consent obtained.  Patient was told circumcision will be performed prior to infant's  discharge pending approval by the pediatrician team, routine circumcision and post circumcision care ordered for the infant. Plan for discharge tomorrow if remains stable. Continue routine postpartum care    LOS: 2 days   Gloris Hugger, MD 09/18/2024, 10:22 AM

## 2024-09-19 ENCOUNTER — Other Ambulatory Visit (HOSPITAL_COMMUNITY): Payer: Self-pay

## 2024-09-19 LAB — COMPREHENSIVE METABOLIC PANEL WITH GFR
ALT: 58 U/L — ABNORMAL HIGH (ref 0–44)
AST: 25 U/L (ref 15–41)
Albumin: 2.3 g/dL — ABNORMAL LOW (ref 3.5–5.0)
Alkaline Phosphatase: 99 U/L (ref 38–126)
Anion gap: 11 (ref 5–15)
BUN: 14 mg/dL (ref 6–20)
CO2: 20 mmol/L — ABNORMAL LOW (ref 22–32)
Calcium: 9.2 mg/dL (ref 8.9–10.3)
Chloride: 107 mmol/L (ref 98–111)
Creatinine, Ser: 0.73 mg/dL (ref 0.44–1.00)
GFR, Estimated: >60 mL/min (ref >60)
Glucose, Bld: 90 mg/dL (ref 70–99)
Potassium: 4 mmol/L (ref 3.5–5.1)
Sodium: 138 mmol/L (ref 135–145)
Total Bilirubin: 0.5 mg/dL (ref 0.0–1.2)
Total Protein: 6 g/dL — ABNORMAL LOW (ref 6.5–8.1)

## 2024-09-19 MED ORDER — IBUPROFEN 600 MG PO TABS
600.0000 mg | ORAL_TABLET | Freq: Four times a day (QID) | ORAL | 0 refills | Status: AC
  Filled 2024-09-19: qty 30, 8d supply, fill #0

## 2024-09-19 MED ORDER — FUROSEMIDE 20 MG PO TABS
20.0000 mg | ORAL_TABLET | Freq: Every day | ORAL | 0 refills | Status: AC
  Filled 2024-09-19: qty 5, 5d supply, fill #0

## 2024-09-19 MED ORDER — FERROUS SULFATE 325 (65 FE) MG PO TBEC
325.0000 mg | DELAYED_RELEASE_TABLET | ORAL | 0 refills | Status: AC
  Filled 2024-09-19: qty 90, 180d supply, fill #0

## 2024-09-19 MED ORDER — SENNOSIDES-DOCUSATE SODIUM 8.6-50 MG PO TABS
2.0000 | ORAL_TABLET | ORAL | 0 refills | Status: AC
  Filled 2024-09-19: qty 30, 15d supply, fill #0

## 2024-09-20 LAB — SURGICAL PATHOLOGY

## 2024-09-20 NOTE — Lactation Note (Addendum)
 This note was copied from a baby's chart. Lactation Consultation Note  Patient Name: Barbara Moore Date: 09/20/2024 Age:32 hours Reason for consult: Follow-up assessment;Late-preterm 34-36.6wks;Maternal endocrine disorder;Other (Comment);Infant weight loss;Hyperbilirubinemia  LC in to visit with P2 Mom of LPTI [name].  Baby's weight increased by 22 gm at weight this morning, baby remains at 8.5% weight loss, output 2 voids in last 24 hrs.    Mom trying to feed baby formula when LC arrived.  Baby too sleepy to breastfeed.  Baby took 10 ml.  Mom concerned that baby isn't tolerating the formula due to emesis and minimal stools.  Mom reports to have not pumped in over a day.  Breasts are feeling heavier today.  LC provided another pumping band and sized Mom with 18 mm flanges.  Milk is flowing.  LC tried to feed baby from the bottle while Mom was pumping.  Baby very fatigued and only swallowed once while pace feeding, took another 2 ml.  LC talked about hyperbilirubinemia, being a LPTI and weight loss contributing to baby's fatigue. LC encouraged Mom to pump and bottle feed primarily today to try to increase baby's weight, lower bilirubin and increase her milk supply.  Mom encouraged to hold baby STS.  At least every 3 hrs, Mom is to wake baby for a feeding of EBM by paced bottle.  Mom is to pump on maintain mode every 3 hrs.    Reassured Mom that this was temporary bridge to exclusive breastfeeding, but it is important to supplement (SLP Dacia in to consult) and pump regularly.   Feeding Nipple Type: Nfant Extra Slow Flow (gold)  LATCH Score Latch: Too sleepy or reluctant, no latch achieved, no sucking elicited.  Audible Swallowing: None  Type of Nipple: Everted at rest and after stimulation  Comfort (Breast/Nipple): Soft / non-tender  Hold (Positioning): No assistance needed to correctly position infant at breast.  LATCH Score: 6   Lactation Tools  Discussed/Used Tools: Pump;Flanges;Bottle;Hands-free pumping top Flange Size: 18 Breast pump type: Double-Electric Breast Pump Pump Education: Setup, frequency, and cleaning Reason for Pumping: LPT infant, weight loss and hyperbilirubinemia Pumping frequency: inconsistent, no pumping yesterday and 2 on Saturday.  Encouraged pumping at least every 3 hrs Pumped volume: 0 mL 25 ml this am  Interventions Interventions: Breast feeding basics reviewed;Skin to skin;Breast massage;Hand express;DEBP;Pace feeding  Discharge WIC Program: Yes  Consult Status Consult Status: Follow-up Date: 09/21/24 Follow-up type: In-patient    [name] 09/20/2024, 11:29 AM

## 2024-09-21 ENCOUNTER — Encounter: Admitting: Advanced Practice Midwife

## 2024-09-21 NOTE — Lactation Note (Signed)
 This note was copied from a baby's chart. Lactation Consultation Note  Patient Name: Barbara Moore Date: 09/21/2024 Age:32 days Reason for consult: Follow-up assessment;Late-preterm 34-36.6wks;Infant weight loss;Other (Comment) (GHTN)  LC in to visit with P2 Mom of LPTI Barbara Moore.  Baby noted to be at a 10.3% weight loss this morning, a loss of 62 gms from day before.  Mom feels baby is doing much better and taking larger volumes by bottle.  Mom reports that baby had just breast fed for 10 mins hearing gulps at the breast.    Mom currently pace feeding baby her EBM using a Dr. Delores preemie nipple.  Mom states she feels comfortable.  Baby looking very tired and tone was poor with baby's head flopping around with some regurgitation noted.  Mom had breast fed baby an hour prior and now feeding a bottle.  RN notified of LC concern.  Mom reports to be pumping 6 times in last 24 hrs, volume slowly increasing to 15-35 ml.    Lactation Tools Discussed/Used Tools: Pump;Flanges;Bottle;Hands-free pumping top Flange Size: 18 Breast pump type: Double-Electric Breast Pump Pump Education: Setup, frequency, and cleaning;Milk Storage Reason for Pumping: support milk supply/LPTI/weight loss Pumping frequency: encouraged to pump at each feeding, or 8 times per 24 hrs Pumped volume: 25 mL (25-40 ml)  Interventions Interventions: Breast feeding basics reviewed;Skin to skin;Breast massage;Hand express;DEBP;Education  Discharge Discharge Education: Engorgement and breast care;Outpatient recommendation Pump:  ([facility]fered [facility] loan[facility] on DC, [facility] appt 12/17 to obtain a pump) WIC Program: Yes  Consult Status Consult Status: Follow-up Date: 09/22/24 Follow-up type: In-patient    [name] RN IBCLC 09/21/2024, 11:19 AM

## 2024-09-22 NOTE — Lactation Note (Signed)
 This note was copied from a baby's chart. Lactation Consultation Note  Patient Name: Barbara Moore Unijb'd Date: 09/22/2024 Age:32 days Reason for consult: Follow-up assessment;Late-preterm 34-36.6wks;1st time breastfeeding;Infant weight loss (- 8.80% WL)  Visited with family of 19 28/4 weeks old AGA female [name]; [name] is a P2 but this is her first time breastfeeding. She reported she's been primarily pumping and bottle feeding at the hospital but she's open to take baby to breast later on once they are at home. She's been pumping consistently, her supply continues to increase, praised her for all her efforts.  Couplet is getting discharged from [facility] today. Reviewed discharge education and the importance of consistent pumping whenever baby is taking a bottle for the prevention of engorgement and to protect her supply. Referral to [facility] sent for [name] OP F/U. She's going to the [facility] office to P/U her pump at 3 pm today. No other support person at this time. All questions and concerns answered, family to contact [name] services PRN.  Feeding Mother's Current Feeding Choice: Breast Milk and Formula Nipple Type: Dr. Jonna Galli  Lactation Tools Discussed/Used Tools: Pump;Flanges;Hands-free pumping top;Coconut oil Flange Size: 18 Breast pump type: Double-Electric Breast Pump Pump Education: Setup, frequency, and cleaning;Milk Storage Reason for Pumping: LPI, support milk supply, PPH of 900 cc Pumping frequency: 8 times/24 hours Pumped volume: 40 mL (40-60 ml)  Interventions Interventions: Breast feeding basics reviewed;Coconut oil;DEBP;Education  Discharge Discharge Education: Engorgement and breast care;Warning signs for feeding baby;Outpatient recommendation;Outpatient Epic message sent  Consult Status Consult Status: Complete Date: 09/22/24 Follow-up type: Call as needed    Linken Mcglothen GORMAN Crate 09/22/2024, 1:25 PM

## 2024-09-23 ENCOUNTER — Other Ambulatory Visit

## 2024-09-24 ENCOUNTER — Ambulatory Visit: Payer: Self-pay

## 2024-09-27 ENCOUNTER — Telehealth (HOSPITAL_COMMUNITY): Payer: Self-pay

## 2024-09-27 ENCOUNTER — Encounter: Payer: Self-pay | Admitting: General Practice

## 2024-09-27 ENCOUNTER — Encounter: Admitting: Obstetrics and Gynecology

## 2024-09-27 NOTE — Telephone Encounter (Signed)
 09/27/2024 1001  Name: Aiyannah Fayad MRN: [id number] DOB: [birth year]  Reason for Call:  Transition of Care Hospital Discharge Call  Contact Status: Patient Contact Status: Message  Language assistant needed:          Follow-Up Questions:    Van Postnatal Depression Scale:  In the Past 7 Days:    PHQ2-9 Depression Scale:     Discharge Follow-up:    Post-discharge interventions: NA  Signature  Rosaline Deretha PEAK

## 2024-09-28 ENCOUNTER — Other Ambulatory Visit

## 2024-09-28 ENCOUNTER — Telehealth (HOSPITAL_COMMUNITY): Payer: Self-pay

## 2024-09-28 ENCOUNTER — Encounter

## 2024-09-28 NOTE — Telephone Encounter (Signed)
 09/28/2024 1154  Name: Barbara Moore MRN: [id number] DOB: [birth year]  Reason for Call:  Transition of Care Hospital Discharge Call  Contact Status: Patient Contact Status: Complete  Language assistant needed:          Follow-Up Questions: Do You Have Any Concerns About Your Health As You Heal From Delivery?: No Do You Have Any Concerns About Your Infants Health?: Yes What Concerns Do You Have About Your Baby?: Patient has concerns about infants feeding and weight gain. Patient states that she just saw pediatrician today and discussed feedings and weight gain. RN told patient to follow pediatrician feeding recommendations. RN provided information about in person breastfeeding support group and will also e-mail this information to patient. Patient has no other concerns or questions.  Edinburgh Postnatal Depression Scale:  In the Past 7 Days: I have been able to laugh and see the funny side of things.: As much as I always could I have looked forward with enjoyment to things.: As much as I ever did I have blamed myself unnecessarily when things went wrong.: Yes, some of the time I have been anxious or worried for no good reason.: Yes, sometimes I have felt scared or panicky for no good reason.: No, not at all Things have been getting on top of me.: No, most of the time I have coped quite well I have been so unhappy that I have had difficulty sleeping.: Not at all I have felt sad or miserable.: No, not at all I have been so unhappy that I have been crying.: No, never The thought of harming myself has occurred to me.: Never Edinburgh Postnatal Depression Scale Total: 5  PHQ2-9 Depression Scale:     Discharge Follow-up: Edinburgh score requires follow up?: No Patient was advised of the following resources:: Breastfeeding Support Group, Support Group  Post-discharge interventions: Reviewed Newborn Safe Sleep Practices  Signature  Rosaline Deretha PEAK

## 2024-10-02 ENCOUNTER — Telehealth (HOSPITAL_COMMUNITY): Payer: Self-pay

## 2024-10-02 NOTE — Telephone Encounter (Signed)
 Duplicate encounter.   Signe Colt

## 2024-10-05 ENCOUNTER — Ambulatory Visit

## 2024-10-05 ENCOUNTER — Other Ambulatory Visit

## 2024-10-05 ENCOUNTER — Encounter: Admitting: Obstetrics and Gynecology

## 2024-10-12 ENCOUNTER — Encounter: Admitting: Obstetrics and Gynecology

## 2024-10-13 ENCOUNTER — Ambulatory Visit: Admitting: Physical Therapy

## 2024-10-18 ENCOUNTER — Ambulatory Visit: Admitting: Physical Therapy

## 2024-10-19 ENCOUNTER — Encounter: Admitting: Certified Nurse Midwife

## 2024-10-20 ENCOUNTER — Other Ambulatory Visit: Payer: Self-pay | Admitting: Obstetrics & Gynecology

## 2024-10-20 ENCOUNTER — Other Ambulatory Visit: Payer: Self-pay

## 2024-10-20 ENCOUNTER — Encounter: Payer: Self-pay | Admitting: Obstetrics & Gynecology

## 2024-10-20 ENCOUNTER — Ambulatory Visit: Payer: Self-pay | Admitting: Obstetrics & Gynecology

## 2024-10-20 VITALS — BP 137/87 | HR 101 | Wt 325.2 lb

## 2024-10-20 DIAGNOSIS — E890 Postprocedural hypothyroidism: Secondary | ICD-10-CM

## 2024-10-20 MED ORDER — LEVOTHYROXINE SODIUM 125 MCG PO TABS: 250.0000 ug | ORAL_TABLET | Freq: Every day | ORAL | 1 refills | Status: DC

## 2024-10-20 NOTE — Patient Instructions (Signed)
 Premedicate with 1000 mg of Tylenol  and 800 mg of Ibuprofen ; one hour before IUD appointment

## 2024-10-20 NOTE — Progress Notes (Signed)
 "  Post Partum Visit Note  Barbara Moore is a 34 y.o. G36P1102 female who presents for a postpartum visit. She is 4.5 weeks postpartum following a normal spontaneous vaginal delivery.  I have fully reviewed the prenatal and intrapartum course. The delivery was at 35.4 gestational weeks.  Anesthesia: epidural. Postpartum course has been good. Baby is doing well. Baby is feeding by breast. Bleeding no bleeding. Bowel function is normal. Bladder function is normal. Patient is not sexually active. Contraception method is none getting tubal done. Postpartum depression screening: negative.    Edinburgh Postnatal Depression Scale - 10/20/24 1502       Edinburgh Postnatal Depression Scale:  In the Past 7 Days   I have been able to laugh and see the funny side of things. 0    I have looked forward with enjoyment to things. 0    I have blamed myself unnecessarily when things went wrong. 0    I have been anxious or worried for no good reason. 0    I have felt scared or panicky for no good reason. 0    Things have been getting on top of me. 0    I have been so unhappy that I have had difficulty sleeping. 0    I have felt sad or miserable. 0    I have been so unhappy that I have been crying. 0    The thought of harming myself has occurred to me. 0    Edinburgh Postnatal Depression Scale Total 0          Health Maintenance Due  Topic Date Due   Pneumococcal Vaccine (1 of 2 - PCV) Never done   HPV VACCINES (1 - Risk 3-dose SCDM series) Never done   COVID-19 Vaccine (2 - Moderna risk series) 03/02/2020   Influenza Vaccine  05/07/2024    The following portions of the patient's history were reviewed and updated as appropriate: allergies, current medications, past family history, past medical history, past social history, past surgical history, and problem list.  Review of Systems Pertinent items noted in HPI and remainder of comprehensive ROS otherwise negative.  Objective:  BP 137/87   Pulse  (!) 101   Wt (!) 325 lb 3.2 oz (147.5 kg)   LMP 01/12/2024 (Approximate)   Breastfeeding Yes   BMI 63.51 kg/m    General:  alert and no distress   Breasts:  not indicated  Lungs: clear to auscultation bilaterally  Heart:  regular rate and rhythm  Abdomen: soft, non-tender; bowel sounds normal; no masses,  no organomegaly   GU exam:  not indicated       Assessment:   Normal postpartum exam.   Plan:   Essential components of care per ACOG recommendations:  1.  Mood and well being: Patient with negative depression screening today. Reviewed local resources for support.  - Patient tobacco use? No.   - hx of drug use? No.    2. Infant care and feeding:  -Patient currently breastmilk feeding? Yes. Reviewed importance of draining breast regularly to support lactation.  -Social determinants of health (SDOH) reviewed in EPIC. Worried about car.  3. Sexuality, contraception and birth spacing - Patient does not want a pregnancy in the next year.  Desired family size is 2 children.  - Reviewed reproductive life planning. Reviewed contraceptive methods based on pt preferences and effectiveness.  Patient desired IUD or IUS today after counseling, will return for placement - Discussed birth spacing of 18 months  4. Sleep and fatigue -Encouraged family/partner/community support of 4 hrs of uninterrupted sleep to help with mood and fatigue  5. Physical Recovery  - Discussed patients delivery and complications. She describes her labor as good. - Patient had a Vaginal, no problems at delivery. Patient had no laceration. Perineal healing reviewed. Patient expressed understanding - Patient has urinary incontinence? No. - Patient is safe to resume physical and sexual activity  6.  Health Maintenance - HM due items addressed Yes - Last pap smear normal on 01/30/2022 -Breast Cancer screening indicated? No.   7. Hypothyroidism - Follow up with Endocrinologist - Continue Synthroid , this was  prescribed - levothyroxine  (SYNTHROID ) 125 MCG tablet; Take 2 tablets (250 mcg total) by mouth daily before breakfast. In addition to 200 mcg daily to make 225 mcg daily.  Dispense: 60 tablet; Refill: 1   Gloris Hugger, MD [facility] [employer], [facility]  "

## 2024-11-04 ENCOUNTER — Ambulatory Visit: Payer: Self-pay | Admitting: Obstetrics and Gynecology
